# Patient Record
Sex: Female | Born: 1935
Health system: Southern US, Community
[De-identification: ages and names within clinical notes are randomized; demographics above are authoritative.]

## PROBLEM LIST (undated history)

## (undated) DIAGNOSIS — R002 Palpitations: Secondary | ICD-10-CM

## (undated) DIAGNOSIS — D369 Benign neoplasm, unspecified site: Secondary | ICD-10-CM

## (undated) DIAGNOSIS — E7800 Pure hypercholesterolemia, unspecified: Secondary | ICD-10-CM

## (undated) DIAGNOSIS — K589 Irritable bowel syndrome without diarrhea: Secondary | ICD-10-CM

## (undated) DIAGNOSIS — I729 Aneurysm of unspecified site: Secondary | ICD-10-CM

## (undated) DIAGNOSIS — H269 Unspecified cataract: Secondary | ICD-10-CM

## (undated) DIAGNOSIS — E559 Vitamin D deficiency, unspecified: Secondary | ICD-10-CM

## (undated) DIAGNOSIS — F329 Major depressive disorder, single episode, unspecified: Secondary | ICD-10-CM

## (undated) DIAGNOSIS — K5730 Diverticulosis of large intestine without perforation or abscess without bleeding: Secondary | ICD-10-CM

## (undated) DIAGNOSIS — I10 Essential (primary) hypertension: Secondary | ICD-10-CM

## (undated) DIAGNOSIS — K219 Gastro-esophageal reflux disease without esophagitis: Secondary | ICD-10-CM

## (undated) DIAGNOSIS — F0390 Unspecified dementia without behavioral disturbance: Secondary | ICD-10-CM

## (undated) HISTORY — DX: Unspecified cataract: H26.9

## (undated) HISTORY — DX: Gastro-esophageal reflux disease without esophagitis: K21.9

## (undated) HISTORY — DX: Major depressive disorder, single episode, unspecified: F32.9

## (undated) HISTORY — PX: APPENDECTOMY: SHX54

## (undated) HISTORY — DX: Vitamin D deficiency, unspecified: E55.9

## (undated) HISTORY — PX: OTHER SURGICAL HISTORY: SHX169

## (undated) HISTORY — DX: Benign neoplasm, unspecified site: D36.9

## (undated) HISTORY — PX: CATARACT EXTRACTION: SUR2

## (undated) HISTORY — DX: Irritable bowel syndrome without diarrhea: K58.9

## (undated) HISTORY — DX: Essential (primary) hypertension: I10

## (undated) HISTORY — DX: Aneurysm of unspecified site: I72.9

## (undated) HISTORY — DX: Pure hypercholesterolemia, unspecified: E78.00

## (undated) HISTORY — DX: Diverticulosis of large intestine without perforation or abscess without bleeding: K57.30

---

## 1991-04-06 HISTORY — PX: EXPLORATORY LAPAROTOMY: SUR591

## 1992-03-21 ENCOUNTER — Encounter: Payer: Self-pay | Admitting: Internal Medicine

## 1998-06-30 ENCOUNTER — Other Ambulatory Visit: Admission: RE | Admit: 1998-06-30 | Discharge: 1998-06-30 | Payer: Self-pay | Admitting: Nurse Practitioner

## 1998-08-06 ENCOUNTER — Encounter: Admission: RE | Admit: 1998-08-06 | Discharge: 1998-11-04 | Payer: Self-pay | Admitting: Rheumatology

## 1999-03-11 ENCOUNTER — Encounter: Payer: Self-pay | Admitting: Internal Medicine

## 2002-10-19 ENCOUNTER — Encounter: Payer: Self-pay | Admitting: Internal Medicine

## 2002-10-19 ENCOUNTER — Inpatient Hospital Stay (HOSPITAL_COMMUNITY): Admission: AD | Admit: 2002-10-19 | Discharge: 2002-10-21 | Payer: Self-pay | Admitting: Internal Medicine

## 2004-05-25 ENCOUNTER — Ambulatory Visit: Payer: Self-pay | Admitting: Internal Medicine

## 2004-09-07 ENCOUNTER — Ambulatory Visit: Payer: Self-pay | Admitting: Endocrinology

## 2004-09-25 ENCOUNTER — Ambulatory Visit: Payer: Self-pay | Admitting: Internal Medicine

## 2004-10-09 ENCOUNTER — Ambulatory Visit: Payer: Self-pay | Admitting: Endocrinology

## 2005-06-17 ENCOUNTER — Ambulatory Visit: Payer: Self-pay | Admitting: Internal Medicine

## 2005-06-18 ENCOUNTER — Encounter (INDEPENDENT_AMBULATORY_CARE_PROVIDER_SITE_OTHER): Payer: Self-pay | Admitting: *Deleted

## 2005-06-18 ENCOUNTER — Encounter: Admission: RE | Admit: 2005-06-18 | Discharge: 2005-06-18 | Payer: Self-pay | Admitting: Cardiology

## 2005-08-06 ENCOUNTER — Ambulatory Visit: Payer: Self-pay | Admitting: Internal Medicine

## 2006-02-04 ENCOUNTER — Ambulatory Visit (HOSPITAL_COMMUNITY): Admission: RE | Admit: 2006-02-04 | Discharge: 2006-02-04 | Payer: Self-pay | Admitting: Internal Medicine

## 2006-02-04 ENCOUNTER — Encounter: Payer: Self-pay | Admitting: Internal Medicine

## 2006-02-14 ENCOUNTER — Ambulatory Visit: Payer: Self-pay | Admitting: Internal Medicine

## 2006-02-22 ENCOUNTER — Ambulatory Visit (HOSPITAL_COMMUNITY): Admission: RE | Admit: 2006-02-22 | Discharge: 2006-02-22 | Payer: Self-pay | Admitting: Internal Medicine

## 2006-02-22 ENCOUNTER — Encounter: Payer: Self-pay | Admitting: Internal Medicine

## 2006-03-31 ENCOUNTER — Ambulatory Visit: Payer: Self-pay | Admitting: Internal Medicine

## 2006-03-31 ENCOUNTER — Ambulatory Visit: Payer: Self-pay | Admitting: Endocrinology

## 2006-04-28 ENCOUNTER — Ambulatory Visit: Payer: Self-pay | Admitting: Endocrinology

## 2006-07-28 ENCOUNTER — Ambulatory Visit: Payer: Self-pay | Admitting: Endocrinology

## 2006-07-28 LAB — CONVERTED CEMR LAB
Albumin: 4 g/dL (ref 3.5–5.2)
Basophils Absolute: 0 10*3/uL (ref 0.0–0.1)
Cholesterol: 232 mg/dL (ref 0–200)
Direct LDL: 119.3 mg/dL
Eosinophils Absolute: 0.2 10*3/uL (ref 0.0–0.6)
GFR calc non Af Amer: 88 mL/min
HCT: 38.4 % (ref 36.0–46.0)
HDL: 87.1 mg/dL (ref >39.0)
Hemoglobin: 12.9 g/dL (ref 12.0–15.0)
Hgb A1c MFr Bld: 5.9 % (ref 4.6–6.0)
MCHC: 33.6 g/dL (ref 30.0–36.0)
MCV: 82.3 fL (ref 78.0–100.0)
Monocytes Absolute: 0.6 10*3/uL (ref 0.2–0.7)
Neutrophils Relative %: 64.6 % (ref 43.0–77.0)
Potassium: 4.2 meq/L (ref 3.5–5.1)
RBC: 4.66 M/uL (ref 3.87–5.11)
Sodium: 136 meq/L (ref 135–145)
Total CHOL/HDL Ratio: 2.7

## 2006-12-06 DIAGNOSIS — R1013 Epigastric pain: Secondary | ICD-10-CM

## 2006-12-06 DIAGNOSIS — K3189 Other diseases of stomach and duodenum: Secondary | ICD-10-CM

## 2006-12-06 DIAGNOSIS — K5730 Diverticulosis of large intestine without perforation or abscess without bleeding: Secondary | ICD-10-CM

## 2006-12-06 DIAGNOSIS — K219 Gastro-esophageal reflux disease without esophagitis: Secondary | ICD-10-CM

## 2006-12-06 DIAGNOSIS — K589 Irritable bowel syndrome without diarrhea: Secondary | ICD-10-CM

## 2006-12-06 DIAGNOSIS — R7309 Other abnormal glucose: Secondary | ICD-10-CM

## 2006-12-06 DIAGNOSIS — R109 Unspecified abdominal pain: Secondary | ICD-10-CM | POA: Insufficient documentation

## 2006-12-06 DIAGNOSIS — F329 Major depressive disorder, single episode, unspecified: Secondary | ICD-10-CM

## 2006-12-06 DIAGNOSIS — F418 Other specified anxiety disorders: Secondary | ICD-10-CM | POA: Insufficient documentation

## 2006-12-06 DIAGNOSIS — F3289 Other specified depressive episodes: Secondary | ICD-10-CM

## 2006-12-06 HISTORY — DX: Gastro-esophageal reflux disease without esophagitis: K21.9

## 2006-12-06 HISTORY — DX: Irritable bowel syndrome, unspecified: K58.9

## 2006-12-06 HISTORY — DX: Major depressive disorder, single episode, unspecified: F32.9

## 2006-12-06 HISTORY — DX: Diverticulosis of large intestine without perforation or abscess without bleeding: K57.30

## 2006-12-06 HISTORY — DX: Other specified depressive episodes: F32.89

## 2007-03-16 ENCOUNTER — Ambulatory Visit: Payer: Self-pay | Admitting: Internal Medicine

## 2007-07-27 ENCOUNTER — Ambulatory Visit: Payer: Self-pay | Admitting: Endocrinology

## 2007-07-27 DIAGNOSIS — I10 Essential (primary) hypertension: Secondary | ICD-10-CM

## 2007-07-27 DIAGNOSIS — E7800 Pure hypercholesterolemia, unspecified: Secondary | ICD-10-CM | POA: Insufficient documentation

## 2007-07-27 HISTORY — DX: Essential (primary) hypertension: I10

## 2007-07-27 HISTORY — DX: Pure hypercholesterolemia, unspecified: E78.00

## 2007-07-31 ENCOUNTER — Encounter: Payer: Self-pay | Admitting: Endocrinology

## 2007-08-02 ENCOUNTER — Telehealth (INDEPENDENT_AMBULATORY_CARE_PROVIDER_SITE_OTHER): Payer: Self-pay | Admitting: *Deleted

## 2008-01-01 ENCOUNTER — Telehealth: Payer: Self-pay | Admitting: Internal Medicine

## 2008-01-02 ENCOUNTER — Ambulatory Visit: Payer: Self-pay | Admitting: Internal Medicine

## 2008-02-06 ENCOUNTER — Ambulatory Visit: Payer: Self-pay | Admitting: Internal Medicine

## 2008-02-06 ENCOUNTER — Encounter: Payer: Self-pay | Admitting: Internal Medicine

## 2008-02-08 ENCOUNTER — Encounter: Payer: Self-pay | Admitting: Internal Medicine

## 2008-02-26 ENCOUNTER — Telehealth: Payer: Self-pay | Admitting: Internal Medicine

## 2008-04-16 ENCOUNTER — Encounter: Payer: Self-pay | Admitting: Internal Medicine

## 2008-05-23 ENCOUNTER — Ambulatory Visit: Payer: Self-pay | Admitting: Endocrinology

## 2008-05-23 DIAGNOSIS — R002 Palpitations: Secondary | ICD-10-CM

## 2008-05-23 DIAGNOSIS — E559 Vitamin D deficiency, unspecified: Secondary | ICD-10-CM | POA: Insufficient documentation

## 2008-05-23 HISTORY — DX: Palpitations: R00.2

## 2008-05-23 HISTORY — DX: Vitamin D deficiency, unspecified: E55.9

## 2008-07-03 ENCOUNTER — Telehealth: Payer: Self-pay | Admitting: Internal Medicine

## 2008-07-29 ENCOUNTER — Ambulatory Visit: Payer: Self-pay | Admitting: Endocrinology

## 2008-07-29 LAB — CONVERTED CEMR LAB
ALT: 23 units/L (ref 0–35)
BUN: 8 mg/dL (ref 6–23)
Basophils Absolute: 0 10*3/uL (ref 0.0–0.1)
Bilirubin, Direct: 0.1 mg/dL (ref 0.0–0.3)
Cholesterol: 253 mg/dL — ABNORMAL HIGH (ref 0–200)
Creatinine, Ser: 0.6 mg/dL (ref 0.4–1.2)
Direct LDL: 131.2 mg/dL
Eosinophils Relative: 1.6 % (ref 0.0–5.0)
GFR calc non Af Amer: 104.42 mL/min (ref >60)
Glucose, Bld: 92 mg/dL (ref 70–99)
HDL: 85.9 mg/dL (ref >39.00)
Hemoglobin, Urine: NEGATIVE
Lymphs Abs: 1 10*3/uL (ref 0.7–4.0)
MCV: 81.6 fL (ref 78.0–100.0)
Monocytes Absolute: 0.5 10*3/uL (ref 0.1–1.0)
Monocytes Relative: 8.7 % (ref 3.0–12.0)
Neutrophils Relative %: 71.1 % (ref 43.0–77.0)
Nitrite: NEGATIVE
Platelets: 277 10*3/uL (ref 150.0–400.0)
RDW: 13.3 % (ref 11.5–14.6)
Specific Gravity, Urine: 1.01 (ref 1.000–1.030)
Total Bilirubin: 0.6 mg/dL (ref 0.3–1.2)
Total Protein, Urine: NEGATIVE mg/dL
Urine Glucose: NEGATIVE mg/dL
VLDL: 13.2 mg/dL (ref 0.0–40.0)
WBC: 5.3 10*3/uL (ref 4.5–10.5)
pH: 6.5 (ref 5.0–8.0)

## 2008-07-31 ENCOUNTER — Telehealth: Payer: Self-pay | Admitting: Internal Medicine

## 2008-08-02 ENCOUNTER — Telehealth: Payer: Self-pay | Admitting: Internal Medicine

## 2008-08-07 ENCOUNTER — Telehealth (INDEPENDENT_AMBULATORY_CARE_PROVIDER_SITE_OTHER): Payer: Self-pay | Admitting: *Deleted

## 2008-08-12 ENCOUNTER — Telehealth: Payer: Self-pay | Admitting: Endocrinology

## 2008-08-13 ENCOUNTER — Telehealth (INDEPENDENT_AMBULATORY_CARE_PROVIDER_SITE_OTHER): Payer: Self-pay | Admitting: *Deleted

## 2008-09-13 ENCOUNTER — Encounter: Payer: Self-pay | Admitting: Internal Medicine

## 2008-10-21 ENCOUNTER — Encounter: Payer: Self-pay | Admitting: Internal Medicine

## 2009-01-14 ENCOUNTER — Encounter: Payer: Self-pay | Admitting: Internal Medicine

## 2009-01-24 ENCOUNTER — Encounter (INDEPENDENT_AMBULATORY_CARE_PROVIDER_SITE_OTHER): Payer: Self-pay | Admitting: *Deleted

## 2009-03-27 ENCOUNTER — Encounter: Payer: Self-pay | Admitting: Internal Medicine

## 2009-04-09 ENCOUNTER — Encounter: Payer: Self-pay | Admitting: Endocrinology

## 2009-07-25 ENCOUNTER — Encounter (INDEPENDENT_AMBULATORY_CARE_PROVIDER_SITE_OTHER): Payer: Self-pay | Admitting: Internal Medicine

## 2009-07-25 ENCOUNTER — Observation Stay (HOSPITAL_COMMUNITY): Admission: EM | Admit: 2009-07-25 | Discharge: 2009-07-26 | Payer: Self-pay | Admitting: Emergency Medicine

## 2009-07-25 ENCOUNTER — Encounter: Payer: Self-pay | Admitting: Endocrinology

## 2009-07-25 ENCOUNTER — Ambulatory Visit: Payer: Self-pay | Admitting: Cardiology

## 2009-07-30 ENCOUNTER — Ambulatory Visit: Payer: Self-pay | Admitting: Endocrinology

## 2009-07-30 LAB — CONVERTED CEMR LAB: Hgb A1c MFr Bld: 5.7 % (ref 4.6–6.5)

## 2009-08-04 ENCOUNTER — Telehealth: Payer: Self-pay | Admitting: Internal Medicine

## 2009-08-13 ENCOUNTER — Telehealth: Payer: Self-pay | Admitting: Internal Medicine

## 2009-08-18 ENCOUNTER — Ambulatory Visit: Payer: Self-pay | Admitting: Internal Medicine

## 2009-08-18 ENCOUNTER — Encounter (INDEPENDENT_AMBULATORY_CARE_PROVIDER_SITE_OTHER): Payer: Self-pay | Admitting: *Deleted

## 2009-08-18 DIAGNOSIS — R109 Unspecified abdominal pain: Secondary | ICD-10-CM | POA: Insufficient documentation

## 2009-08-18 DIAGNOSIS — R1033 Periumbilical pain: Secondary | ICD-10-CM

## 2009-09-09 ENCOUNTER — Ambulatory Visit: Payer: Self-pay | Admitting: Internal Medicine

## 2009-09-09 DIAGNOSIS — D369 Benign neoplasm, unspecified site: Secondary | ICD-10-CM

## 2009-09-09 HISTORY — DX: Benign neoplasm, unspecified site: D36.9

## 2009-09-09 LAB — HM COLONOSCOPY

## 2009-09-10 ENCOUNTER — Encounter: Payer: Self-pay | Admitting: Internal Medicine

## 2009-09-17 ENCOUNTER — Telehealth: Payer: Self-pay | Admitting: Internal Medicine

## 2009-09-19 ENCOUNTER — Telehealth: Payer: Self-pay | Admitting: Internal Medicine

## 2009-10-13 ENCOUNTER — Telehealth: Payer: Self-pay | Admitting: Internal Medicine

## 2009-10-24 ENCOUNTER — Encounter: Payer: Self-pay | Admitting: Internal Medicine

## 2010-03-31 ENCOUNTER — Ambulatory Visit
Admission: RE | Admit: 2010-03-31 | Discharge: 2010-03-31 | Payer: Self-pay | Source: Home / Self Care | Attending: Internal Medicine | Admitting: Internal Medicine

## 2010-03-31 DIAGNOSIS — N390 Urinary tract infection, site not specified: Secondary | ICD-10-CM

## 2010-03-31 DIAGNOSIS — R6883 Chills (without fever): Secondary | ICD-10-CM | POA: Insufficient documentation

## 2010-04-01 ENCOUNTER — Encounter: Payer: Self-pay | Admitting: Internal Medicine

## 2010-04-01 LAB — CONVERTED CEMR LAB
Bilirubin Urine: NEGATIVE
Ketones, ur: NEGATIVE mg/dL
Total Protein, Urine: NEGATIVE mg/dL
Urine Glucose: NEGATIVE mg/dL
Urobilinogen, UA: 0.2 (ref 0.0–1.0)

## 2010-05-03 LAB — CONVERTED CEMR LAB
Albumin: 4 g/dL (ref 3.5–5.2)
BUN: 7 mg/dL (ref 6–23)
Calcium, Total (PTH): 9.6 mg/dL (ref 8.4–10.5)
Calcium: 9.7 mg/dL (ref 8.4–10.5)
Creatinine, Ser: 0.7 mg/dL (ref 0.4–1.2)
Crystals: NEGATIVE
Direct LDL: 144.4 mg/dL
Eosinophils Absolute: 0.1 10*3/uL (ref 0.0–0.7)
Eosinophils Relative: 2.2 % (ref 0.0–5.0)
GFR calc Af Amer: 106 mL/min
Glucose, Bld: 93 mg/dL (ref 70–99)
HCT: 39.6 % (ref 36.0–46.0)
HDL: 96.5 mg/dL (ref >39.0)
Hemoglobin, Urine: NEGATIVE
Hgb A1c MFr Bld: 5.9 % (ref 4.6–6.0)
MCV: 83 fL (ref 78.0–100.0)
Monocytes Absolute: 0.5 10*3/uL (ref 0.1–1.0)
PTH: 43.7 pg/mL (ref 14.0–72.0)
Platelets: 310 10*3/uL (ref 150–400)
RDW: 13.4 % (ref 11.5–14.6)
Sodium: 139 meq/L (ref 135–145)
TSH: 0.64 microintl units/mL (ref 0.35–5.50)
Total Protein: 7 g/dL (ref 6.0–8.3)
Triglycerides: 52 mg/dL (ref 0–149)
Urine Glucose: NEGATIVE mg/dL
Urobilinogen, UA: 0.2 (ref 0.0–1.0)

## 2010-05-05 NOTE — Assessment & Plan Note (Signed)
Summary: routine f/u IBS...wants refills on hydrocodone...Kara Kitchenneeds colon...   History of Present Illness Visit Type: Follow-up Visit Primary GI MD: [name] MD Primary Provider: Romero Belling, MD Chief Complaint: follow up IBS, pt continues to have periumbilical pain and requests refill of hydrocodone.  [name] also due for colonoscopy which she would like to discuss History of Present Illness:   76 year old white female with chronic periumbilical abdominal pain attributed to irritable bowel syndrome. Last colonoscopy in 2000 showed redundant colon. Her pain is worse after having a bowel movement and Maalox toward a she denies being constipated. The pain is not related to meals or physical activity. Small bowel capsule endoscopy was negative. Upper endoscopy November 2009 showed gastritis. CT angiogram in 2007 did not show any vascular compromise. CT Scan of the abdomen in 1989 and again in 2004 for diverticulosis. Her tissue transglutaminase in 2009 was negative he await has remained stable she has not had any abdominal surgeries except for appendectomy   GI Review of Systems    Reports abdominal pain.     Location of  Abdominal pain: periumbilical.    Denies acid reflux, belching, bloating, chest pain, dysphagia with liquids, dysphagia with solids, heartburn, loss of appetite, nausea, vomiting, vomiting blood, weight loss, and  weight gain.      Reports irritable bowel syndrome.     Denies anal fissure, black tarry stools, change in bowel habit, constipation, diarrhea, diverticulosis, fecal incontinence, heme positive stool, hemorrhoids, jaundice, light color stool, liver problems, rectal bleeding, and  rectal pain. Preventive Screening-Counseling & Management  Alcohol-Tobacco     Smoking Status: never    Current Medications (verified): 1)  Synthroid 75 Mcg  Tabs (Levothyroxine Sodium) .... Take 1 Tablet By Mouth Once A Day 2)  Xanax 0.25 Mg  Tabs (Alprazolam) .... Take 1 Tablet By Mouth  At Bedtime 3)  Prilosec 20 Mg  Cpdr (Omeprazole) .... Take 1 Tablet By Mouth Once A Day 4)  Fish Oil   Oil (Fish Oil) .... Take 4 By Mouth Qd 5)  Calcium 500 500 Mg  Tabs (Calcium Carbonate) .... Take 2 By Mouth Qd 6)  Hydrocodone-Acetaminophen 5-500 Mg Tabs (Hydrocodone-Acetaminophen) .... Take 1-2 Tablets By Mouth As Needed For Pain. Must Have Office Visit For Further Refills 7)  Amlodipine Besylate 5 Mg Tabs (Amlodipine Besylate) .... By Mouth Once Daily 8)  Aspir-Low 81 Mg Tbec (Aspirin) .... Once Daily 9)  Vitamin D 1000 Unit Tabs (Cholecalciferol) .... Take 1 Tablet By Mouth Once A Day  Allergies (verified): No Known Drug Allergies  Past History:  Past Medical History: Reviewed history from 07/27/2007 and no changes required. Current Problems:  GERD (ICD-530.81) DIVERTICULOSIS, COLON (ICD-562.10) DEPRESSION--dr cottle(ICD-311) HYPERGLYCEMIA (ICD-790.29) DYSPEPSIA (ICD-536.8) ABDOMINAL PAIN, CHRONIC (ICD-789.00) HRT (ICD-V07.4) IRRITABLE BOWEL SYNDROME (ICD-564.1) osteoporosis--rx refused  Past Surgical History: Reviewed history from 04/03/2007 and no changes required. Ab. [place]- 02/1992 Appendectomy Colonoscopy-03/11/1999 Flex sig-01/1994 Exploratory Laparotomy in 1993 secondary to abd. pain  Family History: Reviewed history from 01/02/2008 and no changes required. no cancer No FH of Colon Cancer: Family History of Diabetes: Brother Family History of Heart Disease: Mother  Social History: divorced x many years unemployed Alcohol Use - no Illicit Drug Use - no Patient has never smoked.  Daily Caffeine Use 1 cup of 1/2 caf coffee Smoking Status:  never  Review of Systems  The patient denies allergy/sinus, anemia, anxiety-new, arthritis/joint pain, back pain, blood in urine, breast changes/lumps, confusion, cough, coughing up blood, depression-new, fainting, fatigue, fever, headaches-new,  hearing problems, heart murmur, heart rhythm changes, itching, menstrual  pain, muscle pains/cramps, night sweats, nosebleeds, pregnancy symptoms, shortness of breath, skin rash, sleeping problems, sore throat, swelling of feet/legs, swollen lymph glands, thirst - excessive, urination - excessive, urination changes/pain, urine leakage, vision changes, and voice change.         Pertinent positive and negative review of systems were noted in the above HPI. All other ROS was otherwise negative.   Vital Signs:  Patient profile:   76 year old female Height:      64 inches Weight:      129 pounds BMI:     22.22 Pulse rate:   80 / minute Pulse rhythm:   regular BP sitting:   122 / 64  (left arm) Cuff size:   regular  Vitals Entered By: Francee Piccolo CMA Duncan Dull) (Aug 18, 2009 1:57 PM)  Physical Exam  General:  alert oriented no distress Eyes:  PERRLA, no icterus. Mouth:  No deformity or lesions, dentition normal. Neck:  Supple; no masses or thyromegaly. Lungs:  Clear throughout to auscultation. Heart:  Regular rate and rhythm; no murmurs, rubs,  or bruits. Abdomen:  soft abdomen. Nontender. Normoactive bowel sounds. No bruit. No distention. No palpable stool. Liver edge at costal margin Rectal:  small amount of soft Hemoccult negative stool Extremities:  No clubbing, cyanosis, edema or deformities noted. Skin:  Intact without significant lesions or rashes. Psych:  Alert and cooperative. Normal mood and affect.   Impression & Recommendations:  Problem # 1:  ABDOMINAL PAIN, PERIUMBILICAL (ICD-789.05) chronic periumbilical abdominal pain which is quite predictable and well localized for past 11 years without significant progression. Usually relieved by  Vicodin,she gives a nail that matter months. She has rather redundant colon and I wonder if her pain is related to partial colon emptying or adhesions. We will proceed with colonoscopy and possibly even a barium enema depending on the results of the of colonoscopy. She has known diverticulosis which might have  progressed. She will continue her probiotics. We will try at Trina Ao is a 8 micrograms twice a day  Problem # 2:  GERD (ICD-530.81)  Patient Instructions: 1)  Amitiza 8 micrograms p.o. b.i.d. 2)  Colonoscopy 3)  Continue probiotics 4)  Consider barium enema depending on results of colonoscopy 5)  Dr Romero Belling  Appended Document: routine f/u IBS...wants refills on hydrocodone...Kara Kitchenneeds colon...    Clinical Lists Changes  Medications: Changed medication from XANAX 0.25 MG  TABS (ALPRAZOLAM) Take 1 tablet by mouth at bedtime to XANAX 0.25 MG  TABS (ALPRAZOLAM) Take 1 tablet by mouth at bedtime MUST HAVE COLONOSCOPY FOR FURTHER REFILLS! - Signed Added new medication of MIRALAX   POWD (POLYETHYLENE GLYCOL 3350) As per prep  instructions. - Signed Added new medication of REGLAN 10 MG  TABS (METOCLOPRAMIDE HCL) As per prep instructions. - Signed Added new medication of DULCOLAX 5 MG  TBEC (BISACODYL) Day before procedure take 2 at 3pm and 2 at 8pm. - Signed Rx of XANAX 0.25 MG  TABS (ALPRAZOLAM) Take 1 tablet by mouth at bedtime MUST HAVE COLONOSCOPY FOR FURTHER REFILLS!;  #20 x 0;  Signed;  Entered by: Lamona Curl CMA (AAMA);  Authorized by: Hart Carwin MD;  Method used: [facility] then [facility]  [phone]*, [address] [place], [place]  [address], Ph: [phone] or [phone], Fax: [phone] Rx of MIRALAX   POWD (POLYETHYLENE GLYCOL 3350) As per prep  instructions.;  #255gm x 0;  Signed;  Entered by: Lamona Curl CMA (AAMA);  Authorized by: Hart Carwin MD;  Method used: Electronically to [facility]  [phone]*, [address] [place], [place]  [address], Ph: [phone] or [phone], Fax: [phone] Rx of REGLAN 10 MG  TABS (METOCLOPRAMIDE HCL) As per prep instructions.;  #2 x 0;  Signed;  Entered by: Lamona Curl CMA (AAMA);  Authorized by: Hart Carwin MD;  Method used: Electronically to [facility]  [phone]*, [address], [place], [place]  [address], Ph: [phone] or [phone], Fax: [phone] Rx of DULCOLAX 5 MG  TBEC (BISACODYL) Day before procedure take 2 at 3pm and 2 at 8pm.;  #4 x 0;  Signed;  Entered by: Lamona Curl CMA (AAMA);  Authorized by: Hart Carwin MD;  Method used: Electronically to [facility]  [phone]*, [address], [place], [place]  [address], Ph: [phone] or [phone], Fax: [phone]     Prescriptions: DULCOLAX 5 MG  TBEC (BISACODYL) Day before procedure take 2 at 3pm and 2 at 8pm.  #4 x 0   Entered by:   Lamona Curl CMA (AAMA)   Authorized by:   Hart Carwin MD   Signed by:   Lamona Curl CMA (AAMA) on 08/18/2009   Method used:   Electronically to        [facility]  [phone]* (retail)       [address] [place], [place]  [address]       Ph: [phone] or [phone]       Fax: [phone]   RxID:   [id number] REGLAN 10 MG  TABS (METOCLOPRAMIDE HCL) As per prep instructions.  #2 x 0   Entered by:   Lamona Curl CMA (AAMA)   Authorized by:   Hart Carwin MD   Signed by:   Lamona Curl CMA (AAMA) on 08/18/2009   Method used:   Electronically to        [facility]  [phone]* (retail)       [address] [place], [place]  [address]       Ph: [phone] or [phone]       Fax: [phone]   RxID:   [id number] MIRALAX   POWD (POLYETHYLENE GLYCOL 3350) As per prep  instructions.  #255gm x 0   Entered by:   Lamona Curl CMA (AAMA)   Authorized by:   Hart Carwin MD   Signed by:   Lamona Curl CMA (AAMA) on 08/18/2009   Method used:   Electronically to        [facility]  [phone]* (retail)       [address] [place], [place]  [address]       Ph: [phone] or [phone]       Fax: [phone]   RxID:   [id number] XANAX 0.25 MG  TABS (ALPRAZOLAM) Take 1 tablet by mouth at bedtime MUST HAVE COLONOSCOPY FOR FURTHER REFILLS!  #20 x 0   Entered by:    Lamona Curl CMA (AAMA)   Authorized by:   Hart Carwin MD   Signed by:   Lamona Curl CMA (AAMA) on 08/18/2009   Method used:   Printed then faxed to ...       [facility]  [address]  [phone]* (retail)       [address] [place]  [place], [place]  [address]       Ph: [phone] or [phone]       Fax: [phone]   RxID:   [id number]    Appended Document: routine f/u IBS...wants refills on hydrocodone...Kara Kitchenneeds colon...    Clinical Lists Changes  Orders: Added new Test order of Colonoscopy (Colon) - Signed      Appended Document: routine f/u IBS...wants refills on hydrocodone...Kara Kitchenneeds colon...  Clinical Lists Changes  Medications: Changed medication from XANAX 0.25 MG  TABS (ALPRAZOLAM) Take 1 tablet by mouth at bedtime MUST HAVE COLONOSCOPY FOR FURTHER REFILLS! to HYDROCODONE-ACETAMINOPHEN 5-500 MG TABS (HYDROCODONE-ACETAMINOPHEN) Take 1 tablet by mouth as needed severe abdominal pain. MUST HAVE COLONOSCOPY FOR FURTHER REFILLS - Signed Rx of HYDROCODONE-ACETAMINOPHEN 5-500 MG TABS (HYDROCODONE-ACETAMINOPHEN) Take 1 tablet by mouth as needed severe abdominal pain. MUST HAVE COLONOSCOPY FOR FURTHER REFILLS;  #20 x 0;  Signed;  Entered by: Lamona Curl CMA (AAMA);  Authorized by: Hart Carwin MD;  Method used: Printed then faxed to [facility]  [phone]*, [address], [place], [place]  [address], Ph: [phone] or [phone], Fax: [phone]    Prescriptions: HYDROCODONE-ACETAMINOPHEN 5-500 MG TABS (HYDROCODONE-ACETAMINOPHEN) Take 1 tablet by mouth as needed severe abdominal pain. MUST HAVE COLONOSCOPY FOR FURTHER REFILLS  #20 x 0   Entered by:   Lamona Curl CMA (AAMA)   Authorized by:   Hart Carwin MD   Signed by:   Lamona Curl CMA (AAMA) on 08/18/2009   Method used:   Printed then faxed to ...       [facility]  [phone]* (retail)       [address] [place], [place]  [address]       Ph: [phone] or  [phone]       Fax: [phone]   RxID:   [id number]  xanax prescription sent in error. I have asked pharmacy to d/c the prescription. I will send a new prescription for hydrocodone. Dottie Nelson-Smith CMA Duncan Dull)  Aug 18, 2009 5:14 PM

## 2010-05-05 NOTE — Progress Notes (Signed)
Summary: Triage   Phone Note From Other Clinic   Caller: Randie He[facility]  [facility]  [phone] Call For: Dr. Juanda Chance Summary of Call: They received a referral and would like to discuss for narcotic bowel. They normally detox the pt. and wants to know if she is aware of this Initial call taken by: Karna Christmas,  September 19, 2009 2:54 PM  Follow-up for Phone Call        Yes, I am aware of it. Mrs Hedtke gets  only # 20 Vicodin/month, so narcotic  bowel is not likely. Follow-up by: Hart Carwin MD,  September 20, 2009 9:14 PM  Additional Follow-up for Phone Call Additional follow up Details #1::        Victorino Dike also wants me to discuss detox w/pt. to make sure she is aware they will most likely be taking her off all her pain meds, replacing with other meds that may be more effective, but she wants me to make sure pt. is agreeable, before she makes the trip. Pt's # busy, I will try back later. Additional Follow-up by: Laureen Ochs LPN,  September 22, 2009 8:35 AM    Additional Follow-up for Phone Call Additional follow up Details #2::    Above discussed with pt., she states she will be willing to do the detox. "I am definately willing to try whatever they want me to do."  Victorino Dike is aware. They will contact her about her appt. today or tomorrow.  Follow-up by: Laureen Ochs LPN,  September 22, 2009 10:08 AM

## 2010-05-05 NOTE — Procedures (Signed)
Summary: Colonoscopy  Patient: Kara Davis Note: All result statuses are Final unless otherwise noted.  Tests: (1) Colonoscopy (COL)   COL Colonoscopy           DONE (C)     [facility]     [address]     [place], [place]  [address]           COLONOSCOPY PROCEDURE REPORT           PATIENT:  [name], [name]  MR#:  [id number]     BIRTHDATE:  Feb 13, 1935, 75 yrs. old  GENDER:  female     ENDOSCOPIST:  Hedwig Morton. Juanda Chance, MD     REF. BY:     PROCEDURE DATE:  09/09/2009     PROCEDURE:  Colonoscopy [address]     ASA CLASS:  Class II     INDICATIONS:  Routine Risk Screening last colon 2000 redundant     colon     hyperplastic polyp 1995     s/p expl lap 1993with finding of chronic vascular ischemia of     distal 2/3 of jejunum and proximal ileum     MEDICATIONS:   Versed 9 mg, Fentanyl 75 mcg           DESCRIPTION OF PROCEDURE:   After the risks benefits and     alternatives of the procedure were thoroughly explained, informed     consent was obtained.  Digital rectal exam was performed and     revealed no rectal masses.   The LB [id number] endoscope     was introduced through the anus and advanced to the cecum, which     was identified by both the appendix and ileocecal valve, without     limitations.  The quality of the prep was good, using MiraLax.     The instrument was then slowly withdrawn as the colon was fully     examined.     <<PROCEDUREIMAGES>>           FINDINGS:  A sessile polyp was found. 4 mm sessile polyp at 15 cm     removed Polyp was snared without cautery. Retrieval was successful     (see image8). snare polyp  Moderate diverticulosis was found     throughout the colon (see image7, image6, image1, and image2).     This was otherwise a normal examination of the colon (see image3,     image4, image5, and image9).   Retroflexed views in the rectum     revealed no abnormalities.    The scope was then withdrawn from     the patient and the procedure  completed.           COMPLICATIONS:  None     ENDOSCOPIC IMPRESSION:     1) Sessile polyp     2) Moderate diverticulosis throughout the colon     3) Otherwise normal examination     RECOMMENDATIONS:     1) Await pathology results           REPEAT EXAM:  In 10 year(s) for.           ______________________________     Hedwig Morton. Juanda Chance, MD           CC:           n.     REVISED:  09/09/2009 12:37 PM     eSIGNED:   Hedwig Morton. Shawnita Krizek at 09/09/2009 12:37 PM  Julio Sicks, [id number]  Note: An exclamation mark (!) indicates a result that was not dispersed into the flowsheet. Document Creation Date: 09/09/2009 12:37 PM _______________________________________________________________________  (1) Order result status: Final Collection or observation date-time: 09/09/2009 12:06 Requested date-time:  Receipt date-time:  Reported date-time:  Referring Physician:   Ordering Physician: [name] [id number]) Specimen Source:  Source: Launa Grill Order Number: [id number] Lab site:   Appended Document: Colonoscopy     Procedures Next Due Date:    Colonoscopy: 09/2014

## 2010-05-05 NOTE — Letter (Signed)
Summary: Patient Notice- Polyp Results  [facility]  [address] [place], [place] [address]   Phone: [phone]  Fax: [phone]        September 10, 2009 MRN: [id number]    [facility] [place] [address]  [place], [place]  [address]    Dear Ms. Heming,  I am pleased to inform you that the colon polyp(s) removed during your recent colonoscopy was (were) found to be benign (no cancer detected) upon pathologic examination.The polyp was adenomatous ( precancerous)  I recommend you have a repeat colonoscopy examination in 5_ years to look for recurrent polyps, as having colon polyps increases your risk for having recurrent polyps or even colon cancer in the future.  Should you develop new or worsening symptoms of abdominal pain, bowel habit changes or bleeding from the rectum or bowels, please schedule an evaluation with either your primary care physician or with me.  Additional information/recommendations:  _x_ No further action with gastroenterology is needed at this time. Please      follow-up with your primary care physician for your other healthcare      needs.  __ Please call [phone] to schedule a return visit to review your      situation.  We are arranging an appointment for You at [facility] in [facility] __ Continue treatment plan as outlined the day of your exam.  Please call us if you are having persistent problems or have questions about your condition that have not been fully answered at this time.  Sincerely,  Hart Carwin MD  This letter has been electronically signed by your physician.  Appended Document: Patient Notice- Polyp Results letter mailed.

## 2010-05-05 NOTE — Op Note (Signed)
Summary: Laparoscopy / [facility]  Laparoscopy / [place]   Imported By: Lennie Odor 09/19/2009 16:29:13  _____________________________________________________________________  External Attachment:    Type:   Image     Comment:   External Document

## 2010-05-05 NOTE — Miscellaneous (Signed)
Summary: Hydrocodone Rx  Clinical Lists Changes  Medications: Changed medication from HYDROCODONE-ACETAMINOPHEN 5-500 MG TABS (HYDROCODONE-ACETAMINOPHEN) Take 1-2 tablets by mouth as needed for pain. MUST HAVE OFFICE VISIT FOR FURTHER REFILLS to HYDROCODONE-ACETAMINOPHEN 5-500 MG TABS (HYDROCODONE-ACETAMINOPHEN) Take 1-2 tablets by mouth as needed for pain. MUST GET FURTHER REFILLS FROM DR [facility] - Signed Rx of HYDROCODONE-ACETAMINOPHEN 5-500 MG TABS (HYDROCODONE-ACETAMINOPHEN) Take 1-2 tablets by mouth as needed for pain. MUST GET FURTHER REFILLS FROM DR [facility];  #20 x 0;  Signed;  Entered by: Lamona Curl CMA (AAMA);  Authorized by: Hart Carwin MD;  Method used: Printed then [facility]ed to [facility]  [phone]*, [address], [place], [place]  [address], Ph: [phone] or [phone], Fax: [phone]    Prescriptions: HYDROCODONE-ACETAMINOPHEN 5-500 MG TABS (HYDROCODONE-ACETAMINOPHEN) Take 1-2 tablets by mouth as needed for pain. MUST GET FURTHER REFILLS FROM DR [facility]  #20 x 0   Entered by:   Lamona Curl CMA (AAMA)   Authorized by:   Hart Carwin MD   Signed by:   Lamona Curl CMA (AAMA) on 10/24/2009   Method used:   Printed then faxed to ...       [facility]  [phone]* (retail)       [address] [place], [place]  [address]       Ph: [phone] or [phone]       Fax: [phone]   RxID:   [id number]

## 2010-05-05 NOTE — Assessment & Plan Note (Signed)
Summary: YEARLY FU/ MEDICARE/ [facility]  #   Vital Signs:  Patient profile:   76 year old female Height:      64 inches (162.56 cm) Weight:      127.13 pounds (57.79 kg) BMI:     21.90 O2 Sat:      97 % on Room air Temp:     96.6 degrees F (35.89 degrees C) oral Pulse rate:   79 / minute BP sitting:   122 / 72  (left arm) Cuff size:   regular  Vitals Entered By: Josph Macho RMA (July 30, 2009 8:27 AM)  O2 Flow:  Room air CC: Yearly follow up/ [name] no longer taking Calcium, Dicyclomine, Serzone, Donnatal, or Simvastatin/ CF Is Patient Diabetic? No   Primary Provider:  Romero Belling, MD  CC:  Yearly follow up/ [name] no longer taking Calcium, Dicyclomine, Serzone, Donnatal, and or Simvastatin/ CF.  History of Present Illness: here for regular wellness examination.  she's feeling pretty well in general, and does not smoke.  alcohol is 3/year.   Current Medications (verified): 1)  Synthroid 75 Mcg  Tabs (Levothyroxine Sodium) .... Take 1 Tablet By Mouth Once A Day 2)  Xanax 0.25 Mg  Tabs (Alprazolam) .... Take 1 Tablet By Mouth At Bedtime 3)  Prilosec 20 Mg  Cpdr (Omeprazole) .... Take 1 Tablet By Mouth Once A Day 4)  Serzone 150mg  .... Take 1 Tablet By Mouth Once A Day 5)  Fish Oil   Oil (Fish Oil) .... Take 4 By Mouth Qd 6)  Calcium 500 500 Mg  Tabs (Calcium Carbonate) .... Take 2 By Mouth Qd 7)  Screening Mammography 8)  Hydrocodone-Acetaminophen 5-500 Mg Tabs (Hydrocodone-Acetaminophen) .... Take 1-2 Tablets By Mouth As Needed For Pain. Must Have Office Visit For Further Refills 9)  Dicyclomine Hcl 10 Mg Caps (Dicyclomine Hcl) .... Take Prn 10)  Donnatal  Tabs (Belladonna Alk-Phenobarbital) .... Take 1 Tablet By Mouth Three Times A Day Before Meals (Pharmacy-Please D/c Librax Rx...insurance Does Not Cover) 11)  Simvastatin 5 Mg Tabs (Simvastatin) .Marland Kitchen.. 1 Qhs 12)  Amlodipine Besylate 5 Mg Tabs (Amlodipine Besylate) .... By Mouth Once Daily 13)  Aspir-Low 81 Mg Tbec (Aspirin)  .... Once Daily 14)  Zolpidem Tartrate 10 Mg Tabs (Zolpidem Tartrate) .... By Mouth At Bedtime As Needed  Allergies (verified): No Known Drug Allergies  Family History: Reviewed history from 01/02/2008 and no changes required. no cancer No FH of Colon Cancer: Family History of Diabetes: Brother Family History of Heart Disease: Mother  Social History: Reviewed history from 01/01/2008 and no changes required. divorced x many years unemployed Alcohol Use - no Illicit Drug Use - no  Review of Systems       The patient complains of depression.  The patient denies weight loss, weight gain, vision loss, decreased hearing, syncope, prolonged cough, headaches, melena, hematochezia, severe indigestion/heartburn, hematuria, and suspicious skin lesions.         she had low-grade fever in the hospital--resolved.  chest pain is resolved.  she has chronic abdominal pain.  Physical Exam  General:  normal appearance.   Head:  head: no deformity eyes: no periorbital swelling, no proptosis external nose and ears are normal mouth: no lesion seen Neck:  Supple without thyroid enlargement or tenderness.  Breasts:  No tenderness, masses, nipple discharge, or skin abnormalities.  Lungs:  Clear to auscultation bilaterally. Normal respiratory effort.  Heart:  Regular rate and rhythm without murmurs or gallops noted. Normal S1,S2.  Abdomen:  abdomen is soft, nontender.  no hepatosplenomegaly.   not distended.  no hernia  Rectal:  normal external and internal exam.  heme neg  Genitalia:  Pelvic Exam:  External: normal female genitalia without lesions or masses        Vagina: normal without lesions or masses        Cervix: normal without lesions or masses  Adnexa: normal bimanual exam without masses or fullness        Uterus: normal by palpation        Pap smear: not performed Msk:  muscle bulk and strength are grossly normal.  no obvious joint swelling.   Extremities:  no deformity.  no ulcer  on the feet.  feet are of normal color and temp.  no edema  Neurologic:  cn 2-12 grossly intact.   readily moves all 4's.   sensation is intact to touch on the feet  Skin:  normal texture and temp.  no rash.  not diaphoretic  Cervical Nodes:  No significant adenopathy.  Additional Exam:  SEPARATE EVALUATION FOLLOWS--EACH PROBLEM HERE IS NEW, NOT RESPONDING TO TREATMENT, OR POSES SIGNIFICANT RISK TO THE PATIENT'S HEALTH: HISTORY OF THE PRESENT ILLNESS: the status of at least 3 ongoing medical problems is addressed today: pt was seen in er for chest pain.  she stopped her zocor pt says she takes vicodin only approx 2/month, which helps her pain she says the xanax does not help her anxiety PAST MEDICAL HISTORY reviewed and up to date today REVIEW OF SYSTEMS: denies depression and sob PHYSICAL EXAMINATION: dorsalis pedis intact bilat.  no carotid bruit gait is normal and steady does not appear anxious or depressed LAB/XRAY RESULTS: Cholesterol LDL    131.2 mg/dL Vitamin D ([id number])    [L]  26 ng/mL     IMPRESSION: dyslipidemia, needs increased rx vitamin-d deficiency, needs increased rx there is a drug interaction between the vicodin, xanax, and ambien PLAN: see instruction sheet   Impression & Recommendations:  Problem # 1:  ROUTINE GENERAL MEDICAL EXAM@HEALTH  CARE FACL (ICD-V70.0)  Medications Added to Medication List This Visit: 1)  Amlodipine Besylate 5 Mg Tabs (Amlodipine besylate) .... By mouth once daily 2)  Aspir-low 81 Mg Tbec (Aspirin) .... Once daily  Other Orders: T-Vitamin D (25-Hydroxy) [id number]) TLB-A1C / Hgb A1C (Glycohemoglobin) (83036-A1C) Est. Patient Level IV (33295) Est. Patient 65& > (18841)  Preventive Care Screening     pneumovax, mammogram, and dexa are refused 2011   Patient Instructions: 1)  continue same blood pressure medications 2)  call if you decide to take the zocor. 3)  please take only the xanax, and stop the zolpidem. 4)   please consider these measures for your health:  minimize alcohol.  keep firearms safely stored.  always use seat belts.  have working smoke alarms in your home.  see the dentist regularly.  never drive under the influence of alcohol or drugs (including prescription drugs).  those with fair skin should take precautions against the sun. 5)  please let me know what your wishes would be, if artificial life support measures should become necessary.  6)  we discussed code status.  pt requests full code, but would not want to be started or maintained on artificial life-support measures if there was not a reasonable chance of recovery 7)  it is critically important to prevent falling down (keep floor areas well-lit, dry, and free of loose objects) 8)  tests are being ordered for you today.  a few days after the test(s), please call [phone] to hear your test results. 9)  please let dr Juanda Chance know if you decide to have the colonoscopy 10)  (update: i left message on phone-tree:  double vitamin-d to 2000 units once daily). Prescriptions: PRILOSEC 20 MG  CPDR (OMEPRAZOLE) Take 1 tablet by mouth once a day  #90 x 3   Entered and Authorized by:   Minus Breeding MD   Signed by:   Minus Breeding MD on 07/30/2009   Method used:   Print then Give to Patient   RxID:   [id number] AMLODIPINE BESYLATE 5 MG TABS (AMLODIPINE BESYLATE) by mouth once daily  #90 x 3   Entered and Authorized by:   Minus Breeding MD   Signed by:   Minus Breeding MD on 07/30/2009   Method used:   Print then Give to Patient   RxID:   [id number] SYNTHROID 75 MCG  TABS (LEVOTHYROXINE SODIUM) Take 1 tablet by mouth once a day  #90 Tablet x 3   Entered and Authorized by:   Minus Breeding MD   Signed by:   Minus Breeding MD on 07/30/2009   Method used:   Print then Give to Patient   RxID:   [id number]

## 2010-05-05 NOTE — Progress Notes (Signed)
Summary: Appt. w/Dr.Drossman   Phone Note Outgoing Call Call back at [facility] Phone [phone] Call back at Work Phone [phone]   Call placed by: Laureen Ochs LPN,  September 17, 2009 8:58 AM Summary of Call: Per Dr.Brodie, pt. needs an appt. w/Dr.Drossman for eval. of chronic abd. pain. I faxed records to [facility] on 09-11-09, waiting on callback w/appt. Initial call taken by: Laureen Ochs LPN,  September 17, 2009 9:05 AM  Follow-up for Phone Call        I spoke with Wyatt Mage, the pt's records are under review, she will notify the patient and our office when pt. is scheduled. Pt. is aware. Pt. instructed to call back as needed.   Follow-up by: Laureen Ochs LPN,  September 17, 2009 3:31 PM  Additional Follow-up for Phone Call Additional follow up Details #1::        Victorino Dike, [phone] will be contacting pt. about her appt. this week.  Additional Follow-up by: Laureen Ochs LPN,  September 22, 2009 10:09 AM    Additional Follow-up for Phone Call Additional follow up Details #2::    Still no appt., Victorino Dike states she will contact pt. with appt. and fax [place] the information. Follow-up by: Laureen Ochs LPN,  September 25, 2009 11:08 AM  Additional Follow-up for Phone Call Additional follow up Details #3:: Details for Additional Follow-up Action Taken: No appt. yet, Victorino Dike states she will notify pt. when appt. is scheduled. Mrs. Arps updated. Laureen Ochs LPN  October 02, 2009 11:06 AM    Appended Document: Appt. w/Dr.Drossman I have left a msg. for Victorino Dike to please call with pt. appt. information. Mrs.Biermann is aware.  Appended Document: Appt. w/Dr.Drossman Per Mrs.Krawczyk, her appt. w/Dr.Drossman is scheduled for 12-31-09 at 3:30pm.

## 2010-05-05 NOTE — Letter (Signed)
Summary: Nasal Congestion & Sinus Pressure/[facility]  Nasal Congestion & Sinus Pressure/[facility]   Imported By: Sherian Rein 04/15/2009 10:28:56  _____________________________________________________________________  External Attachment:    Type:   Image     Comment:   External Document

## 2010-05-05 NOTE — Letter (Signed)
Summary: [facility] Instructions  [facility]  [address] [place], [place] [address]   Phone: [phone]  Fax: [phone]       Kara Davis    74-14-1936    MRN: [id number]       Procedure Day /Date: 09/09/2009 Tuesday      Arrival Time: 10:30am     Procedure Time: 11:30am     Location of Procedure:                    [facility] (4th Floor)   PREPARATION FOR COLONOSCOPY WITH MIRALAX  Starting 5 days prior to your procedure 09/04/2009 do not eat nuts, seeds, popcorn, corn, beans, peas,  salads, or any raw vegetables.  Do not take any fiber supplements (e.g. Metamucil, Citrucel, and Benefiber). ____________________________________________________________________________________________________   THE DAY BEFORE YOUR PROCEDURE         DATE: 09/08/2009  DAY: Monday  1   Drink clear liquids the entire day-NO SOLID FOOD  2   Do not drink anything colored red or purple.  Avoid juices with pulp.  No orange juice.  3   Drink at least 64 oz. (8 glasses) of fluid/clear liquids during the day to prevent dehydration and help the prep work efficiently.  CLEAR LIQUIDS INCLUDE: Water Jello Ice Popsicles Tea (sugar ok, no milk/cream) Powdered fruit flavored drinks Coffee (sugar ok, no milk/cream) Gatorade Juice: apple, white grape, white cranberry  Lemonade Clear bullion, consomm, broth Carbonated beverages (any kind) Strained chicken noodle soup Hard Candy  4   Mix the entire bottle of Miralax with 64 oz. of Gatorade/Powerade in the morning and put in the refrigerator to chill.  5   At 3:00 pm take 2 Dulcolax/Bisacodyl tablets.  6   At 4:30 pm take one Reglan/Metoclopramide tablet.  7  Starting at 5:00 pm drink one 8 oz glass of the Miralax mixture every 15-20 minutes until you have finished drinking the entire 64 oz.  You should finish drinking prep around 7:30 or 8:00 pm.  8   If you are nauseated, you may take the 2nd Reglan/Metoclopramide tablet  at 6:30 pm.        9    At 8:00 pm take 2 more DULCOLAX/Bisacodyl tablets.     THE DAY OF YOUR PROCEDURE      DATE: 09/09/2009  DAY: Tuesday  You may drink clear liquids until  9:30am  (2 HOURS BEFORE PROCEDURE).   MEDICATION INSTRUCTIONS  Unless otherwise instructed, you should take regular prescription medications with a small sip of water as early as possible the morning of your procedure.        OTHER INSTRUCTIONS  You will need a responsible adult at least 74 years of age to accompany you and drive you home.   This person must remain in the waiting room during your procedure.  Wear loose fitting clothing that is easily removed.  Leave jewelry and other valuables at home.  However, you may wish to bring a book to read or an iPod/MP3 player to listen to music as you wait for your procedure to start.  Remove all body piercing jewelry and leave at home.  Total time from sign-in until discharge is approximately 2-3 hours.  You should go home directly after your procedure and rest.  You can resume normal activities the day after your procedure.  The day of your procedure you should not:   Drive   Make legal decisions  Operate machinery   Drink alcohol   Return to work  You will receive specific instructions about eating, activities and medications before you leave.   The above instructions have been reviewed and explained to me by   Harlow Mares CMA Duncan Dull)  Aug 18, 2009 2:39 PM      I fully understand and can verbalize these instructions _____________________________ Date 08/18/2009

## 2010-05-05 NOTE — Progress Notes (Signed)
Summary: Schedule Colonoscopy   Phone Note Outgoing Call Call back at Home Phone [phone]   Call placed by: Harlow Mares CMA Duncan Dull),  Aug 04, 2009 3:13 PM Call placed to: Patient Summary of Call: Left message on patients machine to call back, patient due for colonoscopy Initial call taken by: Harlow Mares CMA Duncan Dull),  Aug 04, 2009 3:13 PM  Follow-up for Phone Call        Pt. returned call. She is trying to arrange her sch'd for a driver. Will call back to sch'd an appt. Follow-up by: Harlow Mares CMA Duncan Dull),  Aug 13, 2009 9:03 AM

## 2010-05-05 NOTE — Progress Notes (Signed)
Summary: meds requests  Medications Added HYDROCODONE-ACETAMINOPHEN 5-500 MG TABS (HYDROCODONE-ACETAMINOPHEN) Take 1 tablet by mouth as needed severe abdominal pain.       Phone Note Call from Patient Call back at Work Phone [phone]   Caller: Patient Call For: Dr. Juanda Chance Reason for Call: Talk to Nurse Summary of Call: would like meds for stomach spasms called in and a refill for Hydrocodon... [facility] [address], [phone] Initial call taken by: Vallarie Mare,  September 19, 2009 8:29 AM  Follow-up for Phone Call        Dr Juanda Chance- Do you want me to give another refill on hydrocodone or does she now need to see Dr Almyra Deforest for that?  Additional Follow-up for Phone Call Additional follow up Details #1::        She may have # 20/ month. until she sees Dr Almyra Deforest , which may take several months. Also, could You, please scan into EMR an Operative report of expl. laparotomy where they resected part of her jejunum? Additional Follow-up by: Hart Carwin MD,  September 19, 2009 1:04 PM    Additional Follow-up for Phone Call Additional follow up Details #2::    Prescription sent by fax. I will look into surgeries patient has had and try to find something to enter into EMR. Follow-up by: Lamona Curl CMA (AAMA),  September 19, 2009 1:50 PM  New/Updated Medications: HYDROCODONE-ACETAMINOPHEN 5-500 MG TABS (HYDROCODONE-ACETAMINOPHEN) Take 1 tablet by mouth as needed severe abdominal pain. Prescriptions: HYDROCODONE-ACETAMINOPHEN 5-500 MG TABS (HYDROCODONE-ACETAMINOPHEN) Take 1 tablet by mouth as needed severe abdominal pain.  #20 x 0   Entered by:   Lamona Curl CMA (AAMA)   Authorized by:   Hart Carwin MD   Signed by:   Lamona Curl CMA (AAMA) on 09/19/2009   Method used:   Printed then faxed to ...       [facility]  [phone]* (retail)       [address] [place], [place]  [address]       Ph: [phone] or [phone]       Fax: [phone]   RxID:    [id number]

## 2010-05-05 NOTE — Progress Notes (Signed)
Summary: TRIAGE  Medications Added DONNATAL  TABS (BELLADONNA ALK-PHENOBARBITAL) Take 1 by mouth 3 times daily-before meals, as needed.       Phone Note Call from Patient Call back at Work Phone [phone]   Caller: Patient Call For: Juanda Chance Reason for Call: Talk to Nurse Summary of Call: Patient wants to speak directly to nurse Initial call taken by: Tawni Levy,  October 13, 2009 9:00 AM  Follow-up for Phone Call        We are waiting on pt's appt. with Dr.Drossman for chronic pain, I faxed records on 09-11-09, having been calling weekly for appt, no appt. scheduled yet.  Pt. wants a refill on her Donnatal ordered 08-02-08. Med to pharmacy. Pt. instructed to call back as needed.  Follow-up by: Laureen Ochs LPN,  October 13, 2009 9:53 AM    New/Updated Medications: DONNATAL  TABS (BELLADONNA ALK-PHENOBARBITAL) Take 1 by mouth 3 times daily-before meals, as needed. Prescriptions: DONNATAL  TABS (BELLADONNA ALK-PHENOBARBITAL) Take 1 by mouth 3 times daily-before meals, as needed.  #30 x 2   Entered by:   Laureen Ochs LPN   Authorized by:   Hart Carwin MD   Signed by:   Laureen Ochs LPN on 09/81/1914   Method used:   Electronically to        [facility]  [phone]* (retail)       [address] [place], [place]  [address]       Ph: [phone] or [phone]       Fax: [phone]   RxID:   [id number]

## 2010-05-05 NOTE — Progress Notes (Signed)
Summary: Medication refill   Phone Note Call from Patient Call back at Home Phone [phone]   Caller: Patient Call For: Dr. Juanda Chance Reason for Call: Refill Medication Summary of Call: Need refill on Hydrocodone...[facility] Initial call taken by: Karna Christmas,  Aug 13, 2009 8:56 AM  Follow-up for Phone Call        Patient takes hydrocodone #20 per month for IBS pain. I have advised patient that we will not be giving her refills on hydrocodone at this time. We requested that she come for follow up office visit in December 2010 and at that time told her no more refills until she was seen in the office. She neglected to make office appointment. Patient is also due for a colonoscopy (was due in 02/2009). Patient called to tell us that she would have to call back to schedule because she didnt have transportation. I have again explained to patient that we will not be giving refills until she is actually seen in the office. She wanted to be seen today but I have explained that Dr Juanda Chance is not in the office today but that I will get her in on this upcoming Monday. Patient wanted another pain medication until she could be seen. I have advised her to take Tylenol OTC until her office visit. Follow-up by: Lamona Curl CMA Duncan Dull),  Aug 13, 2009 9:29 AM

## 2010-05-07 NOTE — Assessment & Plan Note (Signed)
Summary: ??bladder inf/burns when she urinates/sae/cd   Vital Signs:  Patient profile:   76 year old female Height:      64 inches Weight:      134 pounds BMI:     23.08 Temp:     98.2 degrees F oral Pulse rate:   80 / minute Pulse rhythm:   regular Resp:     16 per minute BP sitting:   150 / 72  (left arm) Cuff size:   regular  Vitals Entered By: Lanier Prude, Beverly Gust) (March 31, 2010 10:28 AM) CC: urinary frequency, dysuria X 5 days Is Patient Diabetic? No Comments pt is not taking Hydrocodone-Acetamin or Donnatal   Primary Care Braidyn Scorsone:  Romero Belling, MD  CC:  urinary frequency and dysuria X 5 days.  History of Present Illness: C/o urinary pain, urgency x 5 d. C/o chills.  Current Medications (verified): 1)  Synthroid 75 Mcg  Tabs (Levothyroxine Sodium) .... Take 1 Tablet By Mouth Once A Day 2)  Hydrocodone-Acetaminophen 5-500 Mg Tabs (Hydrocodone-Acetaminophen) .... Take 1 Tablet By Mouth As Needed Severe Abdominal Pain. 3)  Prilosec 20 Mg  Cpdr (Omeprazole) .... Take 1 Tablet By Mouth Once A Day 4)  Fish Oil   Oil (Fish Oil) .... Take 4 By Mouth Qd 5)  Calcium 500 500 Mg  Tabs (Calcium Carbonate) .... Take 2 By Mouth Qd 6)  Hydrocodone-Acetaminophen 5-500 Mg Tabs (Hydrocodone-Acetaminophen) .... Take 1-2 Tablets By Mouth As Needed For Pain. Must Get Further Refills From Dr Almyra Deforest 7)  Amlodipine Besylate 5 Mg Tabs (Amlodipine Besylate) .... By Mouth Once Daily 8)  Aspir-Low 81 Mg Tbec (Aspirin) .... Once Daily 9)  Vitamin D 1000 Unit Tabs (Cholecalciferol) .... Take 1 Tablet By Mouth Once A Day 10)  Donnatal  Tabs (Belladonna Alk-Phenobarbital) .... Take 1 By Mouth 3 Times Daily-Before Meals, As Needed. 11)  Stool Softener 100 Mg Caps (Docusate Sodium) .... One At Bedtime 12)  Nefazodone Hcl 150 Mg Tabs (Nefazodone Hcl) .... At Bedtime As Needed Pain 13)  Alprazolam 0.25 Mg Tabs (Alprazolam) .... At Bedtime  As Needed Sleep  Allergies (verified): No Known  Drug Allergies  Past History:  Past Medical History: Last updated: 07/27/2007 Current Problems:  GERD (ICD-530.81) DIVERTICULOSIS, COLON (ICD-562.10) DEPRESSION--dr cottle(ICD-311) HYPERGLYCEMIA (ICD-790.29) DYSPEPSIA (ICD-536.8) ABDOMINAL PAIN, CHRONIC (ICD-789.00) HRT (ICD-V07.4) IRRITABLE BOWEL SYNDROME (ICD-564.1) osteoporosis--rx refused  Social History: Last updated: 08/18/2009 divorced x many years unemployed Alcohol Use - no Illicit Drug Use - no Patient has never smoked.  Daily Caffeine Use 1 cup of 1/2 caf coffee  Physical Exam  General:  alert oriented no distress Mouth:  No deformity or lesions, dentition normal. Neck:  Supple; no masses or thyromegaly. Lungs:  Clear throughout to auscultation. Heart:  Regular rate and rhythm; no murmurs, rubs,  or bruits. Abdomen:  soft abdomen.Sensitive suprapubically. Normoactive bowel sounds. No bruit. No distention. Msk:  muscle bulk and strength are grossly normal.  no obvious joint swelling.   Extremities:  No clubbing, cyanosis, edema or deformities noted. Neurologic:  cn 2-12 grossly intact.   readily moves all 4's.   sensation is intact to touch on the feet  Skin:  Intact without significant lesions or rashes. Psych:  Alert and cooperative. Normal mood and affect.   Impression & Recommendations:  Problem # 1:  UTI (ICD-599.0) Assessment New  Orders: Specimen Handling (04540) T-Urine Culture (Spectrum Order) [id number]) TLB-Udip ONLY (81003-UDIP)  Her updated medication list for this problem includes:    Ciprofloxacin  Hcl 500 Mg Tabs (Ciprofloxacin hcl) .Marland Kitchen... 1 by mouth bid  Problem # 2:  CHILLS WITHOUT FEVER (ICD-780.64) Assessment: New  Complete Medication List: 1)  Synthroid 75 Mcg Tabs (Levothyroxine sodium) .... Take 1 tablet by mouth once a day 2)  Hydrocodone-acetaminophen 5-500 Mg Tabs (Hydrocodone-acetaminophen) .... Take 1 tablet by mouth as needed severe abdominal pain. 3)  Prilosec 20 Mg  Cpdr (Omeprazole) .... Take 1 tablet by mouth once a day 4)  Fish Oil Oil (Fish oil) .... Take 4 by mouth qd 5)  Calcium 500 500 Mg Tabs (Calcium carbonate) .... Take 2 by mouth qd 6)  Hydrocodone-acetaminophen 5-500 Mg Tabs (Hydrocodone-acetaminophen) .... Take 1-2 tablets by mouth as needed for pain. must get further refills from dr Almyra Deforest 7)  Amlodipine Besylate 5 Mg Tabs (Amlodipine besylate) .... By mouth once daily 8)  Aspir-low 81 Mg Tbec (Aspirin) .... Once daily 9)  Vitamin D 1000 Unit Tabs (Cholecalciferol) .... Take 1 tablet by mouth once a day 10)  Donnatal Tabs (Belladonna alk-phenobarbital) .... Take 1 by mouth 3 times daily-before meals, as needed. 11)  Stool Softener 100 Mg Caps (Docusate sodium) .... One at bedtime 12)  Nefazodone Hcl 150 Mg Tabs (Nefazodone hcl) .... At bedtime as needed pain 13)  Alprazolam 0.25 Mg Tabs (Alprazolam) .... At bedtime  as needed sleep 14)  Ciprofloxacin Hcl 500 Mg Tabs (Ciprofloxacin hcl) .Marland Kitchen.. 1 by mouth bid  Patient Instructions: 1)  Call if you are not better in a reasonable amount of time or if worse. Go to ER if feeling really bad!  2)  Please schedule a follow-up appointment in 2 weeks Dr Everardo All. 3)  toviaz 8 mg 1 a day as needed urgency, pain Prescriptions: CIPROFLOXACIN HCL 500 MG TABS (CIPROFLOXACIN HCL) 1 by mouth bid  #20 x 1   Entered and Authorized by:   Tresa Garter MD   Signed by:   Tresa Garter MD on 03/31/2010   Method used:   [facility] to        [facility]  [phone]* (retail)       [address] [place], [place]  [address]       Ph: [phone] or [phone]       Fax: [phone]   RxID:   [id number]    Orders Added: 1)  Specimen Handling [99000] 2)  T-Urine Culture (Spectrum Order) [[id number]] 3)  TLB-Udip ONLY [81003-UDIP] 4)  Est. Patient Level III [76160]

## 2010-06-23 LAB — LIPID PANEL
Cholesterol: 240 mg/dL — ABNORMAL HIGH (ref 0–200)
HDL: 104 mg/dL (ref >39)
Total CHOL/HDL Ratio: 2.3 RATIO
VLDL: 14 mg/dL (ref 0–40)

## 2010-06-23 LAB — CBC
MCHC: 33.1 g/dL (ref 30.0–36.0)
MCV: 83.6 fL (ref 78.0–100.0)
Platelets: 314 10*3/uL (ref 150–400)
WBC: 5 10*3/uL (ref 4.0–10.5)

## 2010-06-23 LAB — BASIC METABOLIC PANEL
Calcium: 8.9 mg/dL (ref 8.4–10.5)
Creatinine, Ser: 0.71 mg/dL (ref 0.4–1.2)
GFR calc Af Amer: >60 mL/min (ref >60)
GFR calc non Af Amer: >60 mL/min (ref >60)
Sodium: 135 mEq/L (ref 135–145)

## 2010-06-23 LAB — COMPREHENSIVE METABOLIC PANEL
ALT: 29 U/L (ref 0–35)
AST: 29 U/L (ref 0–37)
Albumin: 4.4 g/dL (ref 3.5–5.2)
Calcium: 9.5 mg/dL (ref 8.4–10.5)
Chloride: 95 mEq/L — ABNORMAL LOW (ref 96–112)
Creatinine, Ser: 0.74 mg/dL (ref 0.4–1.2)
GFR calc Af Amer: >60 mL/min (ref >60)
Sodium: 131 mEq/L — ABNORMAL LOW (ref 135–145)

## 2010-06-23 LAB — CARDIAC PANEL(CRET KIN+CKTOT+MB+TROPI)
CK, MB: 1.2 ng/mL (ref 0.3–4.0)
Relative Index: INVALID (ref 0.0–2.5)
Relative Index: INVALID (ref 0.0–2.5)
Total CK: 43 U/L (ref 7–177)
Total CK: 51 U/L (ref 7–177)
Troponin I: 0.01 ng/mL (ref 0.00–0.06)
Troponin I: 0.01 ng/mL (ref 0.00–0.06)

## 2010-06-23 LAB — DIFFERENTIAL
Eosinophils Relative: 3 % (ref 0–5)
Lymphocytes Relative: 25 % (ref 12–46)
Lymphs Abs: 1.3 10*3/uL (ref 0.7–4.0)
Monocytes Absolute: 0.6 10*3/uL (ref 0.1–1.0)

## 2010-06-23 LAB — URINE MICROSCOPIC-ADD ON

## 2010-06-23 LAB — URINALYSIS, ROUTINE W REFLEX MICROSCOPIC
Glucose, UA: NEGATIVE mg/dL
Hgb urine dipstick: NEGATIVE
Protein, ur: NEGATIVE mg/dL
Specific Gravity, Urine: 1.005 (ref 1.005–1.030)

## 2010-06-23 LAB — CK TOTAL AND CKMB (NOT AT ARMC)
CK, MB: 1 ng/mL (ref 0.3–4.0)
Relative Index: INVALID (ref 0.0–2.5)
Total CK: 53 U/L (ref 7–177)

## 2010-06-23 LAB — POCT CARDIAC MARKERS
CKMB, poc: <1 ng/mL — ABNORMAL LOW (ref 1.0–8.0)
Troponin i, poc: <0.05 ng/mL (ref 0.00–0.09)

## 2010-06-23 LAB — T4, FREE: Free T4: 1.36 ng/dL (ref 0.80–1.80)

## 2010-08-04 ENCOUNTER — Ambulatory Visit: Payer: Self-pay | Admitting: Endocrinology

## 2010-08-18 NOTE — Assessment & Plan Note (Signed)
[facility]                         GASTROENTEROLOGY OFFICE NOTE   MIYOSHI, LIGAS                       MRN:          [id number]  DATE:03/16/2007                            DOB:          May 19, 1934    Ms. Middlesworth is a 76 year old white female with irritable bowel syndrome.  She has had intermittent abdominal pain.  Last colonoscopy December 2000  showed diverticulosis of the left colon.  She is due for repeat  colonoscopy in December 2010.  She had a small-bowel capsule endoscopy  in November 2007 which was ordered during her hospitalization by Dr.  Leone Payor.  The exam was negative.  There was some rapid transit of fluid  through small bowel and there was some diverticulosis in the small  bowel.   MEDICATIONS:  1. Levothyroxine 75 mcg daily.  2. Hydrochlorothiazide 12.5 mg p.o. daily.  3. Prilosec OTC.  4. __________  0.75 mg p.o. daily.  5. Multivitamins.  6. Fish oil.  7. Calcium.  8. Activia.   PHYSICAL EXAMINATION:  Blood pressure 142/70, pulse 72 and weight 140  pounds.  The patient was alert and oriented.  LUNGS:  Clear to auscultation.  CORONARY:  With normal S1 and normal S2.  ABDOMEN:  Soft, nontender, with hyperactive bowel sounds and rushes.  No  distention, no tympany.  RECTAL:  With hard, hemoccult-negative stool.   IMPRESSION:  A 76 year old white female with small-bowel diverticulosis  and hyperactive bowel sounds which may indicate some bacterial  overgrowth.  She is treated symptomatically with probiotics which seem  to be helping.  She also takes small doses of Vicodin which help, and  Xanax at bedtime.  She has tried Levsin sublingually without much  improvement but may take some stronger antispasmodics such as  dicyclomine.   PLAN:  1. Samples of Align given to the patient.  She would pills from having      to eat yogurt.  2. Stool softener daily.  3. Bentyl 20 mg [facility]  4. Vicodin HP, dispense #50.  It has 660 mg of Tylenol, 10 mg of      hydrocodone.  5. Recall colonoscopy in December 2010.     Hedwig Morton. Juanda Chance, MD  Electronically Signed    DMB/MedQ  DD: 03/16/2007  DT: 03/16/2007  Job #: [id number]   cc:   Gregary Signs A. Everardo All, MD

## 2010-08-19 ENCOUNTER — Ambulatory Visit (INDEPENDENT_AMBULATORY_CARE_PROVIDER_SITE_OTHER): Payer: 59 | Admitting: Endocrinology

## 2010-08-19 ENCOUNTER — Encounter: Payer: Self-pay | Admitting: Endocrinology

## 2010-08-19 ENCOUNTER — Other Ambulatory Visit (INDEPENDENT_AMBULATORY_CARE_PROVIDER_SITE_OTHER): Payer: 59

## 2010-08-19 ENCOUNTER — Other Ambulatory Visit (INDEPENDENT_AMBULATORY_CARE_PROVIDER_SITE_OTHER): Payer: 59 | Admitting: Endocrinology

## 2010-08-19 VITALS — BP 128/82 | HR 77 | Temp 97.3°F | Ht 64.0 in | Wt 128.4 lb

## 2010-08-19 DIAGNOSIS — Z79899 Other long term (current) drug therapy: Secondary | ICD-10-CM | POA: Insufficient documentation

## 2010-08-19 DIAGNOSIS — K219 Gastro-esophageal reflux disease without esophagitis: Secondary | ICD-10-CM

## 2010-08-19 DIAGNOSIS — Z13220 Encounter for screening for lipoid disorders: Secondary | ICD-10-CM

## 2010-08-19 DIAGNOSIS — F329 Major depressive disorder, single episode, unspecified: Secondary | ICD-10-CM

## 2010-08-19 DIAGNOSIS — E7800 Pure hypercholesterolemia, unspecified: Secondary | ICD-10-CM

## 2010-08-19 DIAGNOSIS — R310 Gross hematuria: Secondary | ICD-10-CM | POA: Insufficient documentation

## 2010-08-19 DIAGNOSIS — I10 Essential (primary) hypertension: Secondary | ICD-10-CM

## 2010-08-19 DIAGNOSIS — R209 Unspecified disturbances of skin sensation: Secondary | ICD-10-CM

## 2010-08-19 DIAGNOSIS — Z0000 Encounter for general adult medical examination without abnormal findings: Secondary | ICD-10-CM

## 2010-08-19 DIAGNOSIS — R7309 Other abnormal glucose: Secondary | ICD-10-CM

## 2010-08-19 DIAGNOSIS — R319 Hematuria, unspecified: Secondary | ICD-10-CM

## 2010-08-19 DIAGNOSIS — R200 Anesthesia of skin: Secondary | ICD-10-CM

## 2010-08-19 DIAGNOSIS — E559 Vitamin D deficiency, unspecified: Secondary | ICD-10-CM

## 2010-08-19 DIAGNOSIS — K769 Liver disease, unspecified: Secondary | ICD-10-CM | POA: Insufficient documentation

## 2010-08-19 LAB — URINALYSIS, ROUTINE W REFLEX MICROSCOPIC
Bilirubin Urine: NEGATIVE
Leukocytes, UA: NEGATIVE
Specific Gravity, Urine: <=1.005 (ref 1.000–1.030)
Urobilinogen, UA: 0.2 (ref 0.0–1.0)

## 2010-08-19 LAB — CBC WITH DIFFERENTIAL/PLATELET
Basophils Relative: 0.9 % (ref 0.0–3.0)
HCT: 40.1 % (ref 36.0–46.0)
Hemoglobin: 13.8 g/dL (ref 12.0–15.0)
Lymphocytes Relative: 19.3 % (ref 12.0–46.0)
Lymphs Abs: 1.2 10*3/uL (ref 0.7–4.0)
MCHC: 34.3 g/dL (ref 30.0–36.0)
Monocytes Relative: 9.3 % (ref 3.0–12.0)
Neutro Abs: 4.4 10*3/uL (ref 1.4–7.7)
RBC: 4.88 Mil/uL (ref 3.87–5.11)

## 2010-08-19 LAB — BASIC METABOLIC PANEL
BUN: 8 mg/dL (ref 6–23)
Creatinine, Ser: 0.7 mg/dL (ref 0.4–1.2)
GFR: 94.67 mL/min (ref >60.00)
Glucose, Bld: 75 mg/dL (ref 70–99)
Potassium: 5.1 mEq/L (ref 3.5–5.1)

## 2010-08-19 LAB — HEPATIC FUNCTION PANEL
AST: 36 U/L (ref 0–37)
Albumin: 4.1 g/dL (ref 3.5–5.2)
Alkaline Phosphatase: 58 U/L (ref 39–117)
Total Protein: 6.6 g/dL (ref 6.0–8.3)

## 2010-08-19 LAB — LIPID PANEL
Cholesterol: 239 mg/dL — ABNORMAL HIGH (ref 0–200)
Total CHOL/HDL Ratio: 2
VLDL: 8.6 mg/dL (ref 0.0–40.0)

## 2010-08-19 LAB — VITAMIN B12: Vitamin B-12: 717 pg/mL (ref 211–911)

## 2010-08-19 LAB — TSH: TSH: 1.65 u[IU]/mL (ref 0.35–5.50)

## 2010-08-19 MED ORDER — AMLODIPINE BESYLATE 5 MG PO TABS: 5.0000 mg | ORAL_TABLET | Freq: Every day | ORAL | Status: DC

## 2010-08-19 MED ORDER — LEVOTHYROXINE SODIUM 75 MCG PO TABS: 75.0000 ug | ORAL_TABLET | Freq: Every day | ORAL | Status: DC

## 2010-08-19 MED ORDER — OMEPRAZOLE 20 MG PO CPDR: 20.0000 mg | DELAYED_RELEASE_CAPSULE | Freq: Every day | ORAL | Status: DC

## 2010-08-19 NOTE — Patient Instructions (Addendum)
please consider these measures for your health:  minimize alcohol.  do not use tobacco products.  have a colonoscopy at least every 10 years from age 76.  keep firearms safely stored.  always use seat belts.  have working smoke alarms in your home.  see an eye doctor and dentist regularly.  never drive under the influence of alcohol or drugs (including prescription drugs).  those with fair skin should take precautions against the sun. please let me know what your wishes would be, if artificial life support measures should become necessary.  it is critically important to prevent falling down (keep floor areas well-lit, dry, and free of loose objects) Please return in 1 year. Please let me know if you decide to get pneumonia and shingles vaccine. blood tests are being ordered for you today.  please call [phone] to hear your test results.  You will be prompted to enter the 9-digit "MRN" number that appears at the top left of this page, followed by #.  Then you will hear the message. (update: i left message on phone-tree:  Go to lab in 2 weeks for liver studies).

## 2010-08-19 NOTE — Progress Notes (Signed)
Subjective:    Patient ID: Kara Davis, female    DOB: [birth year], 76 y.o.   MRN: [id number]  HPI here for regular wellness examination.  He's feeling pretty well in general (except for slight numbness of the 3-5 fingers of the left hand, and says chronic med probs are stable, except as noted below.  She recently saw gyn.  Past Medical History  Diagnosis Date  . VITAMIN D DEFICIENCY 05/23/2008  . HYPERCHOLESTEROLEMIA 07/27/2007  . HYPERTENSION 07/27/2007  . GERD 12/06/2006  . DIVERTICULOSIS, COLON 12/06/2006  . Irritable bowel syndrome 12/06/2006  . DYSPEPSIA 12/06/2006  . Osteoporosis     rx refused  . DEPRESSION 12/06/2006    Dr. Jennelle Human    Past Surgical History  Procedure Date  . Appendectomy   . Exploratory laparotomy 1993    secondary to abd. pain    History   Social History  . Marital Status: Widowed    Spouse Name: N/A    Number of Children: N/A  . Years of Education: N/A   Occupational History  . Unemployed    Social History Main Topics  . Smoking status: Never Smoker   . Smokeless tobacco: Not on file  . Alcohol Use: No  . Drug Use: No  . Sexually Active:    Other Topics Concern  . Not on file   Social History Narrative   Divorced x many yearsDaily Caffeine Use-1 cup of 1/2 caf coffee  retired Current Outpatient Prescriptions on File Prior to Visit  Medication Sig Dispense Refill  . amLODipine (NORVASC) 5 MG tablet Take 5 mg by mouth daily.        . cholecalciferol (VITAMIN D) 1000 UNITS tablet Take 1,000 Units by mouth daily.        Marland Kitchen levothyroxine (SYNTHROID, LEVOTHROID) 75 MCG tablet Take 75 mcg by mouth daily.        . nefazodone (SERZONE) 150 MG tablet Take 1 tablet at bedtime as needed for pain (75mg )      . Omega-3 Fatty Acids (FISH OIL) 1000 MG CAPS Take 1 capsule by mouth daily.       Marland Kitchen omeprazole (PRILOSEC) 20 MG capsule Take 20 mg by mouth daily.        Marland Kitchen DISCONTD: ALPRAZolam (XANAX) 0.25 MG tablet Take 0.25 mg by mouth at bedtime as needed.          Marland Kitchen DISCONTD: aspirin 81 MG tablet Take 81 mg by mouth daily.        Marland Kitchen DISCONTD: atropine-PHENObarbital-scopolamine-hyoscyamine (DONNATAL) 16.2 MG tablet Take 1 tablet by mouth 3 times daily-before meals, as needed       . DISCONTD: Calcium Carbonate (CALCIUM 500 PO) Take 2 tablets by mouth daily.        Marland Kitchen DISCONTD: docusate sodium (STOOL SOFTENER) 100 MG capsule Take 100 mg by mouth at bedtime.        Marland Kitchen DISCONTD: HYDROcodone-acetaminophen (VICODIN) 5-500 MG per tablet Take 1-2 tablets by mouth as needed for pain         No Known Allergies  Family History  Problem Relation Age of Onset  . Heart disease Mother   . Diabetes Brother   . Cancer Neg Hx     BP 128/82  Pulse 77  Temp(Src) 97.3 F (36.3 C) (Oral)  Ht 5\' 4"  (1.626 m)  Wt 128 lb 6.4 oz (58.242 kg)  BMI 22.04 kg/m2  SpO2 98%    Review of Systems  Constitutional: Negative for fever and unexpected weight  change.  HENT: Negative for hearing loss.   Eyes: Negative for visual disturbance.  Respiratory: Negative for shortness of breath.   Cardiovascular: Negative for chest pain.  Gastrointestinal: Negative for anal bleeding.  Genitourinary: Negative for hematuria.  Musculoskeletal: Negative for arthralgias.  Skin: Negative for rash.  Neurological: Negative for syncope.  Hematological: Does not bruise/bleed easily.  Psychiatric/Behavioral: Negative for dysphoric mood. The patient is nervous/anxious.        Objective:   Physical Exam VS: see vs page GEN: no distress HEAD: head: no deformity eyes: no periorbital swelling, no proptosis external nose and ears are normal mouth: no lesion seen NECK: supple, thyroid is not enlarged CHEST WALL: no deformity BREASTS:  sees gyn CV: reg rate and rhythm, no murmur ABD: abdomen is soft, nontender.  no hepatosplenomegaly.  not distended.  no hernia GENITALIA:  sees gyn RECTAL: sees gyn MUSCULOSKELETAL: muscle bulk and strength are grossly normal.  no obvious joint swelling.   gait is normal and steady EXTEMITIES: no deformity.  no ulcer on the feet.  feet are of normal color and temp.  no edema PULSES: dorsalis pedis intact bilat.  no carotid bruit NEURO:  cn 2-12 grossly intact.   readily moves all 4's.  sensation is intact to touch on the feet SKIN:  Normal texture and temperature.  No rash or suspicious lesion is visible.   NODES:  None palpable at the neck PSYCH: alert, oriented x3.  Does not appear anxious nor depressed.      Assessment & Plan:  Wellness visit today, with problems stable, except as noted. elev lft, new.  uncertain etiology

## 2010-08-20 LAB — VITAMIN D 25 HYDROXY (VIT D DEFICIENCY, FRACTURES): Vit D, 25-Hydroxy: 32 ng/mL (ref 30–89)

## 2010-08-21 ENCOUNTER — Telehealth: Payer: Self-pay | Admitting: *Deleted

## 2010-08-21 NOTE — Op Note (Signed)
Kara Davis, Kara Davis                ACCOUNT NO.:  [id number]   MEDICAL RECORD NO.:  [id number]          PATIENT TYPE:  AMB   LOCATION:  ENDO                         FACILITY:  [facility]   PHYSICIAN:  Iva Boop, MD,FACGDATE OF BIRTH:  [birth year]   DATE OF PROCEDURE:  02/04/2006  DATE OF DISCHARGE:  02/04/2006                                 OPERATIVE REPORT   PROCEDURE:  Small-bowel capsule endoscopy.   INDICATIONS:  Abdominal pain.   DATA:  Height 64 inches, weight 140 pounds, normal build.  Gastric passage  time 6 minutes, small-bowel passage time 1 hour 35 minutes.   FINDINGS:  Completed study with some rapid transit of the small-bowel.  There is small-bowel diverticulosis but no other findings to explain her  chronic abdominal pain.   SUMMARY/RECOMMENDATIONS:  Plans for Dr. Juanda Chance.      Iva Boop, MD,FACG  Electronically Signed     CEG/MEDQ  D:  02/09/2006  T:  02/10/2006  Job:  [id number]

## 2010-08-21 NOTE — H&P (Signed)
NAME:  Kara Davis, Kara Davis                          ACCOUNT NO.:  [id number]   MEDICAL RECORD NO.:  [id number]                   PATIENT TYPE:  INP   LOCATION:  0381                                 [facility]ACILITY[facility]  [facility]   PHYSICIAN:  [name], M.D. [facility]               DATE OF BIRTH:  May 21, 1934   DATE OF ADMISSION:  10/19/2002  DATE OF DISCHARGE:                                HISTORY & PHYSICAL   CHIEF COMPLAINT:  Acute severe abdominal pain.   HISTORY OF PRESENT ILLNESS:  Kara Davis is a pleasant 76 year old female known to  Dr. Marina Davis who has a history of chronic functional abdominal pain felt  secondary to IBS.  Also, with a history of universal diverticulosis, and  also a known large duodenal diverticulum.  She is status post appendectomy,  had a remote exploratory laparotomy done for her chronic abdominal pain  which was essentially negative.  She also has a history of hypothyroidism  and depression.  Her last colonoscopy was done in 2000, which showed  scattered diffuse diverticulosis.  She had not been helped in the past by  anti-spasmodics, etc., and says that she has some level of discomfort daily.  She says I just live with it.  She has now had more acute onset of pain  last evening which has been progressive, and was severe at about 4 a.m.  She  says the pain has been constant since then, rated it as an 11/10 earlier.  The pain is located in the hypogastrium, left upper quadrant, and into her  left back.  She describes it as a squeezing twisting pain associated with  nausea, no vomiting, no fever or chills.  She had a normal bowel movement  this morning without melena or hematochezia.  She went to work today, called  in with symptoms, and is now seen in the office and admitted with persistent  acute pain, etiology unclear, for further diagnostic workup and analgesics.   CURRENT MEDICATIONS:  1. Serzone 150 mg b.i.d.  2. Synthroid 0.75 mg daily.  3. Xanax 0.25 mg q.h.s.  4. Protonix  40 mg daily.   ALLERGIES:  No known drug allergies.   PAST MEDICAL HISTORY:  As outlined above.   FAMILY HISTORY:  Negative for GI disease.   SOCIAL HISTORY:  The patient lives alone.  She does have family in town.  She is employed with [employer].  No tobacco, no regular ETOH.   REVIEW OF SYSTEMS:  CARDIOVASCULAR:  Denies any chest pain or anginal  symptoms.  PULMONARY:  Negative for cough, shortness of breath, or sputum  production.  GENITOURINARY:  Negative for dysuria, urgency, or frequency.  She has not had any history of ureterolithiasis.  GASTROINTESTINAL:  As  above.   PHYSICAL EXAMINATION:  GENERAL:  A well-developed white female in no acute  distress.  She is uncomfortable.  VITAL SIGNS:  Temperature is 97.6,  blood pressure 130/60, pulse of 74,  weight is 132.  HEENT:  Normocephalic, atraumatic.  EOMI.  PERRLA.  Sclerae is anicteric.  NECK:  Supple without nodes.  No JVD or bruit.  CARDIOVASCULAR:  Regular rate and rhythm with S1 and S2.  PULMONARY:  Clear to A&P.  ABDOMEN:  Soft, bowel sounds are present, but decreased.  She is tender in  the hypogastrium and left upper quadrant and also has some left CVA  tenderness.  There is no abdominal distention, no rebound, no guarding, and  no mass or hepatosplenomegaly.  RECTAL:  Heme negative and without mass.  EXTREMITIES:  No cyanosis, clubbing, or edema.  NEUROLOGIC:  Nonfocal.   LABORATORY DATA:  Pending on admission.   IMPRESSION:  78. A 76 year old white female with acute severe abdominal pain, upper     abdomen and left back, etiology not clear.  Rule out pancreatitis, small     bowel obstruction, ischemia, diverticulitis with known duodenal     diverticulum as well.  2. Status post appendectomy.  3. Hypothyroidism.  4. Depression.  5. Chronic functional abdominal pain/irritable bowel syndrome with prior     negative exploratory laparotomy.   PLAN:  The patient is admitted to the service of Dr. [name]  for IV  fluid hydration based on laboratory studies.  CT scan of the abdomen and  pelvis should be covered with IV Unasyn.  We will keep her at bowel rest,  place her on p.o. Protonix, and for further details please see the orders.       Mike Gip, P.A.-C. [facility]                [name], M.D. [facility]    AE/MEDQ  D:  10/19/2002  T:  10/19/2002  Job:  [id number]

## 2010-08-21 NOTE — Telephone Encounter (Signed)
Pt [facility] Amlodipine, Omeprazole, and Levothyroxine which were incorrectly given as #30x11--If referred back [to [facility]], all previous prescriptions were dispense #90--this has been corrected and Pt has been informed.

## 2010-09-03 ENCOUNTER — Other Ambulatory Visit (INDEPENDENT_AMBULATORY_CARE_PROVIDER_SITE_OTHER): Payer: 59

## 2010-09-03 DIAGNOSIS — K769 Liver disease, unspecified: Secondary | ICD-10-CM

## 2010-09-03 LAB — HEPATIC FUNCTION PANEL
AST: 27 U/L (ref 0–37)
Albumin: 3.9 g/dL (ref 3.5–5.2)
Alkaline Phosphatase: 61 U/L (ref 39–117)
Total Protein: 6.6 g/dL (ref 6.0–8.3)

## 2010-09-03 LAB — HEPATITIS C ANTIBODY: HCV Ab: NEGATIVE

## 2010-09-09 ENCOUNTER — Telehealth: Payer: Self-pay

## 2010-09-09 NOTE — Telephone Encounter (Signed)
Pt called stating that although her Liver studies came back negative her ALT is still very elevated. [name] concerned about this and is requesting MD advise on possible causes or follow up treatment/studies.

## 2010-09-09 NOTE — Telephone Encounter (Signed)
No, alt has returned to normal--good

## 2010-09-09 NOTE — Telephone Encounter (Signed)
Pt advised of same.  

## 2010-10-12 ENCOUNTER — Telehealth: Payer: Self-pay | Admitting: Internal Medicine

## 2010-10-12 ENCOUNTER — Encounter: Payer: Self-pay | Admitting: *Deleted

## 2010-10-12 ENCOUNTER — Ambulatory Visit (INDEPENDENT_AMBULATORY_CARE_PROVIDER_SITE_OTHER): Payer: 59 | Admitting: Nurse Practitioner

## 2010-10-12 ENCOUNTER — Encounter: Payer: Self-pay | Admitting: Nurse Practitioner

## 2010-10-12 ENCOUNTER — Telehealth: Payer: Self-pay | Admitting: Nurse Practitioner

## 2010-10-12 DIAGNOSIS — R109 Unspecified abdominal pain: Secondary | ICD-10-CM

## 2010-10-12 MED ORDER — HYDROCODONE-ACETAMINOPHEN 5-500 MG PO TABS: ORAL_TABLET | ORAL | Status: DC

## 2010-10-12 NOTE — Telephone Encounter (Signed)
Patient calling to report that she woke up at 4:00 AM with severe lower abdominal pain and nausea. States the pain is below her belly button and is different that she has had before. States she can hardly walk because of the pain and nausea and she almost went to the hospital. Patient states she has had a regular bowel movement this AM. She states she has constipation with IBS but has not had that issue today. She did eat Cherrios for breakfast and they have "stayed down." Denies fever or chills. Offered for patient to see Willette Cluster, NP this afternoon but patient states she cannot wait until then. Scheduled patient at 10:30 AM to see Willette Cluster, NP.

## 2010-10-12 NOTE — Progress Notes (Signed)
Kara Davis [id number] [birth year]   HISTORY OR PRESENT ILLNESS : Kara Davis is a 76 year old female known to Dr. Juanda Chance for chronic abdominal pain attributed to irritable bowel syndrome. She also has a history of gastritis and diverticulosis. Her last colonoscopy with polypectomy was in May 2011 and remarkable for a  tubular adenoma and diverticulosis.  Today patient awoke from sleep with severe lower abdominal pain and nausea. Pain different than her usual pain in that it is lower abdominal rather than periumbilical. She tried a heating pad, didn't help. The pain is still present now. Her bowel movements are okay. She had an episode of rectal bleeding on one occasion last week, none since. Normal bowel movement today. Patient feels her pain is secondary to IBS. No fevers. No urinary symptoms. Feels like if she could just get some help today that she would "be all right tomorrow"  Current Medications, Allergies, Past Medical History, Past Surgical History, Family History and Social History were reviewed in [employer] record.   PHYSICAL EXAMINATION : General: Well developed female in no acute distress Head: Normocephalic and atraumatic Eyes:  sclerae anicteric,conjunctive pink. Ears: Normal auditory acuity Mouth: No deformity or lesions Neck: Supple, no masses.  Lungs: Clear throughout to auscultation Heart: Regular rate and rhythm; no murmurs heard Abdomen: Soft, nondistended, mild to moderate tenderness of the left abdomen just lateral to the umbilicus. No masses or hepatomegaly noted. Normal bowel sounds Rectal: Not done Musculoskeletal: Symmetrical with no gross deformities  Skin: No lesions on visible extremities Extremities: No edema or deformities noted Neurological: Alert oriented x 4, grossly nonfocal Cervical Nodes:  No significant cervical adenopathy Psychological:  Alert and cooperative. Normal mood and affect

## 2010-10-12 NOTE — Telephone Encounter (Signed)
I spoke to the [facility]ist at [facility] [address], and he just got my fax for the Hydrocodone-acetaminophin 5/ 325 mg.   He said it would be 45 min until it is ready.  I called the patient to advise it will be ready about 3:45 PM.  I apologized for her inconvenience.  She said that was okay.

## 2010-10-13 ENCOUNTER — Encounter: Payer: Self-pay | Admitting: Nurse Practitioner

## 2010-10-15 NOTE — Assessment & Plan Note (Signed)
Chronic periumbilical pain with negative workup in the past. Patient has constipation predominant IBS. She has was seen last by Dr. Juanda Chance in May 2011 when she had a colonoscopy for further evaluation of pain. Patient takes Hydrocodone as needed for abdominal pain, we last prescribed it July 2011. Kara Davis looks okay today. Her abdominal exam is not overly concerning. She has a history of diverticulosis but suspicion for diverticulitis is low based on her exam today. She has a routine follow up with Dr. Juanda Chance later this month. In the meantime I will refill Hydrocodone to take as needed for abdominal pain. If no improvement patient will call back tomorrow for further recommendations.

## 2010-10-19 NOTE — Progress Notes (Signed)
I AGREE WITH ASSESSMENT AND PLANS 

## 2010-10-28 ENCOUNTER — Ambulatory Visit (INDEPENDENT_AMBULATORY_CARE_PROVIDER_SITE_OTHER): Payer: 59 | Admitting: Internal Medicine

## 2010-10-28 ENCOUNTER — Encounter: Payer: Self-pay | Admitting: Internal Medicine

## 2010-10-28 VITALS — BP 140/68 | HR 72 | Ht 64.0 in | Wt 128.8 lb

## 2010-10-28 DIAGNOSIS — K589 Irritable bowel syndrome without diarrhea: Secondary | ICD-10-CM

## 2010-10-28 DIAGNOSIS — R1033 Periumbilical pain: Secondary | ICD-10-CM

## 2010-10-28 MED ORDER — HYOSCYAMINE SULFATE 0.125 MG SL SUBL: SUBLINGUAL_TABLET | SUBLINGUAL | Status: DC

## 2010-10-28 NOTE — Patient Instructions (Addendum)
We have sent the following medications to your pharmacy for you to pick up at your convenience: Levsin SL. Dissolve 2 tablets under the tongue at bedtime. Your physician has requested that you go to the basement for the following lab work before leaving today: Porphyrin We have left a message for [facility] to contact our office back about an appointment for you. CC:Dr Romero Belling

## 2010-10-28 NOTE — Progress Notes (Signed)
Kara Davis May 18, 1934 MRN [id number]    History of Present Illness:  This is a 76 year old white female with irritable bowel syndrome and chronic abdominal pain which wakes her up at 3 in the morning every day. She has had an extensive GI workup which has included endoscopic studies, capsule endoscopy, CT scan, CT angiogram as well as consultation with Dr. Almyra Deforest At [facility]. Dr. Almyra Deforest concurred with the diagnosis of IBS and started patient on an antidepressant. She was unable to tolerate it. She was also recommended to try  biofeedback. She would like to find a biofeedback center in [place] or close to her home. She has tried Levsin sublingual 0.125 mg and other antispasmodics with some relief. She also uses a heating pad. Her last colonoscopy was in June 2011. She has a history an exploratory laparotomy in 1993 with findings of chronic vascular ischemia of the distal two thirds of the jejunum and proximal ileum. She had a hyperplastic polyp in 1995. She has moderate diverticulosis of the left colon. Her bowel habits are regular.   Past Medical History  Diagnosis Date  . VITAMIN D DEFICIENCY 05/23/2008  . HYPERCHOLESTEROLEMIA 07/27/2007  . HYPERTENSION 07/27/2007  . GERD 12/06/2006  . DIVERTICULOSIS, COLON 12/06/2006  . Irritable bowel syndrome 12/06/2006  . Osteoporosis     rx refused  . DEPRESSION 12/06/2006    Dr. Jennelle Human   Past Surgical History  Procedure Date  . Appendectomy   . Exploratory laparotomy 1993    secondary to abd. pain  . Thumb surgery     for fracture    reports that she has never smoked. She has never used smokeless tobacco. She reports that she drinks alcohol. She reports that she does not use illicit drugs. family history includes Diabetes in her brother; Heart disease in her mother; and Prostate cancer in her brother.  There is no history of Colon cancer. No Known Allergies      Review of Systems: Denies dysphagia, odynophagia. Chest pain or  shortness of breath  The remainder of the 10  point ROS is negative except as outlined in H&P   Physical Exam: General appearance  Well developed, in no distress. Eyes- non icteric. HEENT nontraumatic, normocephalic. Mouth no lesions, tongue papillated, no cheilosis. Neck supple without adenopathy, thyroid not enlarged, no carotid bruits, no JVD. Lungs Clear to auscultation bilaterally. Cor normal S1 normal S2, regular rhythm , no murmur,  quiet precordium. Abdomen severe burn marks from a heating pad across her abdomen, normoactive bowel sounds. Mild diffuse tenderness. No bruit. Rectal: soft Hemoccult negative stool. Extremities no pedal edema. Skin no lesions. Neurological alert and oriented x 3. Psychological normal mood and affect.  Assessment and Plan:  Problem #1 Irritable bowel syndrome. Patient has a history of an exploratory laparotomy for ischemic small bowel. Her CT angiogram did not show any vascular lesions. I will check for porphyrins. Start Levsin 0.125 mg one or 2 when necessary. We will make a referral for biofeedback.   10/28/2010 Kara Davis

## 2010-10-30 ENCOUNTER — Other Ambulatory Visit: Payer: Self-pay | Admitting: Internal Medicine

## 2010-10-30 ENCOUNTER — Other Ambulatory Visit: Payer: 59

## 2010-10-30 DIAGNOSIS — K589 Irritable bowel syndrome without diarrhea: Secondary | ICD-10-CM

## 2010-11-04 ENCOUNTER — Telehealth: Payer: Self-pay | Admitting: Internal Medicine

## 2010-11-04 NOTE — Telephone Encounter (Signed)
Noted  

## 2010-12-17 ENCOUNTER — Other Ambulatory Visit: Payer: Self-pay | Admitting: Internal Medicine

## 2010-12-17 NOTE — Telephone Encounter (Signed)
Dr Juanda Chance- Patient wants refills on hydrocodone for IBS. We gave a limited supply (#20) on 10/12/10 (I think we sent her to Dr Almyra Deforest in the past for evaluation of chronic abdominal pain and they apparently did some sort of detox from the pain meds???.... See 10/18/09 note). Do you want me to give her more or was this a 1 time rx to get her through the IBS flare??

## 2010-12-20 NOTE — Telephone Encounter (Signed)
OK to give #15/ month.

## 2010-12-21 MED ORDER — HYDROCODONE-ACETAMINOPHEN 5-500 MG PO TABS: ORAL_TABLET | ORAL | Status: DC

## 2010-12-21 NOTE — Telephone Encounter (Signed)
rx faxed to pharmacy. Patient advised.

## 2011-02-03 ENCOUNTER — Other Ambulatory Visit: Payer: Self-pay | Admitting: *Deleted

## 2011-02-03 MED ORDER — HYDROCODONE-ACETAMINOPHEN 5-500 MG PO TABS: ORAL_TABLET | ORAL | Status: DC

## 2011-02-03 NOTE — Telephone Encounter (Signed)
rx sent

## 2011-03-13 ENCOUNTER — Encounter (HOSPITAL_COMMUNITY): Payer: Self-pay | Admitting: *Deleted

## 2011-03-13 ENCOUNTER — Inpatient Hospital Stay (HOSPITAL_COMMUNITY)
Admission: EM | Admit: 2011-03-13 | Discharge: 2011-03-15 | DRG: 690 | Disposition: A | Discharge status: Home / Self Care | Payer: 59 | Source: Ambulatory Visit | Attending: Internal Medicine | Admitting: Internal Medicine

## 2011-03-13 ENCOUNTER — Emergency Department (HOSPITAL_COMMUNITY): Payer: 59

## 2011-03-13 DIAGNOSIS — I728 Aneurysm of other specified arteries: Secondary | ICD-10-CM | POA: Diagnosis present

## 2011-03-13 DIAGNOSIS — E559 Vitamin D deficiency, unspecified: Secondary | ICD-10-CM | POA: Diagnosis present

## 2011-03-13 DIAGNOSIS — D649 Anemia, unspecified: Secondary | ICD-10-CM | POA: Diagnosis not present

## 2011-03-13 DIAGNOSIS — K5730 Diverticulosis of large intestine without perforation or abscess without bleeding: Secondary | ICD-10-CM | POA: Diagnosis present

## 2011-03-13 DIAGNOSIS — R109 Unspecified abdominal pain: Secondary | ICD-10-CM | POA: Diagnosis present

## 2011-03-13 DIAGNOSIS — E7800 Pure hypercholesterolemia, unspecified: Secondary | ICD-10-CM | POA: Diagnosis present

## 2011-03-13 DIAGNOSIS — F411 Generalized anxiety disorder: Secondary | ICD-10-CM | POA: Diagnosis present

## 2011-03-13 DIAGNOSIS — M810 Age-related osteoporosis without current pathological fracture: Secondary | ICD-10-CM | POA: Diagnosis present

## 2011-03-13 DIAGNOSIS — E039 Hypothyroidism, unspecified: Secondary | ICD-10-CM | POA: Diagnosis present

## 2011-03-13 DIAGNOSIS — G459 Transient cerebral ischemic attack, unspecified: Secondary | ICD-10-CM

## 2011-03-13 DIAGNOSIS — I10 Essential (primary) hypertension: Secondary | ICD-10-CM | POA: Diagnosis present

## 2011-03-13 DIAGNOSIS — H538 Other visual disturbances: Secondary | ICD-10-CM | POA: Diagnosis present

## 2011-03-13 DIAGNOSIS — F419 Anxiety disorder, unspecified: Secondary | ICD-10-CM

## 2011-03-13 DIAGNOSIS — I671 Cerebral aneurysm, nonruptured: Secondary | ICD-10-CM

## 2011-03-13 DIAGNOSIS — F3289 Other specified depressive episodes: Secondary | ICD-10-CM | POA: Diagnosis present

## 2011-03-13 DIAGNOSIS — K589 Irritable bowel syndrome without diarrhea: Secondary | ICD-10-CM | POA: Diagnosis present

## 2011-03-13 DIAGNOSIS — N390 Urinary tract infection, site not specified: Principal | ICD-10-CM | POA: Diagnosis present

## 2011-03-13 DIAGNOSIS — K219 Gastro-esophageal reflux disease without esophagitis: Secondary | ICD-10-CM | POA: Diagnosis present

## 2011-03-13 DIAGNOSIS — R002 Palpitations: Secondary | ICD-10-CM | POA: Diagnosis present

## 2011-03-13 DIAGNOSIS — F329 Major depressive disorder, single episode, unspecified: Secondary | ICD-10-CM | POA: Diagnosis present

## 2011-03-13 DIAGNOSIS — Z7982 Long term (current) use of aspirin: Secondary | ICD-10-CM

## 2011-03-13 DIAGNOSIS — D709 Neutropenia, unspecified: Secondary | ICD-10-CM | POA: Diagnosis not present

## 2011-03-13 DIAGNOSIS — M7989 Other specified soft tissue disorders: Secondary | ICD-10-CM | POA: Diagnosis present

## 2011-03-13 DIAGNOSIS — R51 Headache: Secondary | ICD-10-CM | POA: Diagnosis present

## 2011-03-13 DIAGNOSIS — E871 Hypo-osmolality and hyponatremia: Secondary | ICD-10-CM | POA: Diagnosis present

## 2011-03-13 HISTORY — DX: Palpitations: R00.2

## 2011-03-13 LAB — URINALYSIS, ROUTINE W REFLEX MICROSCOPIC
Ketones, ur: NEGATIVE mg/dL
Nitrite: NEGATIVE
Protein, ur: NEGATIVE mg/dL
Urobilinogen, UA: 0.2 mg/dL (ref 0.0–1.0)
pH: 7.5 (ref 5.0–8.0)

## 2011-03-13 LAB — COMPREHENSIVE METABOLIC PANEL
Alkaline Phosphatase: 70 U/L (ref 39–117)
BUN: 8 mg/dL (ref 6–23)
GFR calc Af Amer: >90 mL/min (ref >90)
GFR calc non Af Amer: 89 mL/min — ABNORMAL LOW (ref >90)
Glucose, Bld: 95 mg/dL (ref 70–99)
Potassium: 4.2 mEq/L (ref 3.5–5.1)
Total Protein: 7.2 g/dL (ref 6.0–8.3)

## 2011-03-13 LAB — CBC
HCT: 40.6 % (ref 36.0–46.0)
Hemoglobin: 13.7 g/dL (ref 12.0–15.0)
MCH: 27.1 pg (ref 26.0–34.0)
MCHC: 33.7 g/dL (ref 30.0–36.0)

## 2011-03-13 LAB — PROTIME-INR
INR: 1.02 (ref 0.00–1.49)
Prothrombin Time: 13.6 seconds (ref 11.6–15.2)

## 2011-03-13 MED ORDER — AMLODIPINE BESYLATE 5 MG PO TABS
5.0000 mg | ORAL_TABLET | Freq: Every day | ORAL | Status: DC
  Administered 2011-03-15: 5 mg via ORAL
  Filled 2011-03-13 (×3): qty 1

## 2011-03-13 MED ORDER — ONDANSETRON HCL 4 MG PO TABS
4.0000 mg | ORAL_TABLET | Freq: Four times a day (QID) | ORAL | Status: DC | PRN
  Administered 2011-03-14: 4 mg via ORAL
  Filled 2011-03-13: qty 1

## 2011-03-13 MED ORDER — ALPRAZOLAM 0.25 MG PO TABS
0.2500 mg | ORAL_TABLET | Freq: Three times a day (TID) | ORAL | Status: DC | PRN
  Administered 2011-03-13: 0.25 mg via ORAL
  Filled 2011-03-13: qty 1

## 2011-03-13 MED ORDER — ASPIRIN 325 MG PO TABS
ORAL_TABLET | ORAL | Status: AC
  Filled 2011-03-13: qty 1

## 2011-03-13 MED ORDER — HYDROCODONE-ACETAMINOPHEN 5-325 MG PO TABS
1.0000 | ORAL_TABLET | ORAL | Status: DC | PRN
  Administered 2011-03-14 – 2011-03-15 (×4): 1 via ORAL
  Filled 2011-03-13: qty 2
  Filled 2011-03-13: qty 1
  Filled 2011-03-13: qty 2
  Filled 2011-03-13: qty 1

## 2011-03-13 MED ORDER — ONDANSETRON HCL 4 MG/2ML IJ SOLN
4.0000 mg | Freq: Once | INTRAMUSCULAR | Status: AC
  Administered 2011-03-13: 4 mg via INTRAVENOUS
  Filled 2011-03-13: qty 2

## 2011-03-13 MED ORDER — LORAZEPAM 2 MG/ML IJ SOLN
1.0000 mg | Freq: Once | INTRAMUSCULAR | Status: AC
  Administered 2011-03-13: 1 mg via INTRAVENOUS
  Filled 2011-03-13: qty 1

## 2011-03-13 MED ORDER — MORPHINE SULFATE 2 MG/ML IJ SOLN
INTRAMUSCULAR | Status: AC
  Filled 2011-03-13: qty 2

## 2011-03-13 MED ORDER — LORAZEPAM 2 MG/ML IJ SOLN
1.0000 mg | Freq: Once | INTRAMUSCULAR | Status: AC
  Administered 2011-03-13: 1 mg via INTRAVENOUS

## 2011-03-13 MED ORDER — ONDANSETRON HCL 4 MG/2ML IJ SOLN
4.0000 mg | Freq: Four times a day (QID) | INTRAMUSCULAR | Status: DC | PRN
  Administered 2011-03-13 – 2011-03-14 (×2): 4 mg via INTRAVENOUS
  Filled 2011-03-13 (×2): qty 2

## 2011-03-13 MED ORDER — ALBUTEROL SULFATE (5 MG/ML) 0.5% IN NEBU: 2.5000 mg | INHALATION_SOLUTION | RESPIRATORY_TRACT | Status: DC | PRN

## 2011-03-13 MED ORDER — GUAIFENESIN-DM 100-10 MG/5ML PO SYRP: 5.0000 mL | ORAL_SOLUTION | ORAL | Status: DC | PRN

## 2011-03-13 MED ORDER — SODIUM CHLORIDE 0.9 % IV SOLN
INTRAVENOUS | Status: DC
  Administered 2011-03-13 – 2011-03-14 (×2): via INTRAVENOUS

## 2011-03-13 MED ORDER — ASPIRIN 325 MG PO TABS: 325.0000 mg | ORAL_TABLET | Freq: Every day | ORAL | Status: DC

## 2011-03-13 MED ORDER — ONDANSETRON 8 MG PO TBDP
8.0000 mg | ORAL_TABLET | Freq: Once | ORAL | Status: AC
  Administered 2011-03-13: 8 mg via ORAL
  Filled 2011-03-13: qty 1

## 2011-03-13 MED ORDER — LORAZEPAM 2 MG/ML IJ SOLN
0.5000 mg | Freq: Once | INTRAMUSCULAR | Status: AC
  Administered 2011-03-13: 0.5 mg via INTRAVENOUS
  Filled 2011-03-13: qty 1

## 2011-03-13 MED ORDER — LEVOTHYROXINE SODIUM 75 MCG PO TABS
75.0000 ug | ORAL_TABLET | Freq: Every day | ORAL | Status: DC
  Administered 2011-03-15: 75 ug via ORAL
  Filled 2011-03-13 (×5): qty 1

## 2011-03-13 MED ORDER — ASPIRIN EC 325 MG PO TBEC
325.0000 mg | DELAYED_RELEASE_TABLET | Freq: Once | ORAL | Status: AC
  Administered 2011-03-13: 325 mg via ORAL
  Filled 2011-03-13: qty 1

## 2011-03-13 MED ORDER — ASPIRIN 81 MG PO CHEW
81.0000 mg | CHEWABLE_TABLET | Freq: Every day | ORAL | Status: DC
  Administered 2011-03-13 – 2011-03-15 (×3): 81 mg via ORAL
  Filled 2011-03-13 (×4): qty 1

## 2011-03-13 MED ORDER — OMEGA-3-ACID ETHYL ESTERS 1 G PO CAPS
1.0000 g | ORAL_CAPSULE | Freq: Every day | ORAL | Status: DC
  Administered 2011-03-13 – 2011-03-15 (×2): 1 g via ORAL
  Filled 2011-03-13 (×4): qty 1

## 2011-03-13 MED ORDER — PANTOPRAZOLE SODIUM 40 MG PO TBEC
40.0000 mg | DELAYED_RELEASE_TABLET | Freq: Every day | ORAL | Status: DC
  Administered 2011-03-15: 40 mg via ORAL
  Filled 2011-03-13: qty 1

## 2011-03-13 MED ORDER — LORAZEPAM 2 MG/ML IJ SOLN
INTRAMUSCULAR | Status: AC
  Administered 2011-03-13: 1 mg via INTRAVENOUS
  Filled 2011-03-13: qty 1

## 2011-03-13 MED ORDER — ROSUVASTATIN CALCIUM 10 MG PO TABS
10.0000 mg | ORAL_TABLET | Freq: Every day | ORAL | Status: DC
  Filled 2011-03-13 (×4): qty 1

## 2011-03-13 MED ORDER — MORPHINE SULFATE 4 MG/ML IJ SOLN
4.0000 mg | Freq: Once | INTRAMUSCULAR | Status: AC
  Administered 2011-03-13: 4 mg via INTRAVENOUS

## 2011-03-13 NOTE — H&P (Signed)
Kara Davis [phone]  Outpatient Primary MD for the patient is Romero Belling, MD, MD  With History of -  Past Medical History  Diagnosis Date  . VITAMIN D DEFICIENCY 05/23/2008  . HYPERCHOLESTEROLEMIA 07/27/2007  . HYPERTENSION 07/27/2007  . GERD 12/06/2006  . DIVERTICULOSIS, COLON 12/06/2006  . Irritable bowel syndrome 12/06/2006  . Osteoporosis     rx refused  . DEPRESSION 12/06/2006    Dr. Jennelle Human  . ABDOMINAL PAIN, CHRONIC 12/06/2006  . Palpitations 05/23/2008      Past Surgical History  Procedure Date  . Appendectomy   . Exploratory laparotomy 1993    secondary to abd. pain  . Thumb surgery     for fracture    in for   Chief Complaint  Patient presents with  . Headache  . Visual Field Change  . Nausea     HPI  Kara Davis  is a 76 y.o. female,  has history of essential hypertension, dyslipidemia, chronic abdominal pain due to severe irritable bowel syndrome, limit of anxiety, comes in with an episode of bilateral blury vision, she described as foggy sensation on her peripheral vision field in both eyes dated with mild generalized headache, this happened early this morning it lasted for about half an hour, during this time she does not report any focal weakness, symptoms have all resolved, she arrived to the ER about 8 out hours later for this complaint.  Score to admit the patient for a TIA workup Dr. Lyman Speller  neurologist has been consulted and he will see the patient later, CT and MRI of the brain have been unremarkable so far. Now completely symptom free.  Review of Systems    In addition to the HPI above,  No Fever-chills, No Headache, No changes with Vision or hearing, No problems swallowing food or Liquids, No Chest pain, Cough or Shortness of Breath, No Abdominal pain, No Nausea or Vommitting, Bowel movements are regular, No Blood in stool or Urine, No dysuria, No new skin rashes or bruises, No new joints pains-aches,  No new weakness,  tingling, numbness in any extremity, No recent weight gain or loss, No polyuria, polydypsia or polyphagia, No significant Mental Stressors.  A full 10 point Review of Systems was done, except as stated above, all other Review of Systems were negative.   Social History History  Substance Use Topics  . Smoking status: Never Smoker   . Smokeless tobacco: Never Used  . Alcohol Use: Yes     very rare      Family History Family History  Problem Relation Age of Onset  . Heart disease Mother   . Diabetes Brother   . Colon cancer Neg Hx   . Prostate cancer Brother       Prior to Admission medications   Medication Sig Start Date End Date Taking? Authorizing Provider  ALPRAZolam (XANAX) 0.25 MG tablet Take 0.25-0.5 mg by mouth at bedtime as needed. sleep    Yes Historical Provider, MD  amLODipine (NORVASC) 5 MG tablet Take 1 tablet (5 mg total) by mouth daily. 08/19/10  Yes Romero Belling, MD  HYDROcodone-acetaminophen (VICODIN) 5-500 MG per tablet Take 1 tab twice daily as needed for pain 02/03/11  Yes Hart Carwin, MD  levothyroxine (SYNTHROID, LEVOTHROID) 75 MCG tablet Take 1 tablet (75 mcg total) by mouth daily. 08/19/10  Yes Romero Belling, MD  Omega-3 Fatty Acids (FISH OIL) 1000 MG CAPS Take 1 capsule by mouth daily.    Yes Historical Provider, MD  omeprazole (PRILOSEC) 20 MG capsule Take 1 capsule (20 mg total) by mouth daily. 08/19/10  Yes Romero Belling, MD  vitamin D, CHOLECALCIFEROL, 400 UNITS tablet Take 400 Units by mouth daily.     Yes Historical Provider, MD    No Known Allergies  Physical Exam  Intake/Output Summary (Last 24 hours) at 03/13/11 1532 Last data filed at 03/13/11 1509  Gross per 24 hour  Intake      0 ml  Output      0 ml  Net      0 ml   Blood pressure 151/60, pulse 61, temperature 97.8 F (36.6 C), temperature source Oral, resp. rate 20, SpO2 100.00%.  1. General elderly female lying in bed in NAD,     2. Normal affect and insight, Not Suicidal or  Homicidal, Awake Alert, Oriented *3.  3. No F.N deficits, ALL C.Nerves Intact, Strength 5/5 all 4 extremities, Sensation intact all 4 extremities, Plantars down going.  4. Ears and Eyes appear Normal, Conjunctivae clear, PERRLA. Moist Oral Mucosa.  5. Supple Neck, No JVD, No cervical lymphadenopathy appriciated, No Carotid Bruits.  6. Symmetrical Chest wall movement, Good air movement bilaterally, CTAB.  7. RRR, No Gallops, Rubs or Murmurs, No Parasternal Heave.  8. Positive Bowel Sounds, Abdomen Soft, Non tender, No organomegaly appriciated,       No rebound -guarding or rigidity.  9.  No Cyanosis, Normal Skin Turgor, No Skin Rash or Bruise.  10. Good muscle tone,  joints appear normal , no effusions, Normal ROM.  11. No Palpable Lymph Nodes in Neck or Axillae     Data Review  CBC  Lab 03/13/11 0810  WBC 3.8*  HGB 13.7  HCT 40.6  PLT 287  MCV 80.2  MCH 27.1  MCHC 33.7  RDW 13.7  LYMPHSABS --  MONOABS --  EOSABS --  BASOSABS --  BANDABS --   ------------------------------------------------------------------------------------------------------------------ Chemistries   Lab 03/13/11 0810  NA 128*  K 4.2  CL 94*  CO2 25  GLUCOSE 95  BUN 8  CREATININE 0.57  CALCIUM 9.7  MG --  AST 21  ALT 16  ALKPHOS 70  BILITOT 0.3   ------------------------------------------------------------------------------------------------------------------ CrCl is unknown because both a height and weight (above a minimum accepted value) are required for this calculation. ------------------------------------------------------------------------------------------------------------------ No results found for this basename: TSH,T4TOTAL,FREET3,T3FREE,THYROIDAB in the last 72 hours  Coagulation profile No results found for this basename: INR:5,PROTIME:5 in the last 168  hours ------------------------------------------------------------------------------------------------------------------- No results found for this basename: DDIMER:2 in the last 72 hours ------------------------------------------------------------------------------------------------------------------- Cardiac Enzymes No results found for this basename: CK:3,CKMB:3,TROPONINI:3,MYOGLOBIN:3 in the last 168 hours ------------------------------------------------------------------------------------------------------------------ No results found for this basename: POCBNP:3 in the last 168 hours ------------------------------------------------------------------------------------------------------------------  Imaging results:   Ct Head Wo Contrast  03/13/2011  *RADIOLOGY REPORT*  Clinical Data: Blurred vision.  Headache.  CT HEAD WITHOUT CONTRAST  Technique:  Contiguous axial images were obtained from the base of the skull through the vertex without contrast.  Comparison: None.  Findings: There is some scattered hypoattenuation in the deep white matter consistent with chronic microvascular ischemic change.  No evidence of acute intracranial abnormality including acute infarction, hemorrhage, mass lesion, mass effect, midline shift or abnormal extra-axial fluid collection.  No hydrocephalus or pneumocephalus.  Calvarium is intact.  Imaged paranasal sinuses and mastoid air cells are clear.  IMPRESSION: No acute finding.  Original Report Authenticated By: Bernadene Bell. Maricela Curet, M.D.   Mr Brain Wo Contrast  03/13/2011  *RADIOLOGY REPORT*  Clinical  Data: Headache.  Visual distortion.  Rule out stroke.  MRI HEAD WITHOUT CONTRAST  Technique:  Multiplanar, multiecho pulse sequences of the brain and surrounding structures were obtained according to standard protocol without intravenous contrast.  Comparison: CT head without contrast 03/13/2011.  Findings: The diffusion images demonstrate no evidence for acute or  subacute infarction.  The study is mildly degraded by patient motion.  No mass lesion or acute hemorrhage is present.  Minimal subcortical white matter hyperintensities are likely within normal limits for age.  Flow is present in the major intracranial arteries.  The globes and orbits are intact.  The paranasal sinuses and mastoid air cells are clear.  IMPRESSION: Normal MRI of the brain for age.  Original Report Authenticated By: Jamesetta Orleans. MATTERN, M.D.    My personal review of EKG: Rhythm NSR, Rate 74 /min, QTc 439 , no Acute ST changes  Assessment & Plan  1. bilateral blurry vision do to possible TIA -however imperative to rule out orthostatic hypotension as patient is chronically hyponatremic and blood pressure on the lower side at this time, patient's CT and MRI brain have been negative neurology has already been consulted by the ER physician over the phone, I will admit her on a telemetry bed, will check carotid duplex and echo gram, place her on full dose aspirin along with a low dose statin, lipid panel and hemoglobin A1c will be checked, patient will be seen by PT OT and neurology. The patient has no swallowing problems whatsoever she was able to swallow a pill with a sip of water without any difficulty.  #2 Hyponatremia chronic - slight worsening at present, for now will check a urine osmolality and sodium along with serum osmolality, monitor orthostatics, this appears to be SIADH versus excessive free water intake, based on the results of the electrolyte tests IV fluids versus fluid restriction should be considered.  #3 Chronic abdominal pain due to severe irritable bowel syndrome - new home medications patient follows with Dr. Dickie La GI physician  #4 Hypothyroidism  - TSH and continue home dose Synthroid  #5. Hypertension - her on the slightly lower side we will hold her home dose Norvasc if blood pressure under 120  #6. Dyslipidemia-was on dietary restriction, lipid panel will be  checked and patient will be placed on low dose statin & cardiac diet.   DVT Prophylaxis   SCDs    AM Labs Ordered, also please review Full Orders Admission, patients condition and plan of care including tests being ordered have been discussed with the patient and daughter who indicate understanding and agree with the plan and Code Status.  Code Status Full  Condition Fair  Susa Raring M.D [facility] Office Phone [phone]

## 2011-03-13 NOTE — Consult Note (Signed)
Reason for Consult: "transient double vision"  HPI: Kara Davis is an 76 y.o. female. who woke up today at 5:30 am with bi-frontal headache that is atypical for her as well as "fogginess" of her vision that would not go away. This vision problem persisted for several hours. The patient was concerned as this sensation was new to her, so she came to ED. The symptoms resolved in ED. She denies any other weakness, numbness, tingling, clumsiness.  Past Medical History  Diagnosis Date  . VITAMIN D DEFICIENCY 05/23/2008  . HYPERCHOLESTEROLEMIA 07/27/2007  . HYPERTENSION 07/27/2007  . GERD 12/06/2006  . DIVERTICULOSIS, COLON 12/06/2006  . Irritable bowel syndrome 12/06/2006  . Osteoporosis     rx refused  . DEPRESSION 12/06/2006    Dr. Jennelle Human  . ABDOMINAL PAIN, CHRONIC 12/06/2006  . Palpitations 05/23/2008   Past Surgical History  Procedure Date  . Appendectomy   . Exploratory laparotomy 1993    secondary to abd. pain  . Thumb surgery     for fracture   Family History  Problem Relation Age of Onset  . Heart disease Mother   . Diabetes Brother   . Colon cancer Neg Hx   . Prostate cancer Brother    Social History:  reports that she has never smoked. She has never used smokeless tobacco. She reports that she drinks alcohol. She reports that she does not use illicit drugs.  Allergies: No Known Allergies  Medications:  Prior to Admission:  Prescriptions prior to admission  Medication Sig Dispense Refill  . ALPRAZolam (XANAX) 0.25 MG tablet Take 0.25-0.5 mg by mouth at bedtime as needed. sleep       . amLODipine (NORVASC) 5 MG tablet Take 1 tablet (5 mg total) by mouth daily.  30 tablet  11  . HYDROcodone-acetaminophen (VICODIN) 5-500 MG per tablet Take 1 tab twice daily as needed for pain  15 tablet  0  . levothyroxine (SYNTHROID, LEVOTHROID) 75 MCG tablet Take 1 tablet (75 mcg total) by mouth daily.  30 tablet  11  . Omega-3 Fatty Acids (FISH OIL) 1000 MG CAPS Take 1 capsule by mouth daily.        Marland Kitchen omeprazole (PRILOSEC) 20 MG capsule Take 1 capsule (20 mg total) by mouth daily.  30 capsule  11  . vitamin D, CHOLECALCIFEROL, 400 UNITS tablet Take 400 Units by mouth daily.         ROS: as above  Blood pressure 116/63, pulse 80, temperature 97.8 F (36.6 C), temperature source Oral, resp. rate 20, SpO2 100.00%.  Neurological exam: AAO*3. No aphasia.  Was able to tell me months of the year forwards and backwards correctly, exhibiting good attention span. Recall was 3 of 3 after 5 minutes. Followed complex commands. Cranial nerves: EOMI, PERRL. Visual fields were full. Sensation to V1 through V3 areas of the face was intact and symmetric throughout. There was no facial asymmetry. Hearing to finger rub was equal and symmetrical bilaterally. Motor: strength was 5/5 and symmetric throughout. Sensory: was intact throughout to light touch, pinprick, vibration and proprioception. Coordination: finger-to-nose and heel-to-shin were intact and symmetric bilaterally. Reflexes: were 2+ in upper extremities and 1+ at the knees and 1+ at the ankles. Plantar response was downgoing bilaterally. Gait: Romberg test was negative. Tandem gait, toe and heel walk was within normal limits. There was no ataxia noted.  Results for orders placed during the hospital encounter of 03/13/11 (from the past 48 hour(s))  CBC     Status:  Abnormal   Collection Time   03/13/11  8:10 AM      Component Value Range Comment   WBC 3.8 (*) 4.0 - 10.5 (K/uL)    RBC 5.06  3.87 - 5.11 (MIL/uL)    Hemoglobin 13.7  12.0 - 15.0 (g/dL)    HCT 53.6  64.4 - 03.4 (%)    MCV 80.2  78.0 - 100.0 (fL)    MCH 27.1  26.0 - 34.0 (pg)    MCHC 33.7  30.0 - 36.0 (g/dL)    RDW 74.2  59.5 - 63.8 (%)    Platelets 287  150 - 400 (K/uL)   COMPREHENSIVE METABOLIC PANEL     Status: Abnormal   Collection Time   03/13/11  8:10 AM      Component Value Range Comment   Sodium 128 (*) 135 - 145 (mEq/L)    Potassium 4.2  3.5 - 5.1 (mEq/L)    Chloride 94  (*) 96 - 112 (mEq/L)    CO2 25  19 - 32 (mEq/L)    Glucose, Bld 95  70 - 99 (mg/dL)    BUN 8  6 - 23 (mg/dL)    Creatinine, Ser 7.56  0.50 - 1.10 (mg/dL)    Calcium 9.7  8.4 - 10.5 (mg/dL)    Total Protein 7.2  6.0 - 8.3 (g/dL)    Albumin 3.8  3.5 - 5.2 (g/dL)    AST 21  0 - 37 (U/L)    ALT 16  0 - 35 (U/L)    Alkaline Phosphatase 70  39 - 117 (U/L)    Total Bilirubin 0.3  0.3 - 1.2 (mg/dL)    GFR calc non Af Amer 89 (*) >90 (mL/min)    GFR calc Af Amer >90  >90 (mL/min)   URINALYSIS, ROUTINE W REFLEX MICROSCOPIC     Status: Abnormal   Collection Time   03/13/11  8:22 AM      Component Value Range Comment   Color, Urine YELLOW  YELLOW     APPearance CLEAR  CLEAR     Specific Gravity, Urine 1.008  1.005 - 1.030     pH 7.5  5.0 - 8.0     Glucose, UA NEGATIVE  NEGATIVE (mg/dL)    Hgb urine dipstick NEGATIVE  NEGATIVE     Bilirubin Urine NEGATIVE  NEGATIVE     Ketones, ur NEGATIVE  NEGATIVE (mg/dL)    Protein, ur NEGATIVE  NEGATIVE (mg/dL)    Urobilinogen, UA 0.2  0.0 - 1.0 (mg/dL)    Nitrite NEGATIVE  NEGATIVE     Leukocytes, UA MODERATE (*) NEGATIVE    URINE MICROSCOPIC-ADD ON     Status: Abnormal   Collection Time   03/13/11  8:22 AM      Component Value Range Comment   Squamous Epithelial / LPF RARE  RARE     WBC, UA 11-20  <3 (WBC/hpf) WITH CLUMPS   Bacteria, UA MANY (*) RARE    POCT I-STAT TROPONIN I     Status: Normal   Collection Time   03/13/11  9:52 AM      Component Value Range Comment   Troponin i, poc 0.01  0.00 - 0.08 (ng/mL)    Comment 3             Ct Head Wo Contrast  03/13/2011  *RADIOLOGY REPORT*  Clinical Data: Blurred vision.  Headache.  CT HEAD WITHOUT CONTRAST  Technique:  Contiguous axial images were obtained from the  base of the skull through the vertex without contrast.  Comparison: None.  Findings: There is some scattered hypoattenuation in the deep white matter consistent with chronic microvascular ischemic change.  No evidence of acute intracranial  abnormality including acute infarction, hemorrhage, mass lesion, mass effect, midline shift or abnormal extra-axial fluid collection.  No hydrocephalus or pneumocephalus.  Calvarium is intact.  Imaged paranasal sinuses and mastoid air cells are clear.  IMPRESSION: No acute finding.  Original Report Authenticated By: Bernadene Bell. Maricela Curet, M.D.   Mr Brain Wo Contrast  03/13/2011  *RADIOLOGY REPORT*  Clinical Data: Headache.  Visual distortion.  Rule out stroke.  MRI HEAD WITHOUT CONTRAST  Technique:  Multiplanar, multiecho pulse sequences of the brain and surrounding structures were obtained according to standard protocol without intravenous contrast.  Comparison: CT head without contrast 03/13/2011.  Findings: The diffusion images demonstrate no evidence for acute or subacute infarction.  The study is mildly degraded by patient motion.  No mass lesion or acute hemorrhage is present.  Minimal subcortical white matter hyperintensities are likely within normal limits for age.  Flow is present in the major intracranial arteries.  The globes and orbits are intact.  The paranasal sinuses and mastoid air cells are clear.  IMPRESSION: Normal MRI of the brain for age.  Original Report Authenticated By: Jamesetta Orleans. MATTERN, M.D.   Assessment/Plan: 76 years old woman with possible TIA consisting of transient double vision that resolved 1) If can transfer her to [facility], will do so. If transfer will take too long, will do the work-up below here.  2) ASA 325 mg PO daily 3) MRA head and neck 4) Echo, telemetry 5) Lipid Panel/A1C 6) Will follow  BEZOV, DAVID 03/13/2011, 6:36 PM

## 2011-03-13 NOTE — Progress Notes (Signed)
Report given to [place] at [facility] 2000.

## 2011-03-13 NOTE — ED Provider Notes (Signed)
History     CSN: [id number] Arrival date & time: 03/13/2011  6:49 AM   First MD Initiated Contact with Patient 03/13/11 0726      No chief complaint on file.   (Consider location/radiation/quality/duration/timing/severity/associated sxs/prior treatment) HPI Comments: Issue with history of hypertension, high cholesterol -- presents with acute onset of diffuse headache, blurry vision, and a sense of disequilibrium at 5 AM. Patient states she experienced some loss of vision in her lateral fields. Patient was awake prior and was feeling normally. The symptoms are improving and the patient is currently walking normally and has normal vision. She still continues to have a headache and nausea. Patient denies unilateral weakness, slurred speech, confusion. Patient denies recent fever, upper respiratory infection symptoms, vomiting, chest pain, palpitations, shortness of breath, abdominal pain, urinary symptoms. Patient denies any trauma. Patient denies any previous strokes or TIAs. Patient denies any history of irregular heartbeat. Patient has never had a carotid Doppler performed. No treatments prior to arrival.  Patient is a 76 y.o. female presenting with weakness. The history is provided by the patient.  Weakness The primary symptoms include headaches, dizziness, visual change and nausea. Primary symptoms do not include loss of consciousness, paresthesias, focal weakness, loss of sensation, speech change, memory loss, fever or vomiting. The symptoms began 2 to 6 hours ago. The symptoms are improving.  The headache is associated with visual change and weakness. The headache is not associated with photophobia, eye pain, neck stiffness or paresthesias.  Dizziness also occurs with nausea and weakness. Dizziness does not occur with tinnitus or vomiting.  The visual change does not include photophobia.  Additional symptoms include weakness. Additional symptoms do not include neck stiffness, photophobia,  hearing loss or tinnitus.    Past Medical History  Diagnosis Date  . VITAMIN D DEFICIENCY 05/23/2008  . HYPERCHOLESTEROLEMIA 07/27/2007  . HYPERTENSION 07/27/2007  . GERD 12/06/2006  . DIVERTICULOSIS, COLON 12/06/2006  . Irritable bowel syndrome 12/06/2006  . Osteoporosis     rx refused  . DEPRESSION 12/06/2006    Dr. Jennelle Human    Past Surgical History  Procedure Date  . Appendectomy   . Exploratory laparotomy 1993    secondary to abd. pain  . Thumb surgery     for fracture    Family History  Problem Relation Age of Onset  . Heart disease Mother   . Diabetes Brother   . Colon cancer Neg Hx   . Prostate cancer Brother     History  Substance Use Topics  . Smoking status: Never Smoker   . Smokeless tobacco: Never Used  . Alcohol Use: Yes     very rare    OB History    Grav Para Term Preterm Abortions TAB SAB Ect Mult Living                  Review of Systems  Constitutional: Negative for fever and chills.  HENT: Negative for hearing loss, congestion, sore throat, rhinorrhea, neck stiffness and tinnitus.   Eyes: Positive for visual disturbance. Negative for photophobia, pain, discharge and redness.  Respiratory: Negative for shortness of breath.   Cardiovascular: Negative for chest pain and palpitations.  Gastrointestinal: Positive for nausea. Negative for vomiting, abdominal pain, diarrhea and constipation.  Genitourinary: Negative for dysuria and hematuria.  Musculoskeletal: Negative for myalgias.  Skin: Negative for rash.  Neurological: Positive for dizziness, weakness and headaches. Negative for speech change, focal weakness, loss of consciousness, syncope, speech difficulty and paresthesias.  Psychiatric/Behavioral: Negative for memory  loss and confusion.    Allergies  Review of patient's allergies indicates no known allergies.  Home Medications   Current Outpatient Rx  Name Route Sig Dispense Refill  . ALPRAZOLAM 0.25 MG PO TABS Oral Take 0.25-0.5 mg by mouth  at bedtime as needed. sleep     . AMLODIPINE BESYLATE 5 MG PO TABS Oral Take 1 tablet (5 mg total) by mouth daily. 30 tablet 11  . HYDROCODONE-ACETAMINOPHEN 5-500 MG PO TABS  Take 1 tab twice daily as needed for pain 15 tablet 0    RX MUST LAST 1 MONTH  . LEVOTHYROXINE SODIUM 75 MCG PO TABS Oral Take 1 tablet (75 mcg total) by mouth daily. 30 tablet 11  . FISH OIL 1000 MG PO CAPS Oral Take 1 capsule by mouth daily.     Marland Kitchen OMEPRAZOLE 20 MG PO CPDR Oral Take 1 capsule (20 mg total) by mouth daily. 30 capsule 11  . VITAMIN D 400 UNITS PO TABS Oral Take 400 Units by mouth daily.        BP 174/64  Pulse 76  Resp 18  SpO2 100%  Physical Exam  Nursing note and vitals reviewed. Constitutional: She is oriented to person, place, and time. She appears well-developed and well-nourished.  HENT:  Head: Normocephalic and atraumatic.  Eyes: Conjunctivae are normal. Pupils are equal, round, and reactive to light. Right eye exhibits no discharge. Left eye exhibits no discharge.  Neck: Normal range of motion. Neck supple.       No meningeal signs.  Cardiovascular: Normal rate, regular rhythm, normal heart sounds and intact distal pulses.  Exam reveals no gallop and no friction rub.   No murmur heard. Pulmonary/Chest: Effort normal and breath sounds normal. No respiratory distress. She has no wheezes.  Abdominal: Soft. There is no tenderness. There is no rebound and no guarding.  Musculoskeletal: Normal range of motion. She exhibits no edema.  Neurological: She is alert and oriented to person, place, and time. She has normal strength and normal reflexes. No cranial nerve deficit or sensory deficit. Coordination and gait normal. GCS eye subscore is 4. GCS verbal subscore is 5. GCS motor subscore is 6.  Skin: Skin is warm and dry. No rash noted.  Psychiatric: She has a normal mood and affect.    ED Course  Procedures (including critical care time)  Labs Reviewed  CBC - Abnormal; Notable for the  following:    WBC 3.8 (*)    All other components within normal limits  COMPREHENSIVE METABOLIC PANEL - Abnormal; Notable for the following:    Sodium 128 (*)    Chloride 94 (*)    GFR calc non Af Amer 89 (*)    All other components within normal limits  URINALYSIS, ROUTINE W REFLEX MICROSCOPIC - Abnormal; Notable for the following:    Leukocytes, UA MODERATE (*)    All other components within normal limits  URINE MICROSCOPIC-ADD ON - Abnormal; Notable for the following:    Bacteria, UA MANY (*)    All other components within normal limits  POCT I-STAT TROPONIN I  I-STAT TROPONIN I  OSMOLALITY  OSMOLALITY, URINE  SODIUM, URINE, RANDOM   Ct Head Wo Contrast  03/13/2011  *RADIOLOGY REPORT*  Clinical Data: Blurred vision.  Headache.  CT HEAD WITHOUT CONTRAST  Technique:  Contiguous axial images were obtained from the base of the skull through the vertex without contrast.  Comparison: None.  Findings: There is some scattered hypoattenuation in the deep white matter  consistent with chronic microvascular ischemic change.  No evidence of acute intracranial abnormality including acute infarction, hemorrhage, mass lesion, mass effect, midline shift or abnormal extra-axial fluid collection.  No hydrocephalus or pneumocephalus.  Calvarium is intact.  Imaged paranasal sinuses and mastoid air cells are clear.  IMPRESSION: No acute finding.  Original Report Authenticated By: Bernadene Bell. Maricela Curet, M.D.   Mr Brain Wo Contrast  03/13/2011  *RADIOLOGY REPORT*  Clinical Data: Headache.  Visual distortion.  Rule out stroke.  MRI HEAD WITHOUT CONTRAST  Technique:  Multiplanar, multiecho pulse sequences of the brain and surrounding structures were obtained according to standard protocol without intravenous contrast.  Comparison: CT head without contrast 03/13/2011.  Findings: The diffusion images demonstrate no evidence for acute or subacute infarction.  The study is mildly degraded by patient motion.  No mass  lesion or acute hemorrhage is present.  Minimal subcortical white matter hyperintensities are likely within normal limits for age.  Flow is present in the major intracranial arteries.  The globes and orbits are intact.  The paranasal sinuses and mastoid air cells are clear.  IMPRESSION: Normal MRI of the brain for age.  Original Report Authenticated By: Jamesetta Orleans. MATTERN, M.D.     1. TIA (transient ischemic attack)   2. GERD   3. HYPERTENSION   4. Abdominal pain     7:57 AM patient seen and examined. Code stroke not called due to no neurological deficits and improvement of symptoms. Workup initiated. Zofran ordered for nausea. EKG reviewed and is normal.   Date: 03/13/2011  Rate: 74  Rhythm: normal sinus rhythm  QRS Axis: normal  Intervals: normal  ST/T Wave abnormalities: normal  Conduction Disutrbances:none  Narrative Interpretation:   Old EKG Reviewed: none available  I spoke with neurologist who recommended an MRI. When MRI results returned the neurologist interviewed the patient over the telephone. Given the possible visual field cuts, neurologist recommended the patient be admitted by medicine for TIA workup. Patient and family informed and they verbalize understanding and agree with the plan.  I spoke with admitting doctor and will admit to [facility] team 6, telemetry bed.  MDM  Symptoms concerning for transient ischemic attack. Admit for further workup.        Eustace Moore [place], [place] 03/13/11 1715

## 2011-03-13 NOTE — ED Notes (Signed)
Patient transported to MRI 

## 2011-03-13 NOTE — ED Notes (Signed)
Sitting at computer this morning, had visual changes "fogged feeling" headache with nausea, continues to have headache and nausea, vision not as a noted problem. Daughter states patient was a little off balance in walking. No neuro deficits in triage

## 2011-03-13 NOTE — Progress Notes (Signed)
[facility] hospitalist transfer note for Dr. Thedore Mins. Chief complaints. Transfer note. History of present illness. Elderly female admitted with a visual and cognitive changes worrisome for TIA. She was seen in consult by neurology who requests that the patient be transferred to [facility] for further workup. I did contact the floor manager and the patient will be transferred to [facility]: To a telemetry bed. She will be followed by team 7 Dr. Gwenlyn Perking. I have notified [place] floor call of the impending transfer.

## 2011-03-13 NOTE — Progress Notes (Addendum)
I had seen and did admit orders on the patient earlier, and had done H&P on this patient personally. As per Neurology recommendation patient  was transferred to [facility], she was transferred late at night once bed opened up.

## 2011-03-13 NOTE — ED Notes (Signed)
Pt remains in MRI 

## 2011-03-13 NOTE — ED Notes (Signed)
Report given to Casimiro Needle, RN in [place], pt transferred with chart and personal belongings accompanied by family to TCU 29.

## 2011-03-13 NOTE — ED Notes (Signed)
Patient returned from MRI.

## 2011-03-13 NOTE — ED Provider Notes (Signed)
Medical screening examination/treatment/procedure(s) were conducted as a shared visit with non-physician practitioner(s) and myself.  I personally evaluated the patient during the encounter.  The patient presented with headache and visual disturbance.  When she arrived, the symptoms were improving and resolved while in ED.  Her physical exam was unremarkable.  Ct head was okay, labs unremarkable.  Consulted neurology who recommends admission for tia workup.  Geoffery Lyons, MD 03/13/11 [phone]

## 2011-03-14 ENCOUNTER — Inpatient Hospital Stay (HOSPITAL_COMMUNITY): Payer: 59

## 2011-03-14 DIAGNOSIS — M7989 Other specified soft tissue disorders: Secondary | ICD-10-CM | POA: Diagnosis present

## 2011-03-14 DIAGNOSIS — E871 Hypo-osmolality and hyponatremia: Secondary | ICD-10-CM | POA: Diagnosis present

## 2011-03-14 LAB — OSMOLALITY, URINE: Osmolality, Ur: 140 mosm/kg — ABNORMAL LOW (ref 390–1090)

## 2011-03-14 LAB — TSH: TSH: 5.844 u[IU]/mL — ABNORMAL HIGH (ref 0.350–4.500)

## 2011-03-14 LAB — LIPID PANEL
Cholesterol: 209 mg/dL — ABNORMAL HIGH (ref 0–200)
HDL: 95 mg/dL
LDL Cholesterol: 105 mg/dL — ABNORMAL HIGH (ref 0–99)
Total CHOL/HDL Ratio: 2.2 ratio
Triglycerides: 47 mg/dL
VLDL: 9 mg/dL (ref 0–40)

## 2011-03-14 LAB — CBC
MCH: 27.2 pg (ref 26.0–34.0)
MCHC: 33.6 g/dL (ref 30.0–36.0)
Platelets: 244 10*3/uL (ref 150–400)

## 2011-03-14 LAB — COMPREHENSIVE METABOLIC PANEL
Alkaline Phosphatase: 60 U/L (ref 39–117)
Calcium: 8.8 mg/dL (ref 8.4–10.5)
Creatinine, Ser: 0.71 mg/dL (ref 0.50–1.10)
GFR calc non Af Amer: 83 mL/min — ABNORMAL LOW (ref >90)
Glucose, Bld: 92 mg/dL (ref 70–99)
Sodium: 131 mEq/L — ABNORMAL LOW (ref 135–145)
Total Bilirubin: 0.2 mg/dL — ABNORMAL LOW (ref 0.3–1.2)
Total Protein: 6.2 g/dL (ref 6.0–8.3)

## 2011-03-14 LAB — HEMOGLOBIN A1C: Mean Plasma Glucose: 111 mg/dL (ref <117)

## 2011-03-14 MED ORDER — IOHEXOL 300 MG/ML  SOLN: 100.0000 mL | Freq: Once | INTRAMUSCULAR | Status: AC | PRN

## 2011-03-14 MED ORDER — SODIUM CHLORIDE 0.9 % IV BOLUS (SEPSIS)
500.0000 mL | Freq: Once | INTRAVENOUS | Status: AC
  Administered 2011-03-14: 500 mL via INTRAVENOUS

## 2011-03-14 MED ORDER — LORAZEPAM 1 MG PO TABS
1.0000 mg | ORAL_TABLET | ORAL | Status: DC | PRN
  Administered 2011-03-14 – 2011-03-15 (×3): 1 mg via ORAL
  Filled 2011-03-14 (×3): qty 1

## 2011-03-14 MED ORDER — LEVOFLOXACIN IN D5W 750 MG/150ML IV SOLN
750.0000 mg | INTRAVENOUS | Status: DC
  Administered 2011-03-14 – 2011-03-15 (×2): 750 mg via INTRAVENOUS
  Filled 2011-03-14 (×4): qty 150

## 2011-03-14 MED ORDER — CIPROFLOXACIN IN D5W 400 MG/200ML IV SOLN: 400.0000 mg | Freq: Two times a day (BID) | INTRAVENOUS | Status: DC

## 2011-03-14 NOTE — Progress Notes (Signed)
Kara Davis is a 76 y.o. female patient admitted with multiple complaints including visual field change/headache/nausea and abdominal pain. She is being investigated for TIA. Neurology appreciated. She has evidence of uti, and mentions treatment for uti about 6 months ago. She mentions that in the same period she has had low abdominal pains which she feels have some connection with the urinary bladder, as she gets pains at the same time.  SUBJECTIVE Feels better.   1. TIA (transient ischemic attack)   2. GERD   3. HYPERTENSION   4. Abdominal pain   5. Anxiety     Past Medical History  Diagnosis Date  . VITAMIN D DEFICIENCY 05/23/2008  . HYPERCHOLESTEROLEMIA 07/27/2007  . HYPERTENSION 07/27/2007  . GERD 12/06/2006  . DIVERTICULOSIS, COLON 12/06/2006  . Irritable bowel syndrome 12/06/2006  . Osteoporosis     rx refused  . DEPRESSION 12/06/2006    Dr. Jennelle Human  . ABDOMINAL PAIN, CHRONIC 12/06/2006  . Palpitations 05/23/2008   Current Facility-Administered Medications  Medication Dose Route Frequency Provider Last Rate Last Dose  . 0.9 %  sodium chloride infusion   Intravenous Continuous Mary A. Lynch 50 mL/hr at 03/14/11 0109    . albuterol (PROVENTIL) (5 MG/ML) 0.5% nebulizer solution 2.5 mg  2.5 mg Nebulization Q2H PRN Leroy Sea, MD      . amLODipine (NORVASC) tablet 5 mg  5 mg Oral Daily Leroy Sea, MD      . aspirin chewable tablet 81 mg  81 mg Oral Daily David Bezov   81 mg at 03/14/11 1127  . aspirin EC tablet 325 mg  325 mg Oral Once Leroy Sea, MD   325 mg at 03/13/11 1604  . guaiFENesin-dextromethorphan (ROBITUSSIN DM) 100-10 MG/5ML syrup 5 mL  5 mL Oral Q4H PRN Leroy Sea, MD      . HYDROcodone-acetaminophen (NORCO) 5-325 MG per tablet 1-2 tablet  1-2 tablet Oral Q4H PRN Leroy Sea, MD   1 tablet at 03/14/11 1127  . Levofloxacin (LEVAQUIN) IVPB 750 mg  750 mg Intravenous Q24H Brealynn Contino   750 mg at 03/14/11 1229  . levothyroxine (SYNTHROID,  LEVOTHROID) tablet 75 mcg  75 mcg Oral QAC breakfast Leroy Sea, MD      . LORazepam (ATIVAN) injection 0.5 mg  0.5 mg Intravenous Once Leroy Sea, MD   0.5 mg at 03/13/11 1604  . LORazepam (ATIVAN) injection 1 mg  1 mg Intravenous Once Carolee Rota, PA   1 mg at 03/13/11 1327  . LORazepam (ATIVAN) tablet 1 mg  1 mg Oral Q4H PRN Mary A. Lynch   1 mg at 03/14/11 1127  . morphine 2 MG/ML injection           . omega-3 acid ethyl esters (LOVAZA) capsule 1 g  1 g Oral Daily Leroy Sea, MD   1 g at 03/13/11 2039  . ondansetron (ZOFRAN) tablet 4 mg  4 mg Oral Q6H PRN Leroy Sea, MD   4 mg at 03/14/11 0107   Or  . ondansetron (ZOFRAN) injection 4 mg  4 mg Intravenous Q6H PRN Leroy Sea, MD   4 mg at 03/13/11 2051  . ondansetron (ZOFRAN) injection 4 mg  4 mg Intravenous Once Leroy Sea, MD   4 mg at 03/13/11 1603  . pantoprazole (PROTONIX) EC tablet 40 mg  40 mg Oral Q1200 Leroy Sea, MD      . rosuvastatin (CRESTOR) tablet 10  mg  10 mg Oral Daily Leroy Sea, MD      . sodium chloride 0.9 % bolus 500 mL  500 mL Intravenous Once Dason Mosley   500 mL at 03/14/11 1128  . DISCONTD: ALPRAZolam Prudy Feeler) tablet 0.25 mg  0.25 mg Oral TID PRN Leroy Sea, MD   0.25 mg at 03/13/11 2051  . DISCONTD: aspirin 325 MG tablet           . DISCONTD: aspirin tablet 325 mg  325 mg Oral Daily Leroy Sea, MD      . DISCONTD: ciprofloxacin (CIPRO) IVPB 400 mg  400 mg Intravenous Q12H Raniah Karan       No Known Allergies Principal Problem:  *Blurred vision, bilateral Active Problems:  HYPERCHOLESTEROLEMIA  HYPERTENSION  GERD  DIVERTICULOSIS, COLON  Irritable bowel syndrome  Palpitations  ABDOMINAL PAIN, CHRONIC  Hypothyroidism   Vital signs in last 24 hours: Temp:  [97.1 F (36.2 C)-97.7 F (36.5 C)] 97.7 F (36.5 C) (12/09 0544) Pulse Rate:  [63-88] 79  (12/09 0933) Resp:  [16-20] 18  (12/09 0544) BP: (88-134)/(56-77) 115/73 mmHg (12/09  0933) SpO2:  [95 %-100 %] 96 % (12/09 0544) Weight:  [57.1 kg (125 lb 14.1 oz)-58.423 kg (128 lb 12.8 oz)] 125 lb 14.1 oz (57.1 kg) (12/09 0017) Weight change:  Last BM Date: 03/13/11  Intake/Output from previous day: 12/08 0701 - 12/09 0700 In: 240 [P.O.:240] Out: 100 [Urine:100] Intake/Output this shift:    Lab Results:  Basename 03/14/11 0939 03/13/11 0810  WBC 3.0* 3.8*  HGB 11.9* 13.7  HCT 35.4* 40.6  PLT 244 287   BMET  Basename 03/14/11 0939 03/13/11 0810  NA 131* 128*  K 4.5 4.2  CL 96 94*  CO2 29 25  GLUCOSE 92 95  BUN 7 8  CREATININE 0.71 0.57  CALCIUM 8.8 9.7    Studies/Results: Ct Head Wo Contrast  03/13/2011  *RADIOLOGY REPORT*  Clinical Data: Blurred vision.  Headache.  CT HEAD WITHOUT CONTRAST  Technique:  Contiguous axial images were obtained from the base of the skull through the vertex without contrast.  Comparison: None.  Findings: There is some scattered hypoattenuation in the deep white matter consistent with chronic microvascular ischemic change.  No evidence of acute intracranial abnormality including acute infarction, hemorrhage, mass lesion, mass effect, midline shift or abnormal extra-axial fluid collection.  No hydrocephalus or pneumocephalus.  Calvarium is intact.  Imaged paranasal sinuses and mastoid air cells are clear.  IMPRESSION: No acute finding.  Original Report Authenticated By: Bernadene Bell. Maricela Curet, M.D.   Mr Brain Wo Contrast  03/13/2011  *RADIOLOGY REPORT*  Clinical Data: Headache.  Visual distortion.  Rule out stroke.  MRI HEAD WITHOUT CONTRAST  Technique:  Multiplanar, multiecho pulse sequences of the brain and surrounding structures were obtained according to standard protocol without intravenous contrast.  Comparison: CT head without contrast 03/13/2011.  Findings: The diffusion images demonstrate no evidence for acute or subacute infarction.  The study is mildly degraded by patient motion.  No mass lesion or acute hemorrhage is  present.  Minimal subcortical white matter hyperintensities are likely within normal limits for age.  Flow is present in the major intracranial arteries.  The globes and orbits are intact.  The paranasal sinuses and mastoid air cells are clear.  IMPRESSION: Normal MRI of the brain for age.  Original Report Authenticated By: Jamesetta Orleans. MATTERN, M.D.    Medications: I have reviewed the patient's current medications.   Physical exam  GENERAL- alert, robust and well HEAD- normal atraumatic, no neck masses, normal thyroid, no jvd RESPIRATORY- appears well, vitals normal, no respiratory distress, acyanotic, normal RR, ear and throat exam is normal, neck free of mass or lymphadenopathy, chest clear, no wheezing, crepitations, rhonchi, normal symmetric air entry CVS- regular rate and rhythm, S1, S2 normal, no murmur, click, rub or gallop ABDOMEN-healed scars, apparently from heating pad. NEURO- Grossly normal EXTREMITIES- edema left leg, which is more shiny than the right.  Plan  1. Blurred vision, bilateral/?TIA- neurology appreciated. Follow w/u- 2decho/carotid duplex. Continue asa/statin.  2. UTI/low abdominal pain with concern for fistula- obtain ct.abdomen/pelvis with contrast. Started levaquin. Follow UC. UTI maybe responsible for hypotension and orthostasis hence neurological symptoms.  3. Swollen left leg- patient seems not to have noticed this prior to today. Hominis negative. Will obtain ultrasound r/o dvt. Suspicion low, will hold off empiric anticoagulation.  4. Irritable bowel syndrome/Diverticulosis- follwoed by gi outpatient. Concern for fistula- ct abdomen/pelvis.  5. Htn- BP actually mostly low since admission. Improving with fluids. Likely element of infection.  6. Hypothyroidism on synthroid-tsh slightly elevated. Question med compliance. Continue home dose.  7. Anxiety disorder- seems stable.  8. Dvt/gi prophylaxis. Discussed plan of care with daughter.    Ayleen Mckinstry  03/14/2011 12:41 PM Pager: [phone].

## 2011-03-14 NOTE — Progress Notes (Signed)
*  PRELIMINARY RESULTS*   Carotid Dopplers completed.  No ICA stenosis.  Vertebral artery flow is antegrade.    Sherren Kerns Renee 03/14/2011, 10:31 AM

## 2011-03-14 NOTE — Progress Notes (Signed)
03/14/2011  Physical Therapy Note:  Orders received, chart reviewed, discussed pt with RN;   Noted MRA revealed an aneurysm;  Will hold PT eval at this time, and proceed with PT eval as it fits in her medical course;  Thanks, [place], [place] [phone]

## 2011-03-14 NOTE — Consults (Signed)
Subjective: Patient is doing well.  Objective: Vital signs in last 24 hours: Temp:  [97.1 F (36.2 C)-97.8 F (36.6 C)] 97.7 F (36.5 C) (12/09 0544) Pulse Rate:  [61-88] 79  (12/09 0933) Resp:  [16-20] 18  (12/09 0544) BP: (88-151)/(56-77) 115/73 mmHg (12/09 0933) SpO2:  [95 %-100 %] 96 % (12/09 0544) Weight:  [57.1 kg (125 lb 14.1 oz)-58.423 kg (128 lb 12.8 oz)] 125 lb 14.1 oz (57.1 kg) (12/09 0017)  Intake/Output from previous day: 12/08 0701 - 12/09 0700 In: 240 [P.O.:240] Out: 100 [Urine:100]   Nutritional status: Cardiac  Neurological exam: AAO*3. No aphasia. Was able to tell me months of the year forwards and backwards correctly, exhibiting good attention span. Recall was 3 of 3 after 5 minutes. Followed complex commands. Cranial nerves: EOMI, PERRL. Visual fields were full. Sensation to V1 through V3 areas of the face was intact and symmetric throughout. There was no facial asymmetry. Hearing to finger rub was equal and symmetrical bilaterally. Motor: strength was 5/5 and symmetric throughout. Sensory: was intact throughout to light touch, pinprick, vibration and proprioception. Coordination: finger-to-nose and heel-to-shin were intact and symmetric bilaterally. Reflexes: were 2+ in upper extremities and 1+ at the knees and 1+ at the ankles. Plantar response was downgoing bilaterally. Gait: Romberg test was negative. Tandem gait, toe and heel walk was within normal limits. There was no ataxia noted.  Lab Results:  [facility] 03/13/11 0810  WBC 3.8*  HGB 13.7  HCT 40.6  PLT 287  NA 128*  K 4.2  CL 94*  CO2 25  GLUCOSE 95  BUN 8  CREATININE 0.57  CALCIUM 9.7  LABA1C --   Studies/Results: Ct Head Wo Contrast  03/13/2011  *RADIOLOGY REPORT*  Clinical Data: Blurred vision.  Headache.  CT HEAD WITHOUT CONTRAST  Technique:  Contiguous axial images were obtained from the base of the skull through the vertex without contrast.  Comparison: None.  Findings: There is some  scattered hypoattenuation in the deep white matter consistent with chronic microvascular ischemic change.  No evidence of acute intracranial abnormality including acute infarction, hemorrhage, mass lesion, mass effect, midline shift or abnormal extra-axial fluid collection.  No hydrocephalus or pneumocephalus.  Calvarium is intact.  Imaged paranasal sinuses and mastoid air cells are clear.  IMPRESSION: No acute finding.  Original Report Authenticated By: Bernadene Bell. Maricela Curet, M.D.   Mr Brain Wo Contrast  03/13/2011  *RADIOLOGY REPORT*  Clinical Data: Headache.  Visual distortion.  Rule out stroke.  MRI HEAD WITHOUT CONTRAST  Technique:  Multiplanar, multiecho pulse sequences of the brain and surrounding structures were obtained according to standard protocol without intravenous contrast.  Comparison: CT head without contrast 03/13/2011.  Findings: The diffusion images demonstrate no evidence for acute or subacute infarction.  The study is mildly degraded by patient motion.  No mass lesion or acute hemorrhage is present.  Minimal subcortical white matter hyperintensities are likely within normal limits for age.  Flow is present in the major intracranial arteries.  The globes and orbits are intact.  The paranasal sinuses and mastoid air cells are clear.  IMPRESSION: Normal MRI of the brain for age.  Original Report Authenticated By: Jamesetta Orleans. MATTERN, M.D.   Medications: I have reviewed the patient's current medications.  Assessment/Plan: 76 years old woman here for TIA work-up of transient visual deficits 1) Cancelled patient's MRA neck as dopplers were already done 2) Echo, MRA head pending, telemetry 3) ASA 4) Lipid panel stat as it was cancelled twice. Patient wants to  discuss statin use with her PMD again 5) Work-up will likely finish tomorrow and she can go with PMD and outpatient neurology follow-up.   LOS: 1 day   Khadar Monger

## 2011-03-14 NOTE — Progress Notes (Signed)
  Echocardiogram 2D Echocardiogram has been performed.  Shalik Sanfilippo Nira [place]ort 03/14/2011, 12:43 PM

## 2011-03-14 NOTE — Progress Notes (Signed)
*  PRELIMINARY RESULTS* Left lower extremity venous duplex completed. No evidence of DVT, superficial thrombosis, or Baker's cyst  [place], [place] D 03/14/2011, 3:50 PM

## 2011-03-15 DIAGNOSIS — I671 Cerebral aneurysm, nonruptured: Secondary | ICD-10-CM | POA: Diagnosis present

## 2011-03-15 DIAGNOSIS — N390 Urinary tract infection, site not specified: Secondary | ICD-10-CM | POA: Diagnosis present

## 2011-03-15 LAB — CBC
MCH: 27.1 pg (ref 26.0–34.0)
MCHC: 33.6 g/dL (ref 30.0–36.0)
MCV: 80.7 fL (ref 78.0–100.0)
Platelets: 221 10*3/uL (ref 150–400)
RDW: 13.7 % (ref 11.5–15.5)

## 2011-03-15 LAB — COMPREHENSIVE METABOLIC PANEL
AST: 13 U/L (ref 0–37)
CO2: 25 mEq/L (ref 19–32)
Calcium: 8.7 mg/dL (ref 8.4–10.5)
Creatinine, Ser: 0.68 mg/dL (ref 0.50–1.10)
GFR calc non Af Amer: 84 mL/min — ABNORMAL LOW (ref >90)
Total Protein: 5.7 g/dL — ABNORMAL LOW (ref 6.0–8.3)

## 2011-03-15 MED ORDER — CIPROFLOXACIN HCL 500 MG PO TABS: 500.0000 mg | ORAL_TABLET | Freq: Two times a day (BID) | ORAL | Status: AC

## 2011-03-15 MED ORDER — ONDANSETRON HCL 4 MG PO TABS: 4.0000 mg | ORAL_TABLET | Freq: Four times a day (QID) | ORAL | Status: AC | PRN

## 2011-03-15 MED ORDER — ASPIRIN 81 MG PO CHEW: 81.0000 mg | CHEWABLE_TABLET | Freq: Every day | ORAL | Status: DC

## 2011-03-15 NOTE — Discharge Summary (Signed)
DISCHARGE SUMMARY  Kara Davis  MR#: [id number]  DOB:1934-06-02  Date of Admission: 03/13/2011 Date of Discharge: 03/15/2011  Attending Physician:Abbigail Anstey  Patient's WUJ:[id number], Gregary Signs, MD, MD  Consults:Treatment Team:  Clydene Fake Dr Danielle Dess  Discharge Diagnoses: Present on Admission:  .HYPERCHOLESTEROLEMIA .HYPERTENSION .GERD .Irritable bowel syndrome .DIVERTICULOSIS, COLON .ABDOMINAL PAIN, CHRONIC .Blurred vision, bilateral .Palpitations .Hypothyroidism .Hyponatremia .Leg swelling .Aneurysm, ophthalmic artery .UTI (lower urinary tract infection)    Current Discharge Medication List    START taking these medications   Details  aspirin 81 MG chewable tablet Chew 1 tablet (81 mg total) by mouth daily. Qty: 20 tablet, Refills: 0    ciprofloxacin (CIPRO) 500 MG tablet Take 1 tablet (500 mg total) by mouth 2 (two) times daily. Qty: 10 tablet, Refills: 0    ondansetron (ZOFRAN) 4 MG tablet Take 1 tablet (4 mg total) by mouth every 6 (six) hours as needed for nausea. Qty: 20 tablet, Refills: 0      CONTINUE these medications which have NOT CHANGED   Details  ALPRAZolam (XANAX) 0.25 MG tablet Take 0.25-0.5 mg by mouth at bedtime as needed. sleep     amLODipine (NORVASC) 5 MG tablet Take 1 tablet (5 mg total) by mouth daily. Qty: 30 tablet, Refills: 11   Associated Diagnoses: Unspecified essential hypertension    HYDROcodone-acetaminophen (VICODIN) 5-500 MG per tablet Take 1 tab twice daily as needed for pain Qty: 15 tablet, Refills: 0   Associated Diagnoses: Abdominal pain    levothyroxine (SYNTHROID, LEVOTHROID) 75 MCG tablet Take 1 tablet (75 mcg total) by mouth daily. Qty: 30 tablet, Refills: 11    Omega-3 Fatty Acids (FISH OIL) 1000 MG CAPS Take 1 capsule by mouth daily.     omeprazole (PRILOSEC) 20 MG capsule Take 1 capsule (20 mg total) by mouth daily. Qty: 30 capsule, Refills: 11   Associated Diagnoses: Esophageal reflux    vitamin D,  CHOLECALCIFEROL, 400 UNITS tablet Take 400 Units by mouth daily.            Hospital Course: Kara Davis is a pleasant lady admitted after an episode of blurred vision associated with generalized headache. She had TIA workup which incidentally showed a 2.5*3.83mm right ophthalmic aneurysm. I have reviewed the results with Dr Lyman Speller and Dr Danielle Dess who both recommended follow up MRI in 6 months or so. Patient has elected to follow with [facility] and I called the office to set up an appointment. The office will call the patient later with the date. Otherwise patient also complained of lower abdominal pain, associated with some bladder pain, raising concern for fistula. A ct abdomen/pelvis did not show significant abnormalities. Patient was found to have a UTI and is being discharged on cipro. The UTI may have caused her symptoms as her BP was also low at admission. 2decho/carotid duple were unremarkable. She has some hyperlipidemia but refuses a statin. TSH was slightly high despite being on synthroid. I did not change her synthroid dose. Will defer monitoring to Dr Everardo All. She has some anemia/neutropenia, not present on admission. I suspect this is transient but will need to be followed in the outpatient setting when she is back to her baseline. I have spent time discussing findings with patient and her daughter who is very involved in her care.   Day of Discharge BP 111/68  Pulse 68  Temp(Src) 97.2 F (36.2 C) (Oral)  Resp 18  Ht 5\' 4"  (1.626 m)  Wt 58.106 kg (128 lb 1.6 oz)  BMI  21.99 kg/m2  SpO2 98%  Physical Exam: Normal.  Results for orders placed during the hospital encounter of 03/13/11 (from the past 24 hour(s))  CBC     Status: Abnormal   Collection Time   03/15/11  5:59 AM      Component Value Range   WBC 2.4 (*) 4.0 - 10.5 (K/uL)   RBC 4.31  3.87 - 5.11 (MIL/uL)   Hemoglobin 11.7 (*) 12.0 - 15.0 (g/dL)   HCT 19.1 (*) 47.8 - 46.0 (%)   MCV 80.7  78.0 - 100.0 (fL)    MCH 27.1  26.0 - 34.0 (pg)   MCHC 33.6  30.0 - 36.0 (g/dL)   RDW 29.5  62.1 - 30.8 (%)   Platelets 221  150 - 400 (K/uL)  COMPREHENSIVE METABOLIC PANEL     Status: Abnormal   Collection Time   03/15/11  5:59 AM      Component Value Range   Sodium 136  135 - 145 (mEq/L)   Potassium 4.6  3.5 - 5.1 (mEq/L)   Chloride 105  96 - 112 (mEq/L)   CO2 25  19 - 32 (mEq/L)   Glucose, Bld 90  70 - 99 (mg/dL)   BUN 7  6 - 23 (mg/dL)   Creatinine, Ser 6.57  0.50 - 1.10 (mg/dL)   Calcium 8.7  8.4 - 84.6 (mg/dL)   Total Protein 5.7 (*) 6.0 - 8.3 (g/dL)   Albumin 2.9 (*) 3.5 - 5.2 (g/dL)   AST 13  0 - 37 (U/L)   ALT 10  0 - 35 (U/L)   Alkaline Phosphatase 55  39 - 117 (U/L)   Total Bilirubin 0.2 (*) 0.3 - 1.2 (mg/dL)   GFR calc non Af Amer 84 (*) >90 (mL/min)   GFR calc Af Amer >90  >90 (mL/min)    Disposition: home.   Follow-up Appts: Discharge Orders    Future Orders Please Complete By Expires   Diet - low sodium heart healthy      Increase activity slowly      Discharge instructions      Comments:   Follow [facility] in 2 weeks, and your regular doctor in 1 week to have CBC checked.      Follow-up with Dr.Ellison in 1 week, Dr Danielle Dess in 2 weeks.   Tests Needing Follow-up: CBC in 1 week/MRI in 6 months or per Neurosurgery. Time spent preparing for discharge-45 minutes It was a pleasure taking care of Kara Davis. Signed: Kiowa Hollar 03/15/2011, 4:09 PM

## 2011-03-15 NOTE — Progress Notes (Signed)
Pt/OT screen completed, pt. With no acute therapy needs and at baseline with ADLs and mobility. Recommend D/C home and will sign off acutely. Thanks!  Cassandria Anger, OTR/L Pager: [phone] 03/15/2011 .

## 2011-03-15 NOTE — Progress Notes (Signed)
UR COMPLETED . PT WAS ADMITTED WITH TIA/ CVA S/S.  WILL F/U ON PT/OT EVAL FOR DC NEEDS. Kara Davis 03/15/2011 [phone] OR [phone]

## 2011-03-15 NOTE — Progress Notes (Signed)
03/15/2011  Ruhenstro[place] Bing, PT [phone] (pager)

## 2011-03-16 LAB — URINE CULTURE

## 2011-03-16 NOTE — Progress Notes (Signed)
   CARE MANAGEMENT NOTE 03/16/2011  Patient:  Kara Davis, Kara Davis   Account Number:  [id number]  Date Initiated:  03/15/2011  Documentation initiated by:  Onnie Boer  Subjective/Objective Assessment:   PT WAS ADMITTED WITH TIA VS CVA S/S     Action/Plan:   PROGRESSION OF CARE AND DISCHARGE PLANNING   Anticipated DC Date:  03/19/2011   Anticipated DC Plan:  HOME W HOME HEALTH SERVICES      DC Planning Services  CM consult      Choice offered to / List presented to:             Status of service:  Completed, signed off Medicare Important Message given?   (If response is "NO", the following Medicare IM given date fields will be blank) Date Medicare IM given:   Date Additional Medicare IM given:    Discharge Disposition:  HOME/SELF CARE  Per UR Regulation:  Reviewed for med. necessity/level of care/duration of stay  Comments:  03/16/2011 Onnie Boer, RN, BSN 1627 PT DC'D TO HOME WITH SELF CARE  UR COMPLETED.  03/15/2011 Onnie Boer, RN, BSN 1514 PT WAS ADMITTED WITH TIA/CVA S/S.  WILL F/U ON DC NEEDS. PT/OT EVAL PLACED.

## 2011-03-18 ENCOUNTER — Telehealth: Payer: Self-pay | Admitting: *Deleted

## 2011-03-18 NOTE — Telephone Encounter (Signed)
Left message for pt to callback office.  

## 2011-03-18 NOTE — Telephone Encounter (Signed)
Let's do at the appt

## 2011-03-18 NOTE — Telephone Encounter (Signed)
Pt was recently seen in hospital and was treated for a UTI infection. Pt was told to F/U with PCP to make sure infection was cleared. Pt wants to come in for UA to make sure infection has been treated. Okay to place order for UA?

## 2011-03-19 ENCOUNTER — Ambulatory Visit (INDEPENDENT_AMBULATORY_CARE_PROVIDER_SITE_OTHER): Payer: 59 | Admitting: Endocrinology

## 2011-03-19 ENCOUNTER — Other Ambulatory Visit (INDEPENDENT_AMBULATORY_CARE_PROVIDER_SITE_OTHER): Payer: 59

## 2011-03-19 ENCOUNTER — Encounter: Payer: Self-pay | Admitting: Endocrinology

## 2011-03-19 DIAGNOSIS — E039 Hypothyroidism, unspecified: Secondary | ICD-10-CM

## 2011-03-19 DIAGNOSIS — D649 Anemia, unspecified: Secondary | ICD-10-CM | POA: Insufficient documentation

## 2011-03-19 DIAGNOSIS — E871 Hypo-osmolality and hyponatremia: Secondary | ICD-10-CM | POA: Insufficient documentation

## 2011-03-19 DIAGNOSIS — N390 Urinary tract infection, site not specified: Secondary | ICD-10-CM

## 2011-03-19 LAB — IBC PANEL
Saturation Ratios: 16.4 % — ABNORMAL LOW (ref 20.0–50.0)
Transferrin: 252.2 mg/dL (ref 212.0–360.0)

## 2011-03-19 LAB — BASIC METABOLIC PANEL
Chloride: 99 mEq/L (ref 96–112)
GFR: 105.69 mL/min (ref >60.00)
Glucose, Bld: 98 mg/dL (ref 70–99)
Potassium: 4.1 mEq/L (ref 3.5–5.1)
Sodium: 134 mEq/L — ABNORMAL LOW (ref 135–145)

## 2011-03-19 LAB — URINALYSIS, ROUTINE W REFLEX MICROSCOPIC
Ketones, ur: NEGATIVE
Leukocytes, UA: NEGATIVE
Nitrite: NEGATIVE
Specific Gravity, Urine: 1.01 (ref 1.000–1.030)
pH: 6 (ref 5.0–8.0)

## 2011-03-19 LAB — CBC WITH DIFFERENTIAL/PLATELET
Basophils Relative: 1.2 % (ref 0.0–3.0)
Eosinophils Relative: 3.3 % (ref 0.0–5.0)
Lymphocytes Relative: 23.7 % (ref 12.0–46.0)
Neutrophils Relative %: 62.2 % (ref 43.0–77.0)
RBC: 4.49 Mil/uL (ref 3.87–5.11)
WBC: 6.3 10*3/uL (ref 4.5–10.5)

## 2011-03-19 NOTE — Telephone Encounter (Signed)
Appoitnment scheduled 03/19/2011 1:30pm.

## 2011-03-19 NOTE — Progress Notes (Signed)
Subjective:    Patient ID: Kara Davis, female    DOB: 1935/02/09, 76 y.o.   MRN: [id number]  HPI The state of at least three ongoing medical problems is addressed today: Pt was recently in the hospital with blurry vision, and she was noted to have uti.  She was rx'ed with cipro.  Denies dysuria.   She was noted to have hyponatremia.  Denies headache. She was noted to have anemia.  No brbpr. Past Medical History  Diagnosis Date  . VITAMIN D DEFICIENCY 05/23/2008  . HYPERCHOLESTEROLEMIA 07/27/2007  . HYPERTENSION 07/27/2007  . GERD 12/06/2006  . DIVERTICULOSIS, COLON 12/06/2006  . Irritable bowel syndrome 12/06/2006  . Osteoporosis     rx refused  . DEPRESSION 12/06/2006    Dr. Jennelle Human  . ABDOMINAL PAIN, CHRONIC 12/06/2006  . Palpitations 05/23/2008    Past Surgical History  Procedure Date  . Appendectomy   . Exploratory laparotomy 1993    secondary to abd. pain  . Thumb surgery     for fracture    History   Social History  . Marital Status: Widowed    Spouse Name: N/A    Number of Children: 2  . Years of Education: N/A   Occupational History  . Unemployed    Social History Main Topics  . Smoking status: Never Smoker   . Smokeless tobacco: Never Used  . Alcohol Use: Yes     very rare  . Drug Use: No  . Sexually Active: Not on file   Other Topics Concern  . Not on file   Social History Narrative   Divorced x many yearsDaily Caffeine Use-1 cup of 1/2 caf coffee    Current Outpatient Prescriptions on File Prior to Visit  Medication Sig Dispense Refill  . ALPRAZolam (XANAX) 0.25 MG tablet Take 0.25-0.5 mg by mouth at bedtime as needed. sleep       . amLODipine (NORVASC) 5 MG tablet Take 1 tablet (5 mg total) by mouth daily.  30 tablet  11  . aspirin 81 MG chewable tablet Chew 1 tablet (81 mg total) by mouth daily.  20 tablet  0  . ciprofloxacin (CIPRO) 500 MG tablet Take 1 tablet (500 mg total) by mouth 2 (two) times daily.  10 tablet  0  . HYDROcodone-acetaminophen  (VICODIN) 5-500 MG per tablet Take 1 tab twice daily as needed for pain  15 tablet  0  . levothyroxine (SYNTHROID, LEVOTHROID) 75 MCG tablet Take 1 tablet (75 mcg total) by mouth daily.  30 tablet  11  . Omega-3 Fatty Acids (FISH OIL) 1000 MG CAPS Take 1 capsule by mouth daily.       Marland Kitchen omeprazole (PRILOSEC) 20 MG capsule Take 1 capsule (20 mg total) by mouth daily.  30 capsule  11  . ondansetron (ZOFRAN) 4 MG tablet Take 1 tablet (4 mg total) by mouth every 6 (six) hours as needed for nausea.  20 tablet  0  . vitamin D, CHOLECALCIFEROL, 400 UNITS tablet Take 400 Units by mouth daily.          No Known Allergies  Family History  Problem Relation Age of Onset  . Heart disease Mother   . Diabetes Brother   . Colon cancer Neg Hx   . Prostate cancer Brother     BP 112/62  Pulse 69  Temp(Src) 98.9 F (37.2 C) (Oral)  Ht 5\' 4"  (1.626 m)  Wt 131 lb 3.2 oz (59.512 kg)  BMI 22.52 kg/m2  SpO2 95%  Review of Systems She has slight nausea.  no hematuria.    Objective:   Physical Exam VITAL SIGNS:  See vs page GENERAL: no distress Abdomen:  No suprapubic tenderness. Ext: no edema.  Lab Results  Component Value Date   WBC 6.3 03/19/2011   HGB 12.6 03/19/2011   HCT 36.9 03/19/2011   PLT 260.0 03/19/2011   GLUCOSE 98 03/19/2011   CHOL 209* 03/14/2011   TRIG 47 03/14/2011   HDL 95 03/14/2011   LDLDIRECT 138.0 08/19/2010   LDLCALC 105* 03/14/2011   ALT 10 03/15/2011   AST 13 03/15/2011   NA 134* 03/19/2011   K 4.1 03/19/2011   CL 99 03/19/2011   CREATININE 0.6 03/19/2011   BUN 9 03/19/2011   CO2 29 03/19/2011   TSH 3.75 03/19/2011   INR 1.02 03/13/2011   HGBA1C 5.5 03/13/2011   (i reviewed ua)    Assessment & Plan:  Uti, better Hyponatremia, improved Anemia, better

## 2011-03-19 NOTE — Patient Instructions (Addendum)
blood tests are being requested for you today.  please call [phone] to hear your test results.  You will be prompted to enter the 9-digit "MRN" number that appears at the top left of this page, followed by #.  Then you will hear the message.  (update: i left message on phone-tree:  rx as we discussed) 

## 2011-04-09 ENCOUNTER — Other Ambulatory Visit: Payer: Self-pay | Admitting: *Deleted

## 2011-04-09 MED ORDER — HYDROCODONE-ACETAMINOPHEN 5-500 MG PO TABS: ORAL_TABLET | ORAL | Status: DC

## 2011-06-08 ENCOUNTER — Other Ambulatory Visit: Payer: Self-pay | Admitting: *Deleted

## 2011-06-08 MED ORDER — HYDROCODONE-ACETAMINOPHEN 5-500 MG PO TABS: ORAL_TABLET | ORAL | Status: DC

## 2011-08-04 ENCOUNTER — Other Ambulatory Visit: Payer: Self-pay | Admitting: *Deleted

## 2011-08-04 MED ORDER — HYDROCODONE-ACETAMINOPHEN 5-500 MG PO TABS: ORAL_TABLET | ORAL | Status: DC

## 2011-08-20 ENCOUNTER — Encounter: Payer: Self-pay | Admitting: Endocrinology

## 2011-08-20 ENCOUNTER — Ambulatory Visit (INDEPENDENT_AMBULATORY_CARE_PROVIDER_SITE_OTHER): Payer: 59 | Admitting: Endocrinology

## 2011-08-20 VITALS — BP 126/70 | HR 68 | Temp 98.2°F | Ht 64.0 in | Wt 127.0 lb

## 2011-08-20 DIAGNOSIS — Z0000 Encounter for general adult medical examination without abnormal findings: Secondary | ICD-10-CM

## 2011-08-20 NOTE — Patient Instructions (Addendum)
please consider these measures for your health:  minimize alcohol.  do not use tobacco products.  have a colonoscopy at least every 10 years from age 75.  Women should have an annual mammogram from age 40.  keep firearms safely stored.  always use seat belts.  have working smoke alarms in your home.  see an eye doctor and dentist regularly.  never drive under the influence of alcohol or drugs (including prescription drugs).  those with fair skin should take precautions against the sun. please let me know what your wishes would be, if artificial life support measures should become necessary.  it is critically important to prevent falling down (keep floor areas well-lit, dry, and free of loose objects.  If you have a cane, walker, or wheelchair, you should use it, even for short trips around the house.  Also, try not to rush).   Please return in 1 year.   

## 2011-08-20 NOTE — Progress Notes (Signed)
Subjective:    Patient ID: Kara Davis, female    DOB: [birth year], 77 y.o.   MRN: [id number]  HPI here for regular wellness examination.  He's feeling pretty well in general, and says chronic med probs are stable, except as noted below.  She does not know the name of the gyn she saw last year.   She refuses pneumovax, zostavax, dexa, and mammoraphy, with knowledge of the risks of refusal.   Past Medical History  Diagnosis Date  . VITAMIN D DEFICIENCY 05/23/2008  . HYPERCHOLESTEROLEMIA 07/27/2007  . HYPERTENSION 07/27/2007  . GERD 12/06/2006  . DIVERTICULOSIS, COLON 12/06/2006  . Irritable bowel syndrome 12/06/2006  . Osteoporosis     rx refused  . DEPRESSION 12/06/2006    Dr. Jennelle Human  . ABDOMINAL PAIN, CHRONIC 12/06/2006  . Palpitations 05/23/2008    Past Surgical History  Procedure Date  . Appendectomy   . Exploratory laparotomy 1993    secondary to abd. pain  . Thumb surgery     for fracture    History   Social History  . Marital Status: Widowed    Spouse Name: N/A    Number of Children: 2  . Years of Education: N/A   Occupational History  . Unemployed    Social History Main Topics  . Smoking status: Never Smoker   . Smokeless tobacco: Never Used  . Alcohol Use: Yes     very rare  . Drug Use: No  . Sexually Active: Not on file   Other Topics Concern  . Not on file   Social History Narrative   Divorced x many yearsDaily Caffeine Use-1 cup of 1/2 caf coffee    Current Outpatient Prescriptions on File Prior to Visit  Medication Sig Dispense Refill  . ALPRAZolam (XANAX) 0.25 MG tablet Take 0.25-0.5 mg by mouth at bedtime as needed. sleep       . amLODipine (NORVASC) 5 MG tablet Take 1 tablet (5 mg total) by mouth daily.  30 tablet  11  . aspirin 81 MG chewable tablet Chew 1 tablet (81 mg total) by mouth daily.  20 tablet  0  . HYDROcodone-acetaminophen (VICODIN) 5-500 MG per tablet Take 1 tab twice daily as needed for pain  15 tablet  1  . levothyroxine (SYNTHROID,  LEVOTHROID) 75 MCG tablet Take 1 tablet (75 mcg total) by mouth daily.  30 tablet  11  . Omega-3 Fatty Acids (FISH OIL) 1000 MG CAPS Take 1 capsule by mouth daily.       Marland Kitchen omeprazole (PRILOSEC) 20 MG capsule Take 1 capsule (20 mg total) by mouth daily.  30 capsule  11  . vitamin D, CHOLECALCIFEROL, 400 UNITS tablet Take 400 Units by mouth daily.          No Known Allergies  Family History  Problem Relation Age of Onset  . Heart disease Mother   . Diabetes Brother   . Colon cancer Neg Hx   . Prostate cancer Brother     BP 126/70  Pulse 68  Temp(Src) 98.2 F (36.8 C) (Oral)  Ht 5\' 4"  (1.626 m)  Wt 127 lb (57.607 kg)  BMI 21.80 kg/m2  SpO2 95%  Review of Systems  Constitutional: Negative for fever and unexpected weight change.  HENT: Negative for hearing loss.   Eyes: Negative for visual disturbance.  Respiratory: Negative for shortness of breath.   Cardiovascular: Negative for chest pain.  Gastrointestinal: Negative for anal bleeding.       No change  in chronic abd cramps  Genitourinary: Negative for dysuria and hematuria.  Musculoskeletal: Negative for back pain.  Skin: Negative for rash.  Neurological: Negative for syncope and numbness.  Hematological: Does not bruise/bleed easily.  Psychiatric/Behavioral: Negative for dysphoric mood.      Objective:   Physical Exam VS: see vs page GEN: no distress HEAD: head: no deformity eyes: no periorbital swelling, no proptosis external nose and ears are normal mouth: no lesion seen NECK: supple, thyroid is not enlarged CHEST WALL: no deformity LUNGS:  Clear to auscultation BREASTS:  sees gyn CV: reg rate and rhythm, no murmur ABD: abdomen is soft, nontender.  no hepatosplenomegaly.  not distended.  no hernia GENITALIA/RECTAL: sees gyn MUSCULOSKELETAL: muscle bulk and strength are grossly normal.  no obvious joint swelling.  gait is normal and steady EXTEMITIES: both great toes are (chronically) deviated laterally.  no  ulcer on the feet.  feet are of normal color and temp.  no edema PULSES: dorsalis pedis intact bilat.  no carotid bruit NEURO:  cn 2-12 grossly intact.   readily moves all 4's.  sensation is intact to touch on the feet SKIN:  Normal texture and temperature.  No rash or suspicious lesion is visible.   NODES:  None palpable at the neck PSYCH: alert, oriented x3.  Does not appear anxious nor depressed.    Assessment & Plan:  Wellness visit today, with problems stable, except as noted.

## 2011-09-06 ENCOUNTER — Other Ambulatory Visit: Payer: Self-pay | Admitting: Endocrinology

## 2011-10-01 DIAGNOSIS — I729 Aneurysm of unspecified site: Secondary | ICD-10-CM | POA: Insufficient documentation

## 2011-10-04 ENCOUNTER — Telehealth: Payer: Self-pay | Admitting: Internal Medicine

## 2011-10-04 NOTE — Telephone Encounter (Signed)
Last few days, abdomen tender above belly button. Pain across abdomen all across it. No fever, bleeding or diarrhea. States she does take a stool softener and this works well for her. She is having cramping and a little bit of nausea. On Saturday, she tried water and jello only and this made her feel weak. Offered to schedule patient to be seen tomorrow but she declined. She will try clear liquids and heating pad to abdomen. She states she does not need anything for pain because she has something. She would like something for cramping. Please, advise.

## 2011-10-04 NOTE — Telephone Encounter (Signed)
I gave her Levsin SL .125 mg last July 2012, please refill. #30, 1 SL q 4 hrs prn cramps

## 2011-10-05 MED ORDER — HYOSCYAMINE SULFATE 0.125 MG SL SUBL: 0.1250 mg | SUBLINGUAL_TABLET | SUBLINGUAL | Status: DC | PRN

## 2011-10-05 NOTE — Telephone Encounter (Signed)
Patient notified of recommendation. She will call back if this does not help.

## 2011-10-05 NOTE — Telephone Encounter (Signed)
Rx sent to pharmacy. Left a message for patient to call me. 

## 2011-11-03 ENCOUNTER — Other Ambulatory Visit: Payer: Self-pay | Admitting: Endocrinology

## 2012-01-03 ENCOUNTER — Other Ambulatory Visit: Payer: Self-pay | Admitting: Internal Medicine

## 2012-01-03 NOTE — Telephone Encounter (Signed)
OK to refill Vicodin. [facility].

## 2012-01-03 NOTE — Telephone Encounter (Signed)
Dr Juanda Chance- Would you like me to refill patient's vicodin? She sees Dr Marina Goodell @ [facility] apparently for pain. States that we referred her there. She has a follow up appointment with you on 02/25/12.

## 2012-01-04 MED ORDER — HYDROCODONE-ACETAMINOPHEN 5-500 MG PO TABS: ORAL_TABLET | ORAL | Status: DC

## 2012-01-04 NOTE — Telephone Encounter (Signed)
Rx sent. Left message to advise patient.

## 2012-02-25 ENCOUNTER — Encounter: Payer: Self-pay | Admitting: Internal Medicine

## 2012-02-25 ENCOUNTER — Ambulatory Visit (INDEPENDENT_AMBULATORY_CARE_PROVIDER_SITE_OTHER): Payer: 59 | Admitting: Endocrinology

## 2012-02-25 ENCOUNTER — Encounter: Payer: Self-pay | Admitting: Endocrinology

## 2012-02-25 ENCOUNTER — Ambulatory Visit (INDEPENDENT_AMBULATORY_CARE_PROVIDER_SITE_OTHER): Payer: 59 | Admitting: Internal Medicine

## 2012-02-25 VITALS — BP 124/70 | HR 75 | Temp 98.2°F | Wt 124.0 lb

## 2012-02-25 VITALS — BP 118/58 | HR 80 | Ht 62.75 in | Wt 124.5 lb

## 2012-02-25 DIAGNOSIS — R209 Unspecified disturbances of skin sensation: Secondary | ICD-10-CM

## 2012-02-25 DIAGNOSIS — M25569 Pain in unspecified knee: Secondary | ICD-10-CM

## 2012-02-25 DIAGNOSIS — R1033 Periumbilical pain: Secondary | ICD-10-CM

## 2012-02-25 DIAGNOSIS — K589 Irritable bowel syndrome without diarrhea: Secondary | ICD-10-CM

## 2012-02-25 LAB — URIC ACID: Uric Acid, Serum: 4.8 mg/dL (ref 2.4–7.0)

## 2012-02-25 LAB — SEDIMENTATION RATE: Sed Rate: 1 mm/hr (ref 0–22)

## 2012-02-25 MED ORDER — HYDROCODONE-ACETAMINOPHEN 5-500 MG PO TABS: ORAL_TABLET | ORAL | Status: DC

## 2012-02-25 MED ORDER — LINACLOTIDE 290 MCG PO CAPS: 1.0000 | ORAL_CAPSULE | Freq: Every day | ORAL | Status: DC

## 2012-02-25 NOTE — Progress Notes (Signed)
Subjective:    Patient ID: Kara Davis, female    DOB: 12-03-1934, 77 y.o.   MRN: [id number]  HPI Over the past month, pt has had 2 episodes of severe pain at the knees, radiating to the feet.  She has slight assoc tingling.  The sxs last for an hour or so, then resolve without rx.   Past Medical History  Diagnosis Date  . VITAMIN D DEFICIENCY 05/23/2008  . HYPERCHOLESTEROLEMIA 07/27/2007  . HYPERTENSION 07/27/2007  . GERD 12/06/2006  . DIVERTICULOSIS, COLON 12/06/2006  . Irritable bowel syndrome 12/06/2006  . Osteoporosis     rx refused  . DEPRESSION 12/06/2006    Dr. Jennelle Human  . ABDOMINAL PAIN, CHRONIC 12/06/2006  . Palpitations 05/23/2008  . Aneurysm     over left eye    Past Surgical History  Procedure Date  . Appendectomy   . Exploratory laparotomy 1993    secondary to abd. pain  . Thumb surgery     for fracture    History   Social History  . Marital Status: Widowed    Spouse Name: N/A    Number of Children: 2  . Years of Education: N/A   Occupational History  . Unemployed    Social History Main Topics  . Smoking status: Never Smoker   . Smokeless tobacco: Never Used  . Alcohol Use: Yes     Comment: very rare  . Drug Use: No  . Sexually Active: Not on file   Other Topics Concern  . Not on file   Social History Narrative   Divorced x many yearsDaily Caffeine Use-1 cup of 1/2 caf coffee    Current Outpatient Prescriptions on File Prior to Visit  Medication Sig Dispense Refill  . mirtazapine (REMERON) 7.5 MG tablet Take 2.5 mg by mouth at bedtime.      . ALPRAZolam (XANAX) 0.25 MG tablet Take 0.25-0.5 mg by mouth at bedtime as needed. sleep       . amLODipine (NORVASC) 5 MG tablet TAKE 1 TABLET EVERY DAY  90 tablet  3  . HYDROcodone-acetaminophen (VICODIN) 5-500 MG per tablet Take 1 tab twice daily as needed for pain-MUST LAST 1 MONTH-NOT TO BE FILLED BEFORE 03/04/12  15 tablet  1  . levothyroxine (SYNTHROID, LEVOTHROID) 75 MCG tablet TAKE 1 TABLET EVERY DAY  90  tablet  3  . Linaclotide 290 MCG CAPS Take 1 capsule by mouth daily.  12 capsule  0  . omeprazole (PRILOSEC) 20 MG capsule TAKE ONE CAPSULE EVERY DAY  90 capsule  2  . vitamin D, CHOLECALCIFEROL, 400 UNITS tablet Take 400 Units by mouth daily.          No Known Allergies  Family History  Problem Relation Age of Onset  . Heart disease Mother   . Diabetes Brother   . Colon cancer Neg Hx   . Prostate cancer Brother     BP 124/70  Pulse 75  Temp 98.2 F (36.8 C) (Oral)  Wt 124 lb (56.246 kg)  SpO2 96%  Review of Systems Denies rash and fever    Objective:   Physical Exam VITAL SIGNS:  See vs page GENERAL: no distress Knees and legs are normal to my exam. Pulses: dorsalis pedis intact bilat.   Feet: no deformity.  no ulcer on the feet.  feet are of normal color and temp.  no edema Neuro: sensation is intact to touch on the feet.   Gait: normal, steady, and painless.  Assessment & Plan:  LE pain, new, uncertain etiology

## 2012-02-25 NOTE — Patient Instructions (Addendum)
blood tests are being requested for you today.  We'll contact you with results.  

## 2012-02-25 NOTE — Progress Notes (Signed)
Kara Davis Aug 06, 1934 MRN [id number]  History of Present Illness:  This is a 77 year old white female with severe irritable bowel syndrome which has been evaluated fully by multiple physicians. She has recently seen Dr. Marina Goodell at [facility] who agreed with the diagnosis and started patient on Remeron 7.5 mg daily. Patient is breaking the tablet in half. She uses Vicodin sparingly and heating pads to her abdomen every night. The pain wakes her up at night. Her weight has been maintained around 124 pounds. She takes antispasmodics when necessary. Her last colonoscopy in 2011 was normal. A prior colonoscopy in 1995 showed hyperplastic polyps. She had a negative CT angiogram. A small bowel capsule endoscopy showed small bowel diverticuli . She had an exploratory laparotomy in 1993 with findings of ischemic small bowel in the distal jejunum to approximal ileum.   Past Medical History  Diagnosis Date  . VITAMIN D DEFICIENCY 05/23/2008  . HYPERCHOLESTEROLEMIA 07/27/2007  . HYPERTENSION 07/27/2007  . GERD 12/06/2006  . DIVERTICULOSIS, COLON 12/06/2006  . Irritable bowel syndrome 12/06/2006  . Osteoporosis     rx refused  . DEPRESSION 12/06/2006    Dr. Jennelle Human  . ABDOMINAL PAIN, CHRONIC 12/06/2006  . Palpitations 05/23/2008  . Aneurysm     over left eye   Past Surgical History  Procedure Date  . Appendectomy   . Exploratory laparotomy 1993    secondary to abd. pain  . Thumb surgery     for fracture    reports that she has never smoked. She has never used smokeless tobacco. She reports that she drinks alcohol. She reports that she does not use illicit drugs. family history includes Diabetes in her brother; Heart disease in her mother; and Prostate cancer in her brother.  There is no history of Colon cancer. No Known Allergies      Review of Systems: Denies dysphagia odynophagia fever or rectal bleeding  The remainder of the 10 point ROS is negative except as outlined in H&P   Physical Exam: General  appearance  Well developed, in no distress. Eyes- non icteric. HEENT nontraumatic, normocephalic. Mouth no lesions, tongue papillated, no cheilosis. Neck supple without adenopathy, thyroid not enlarged, no carotid bruits, no JVD. Lungs Clear to auscultation bilaterally. Cor normal S1, normal S2, regular rhythm, no murmur,  quiet precordium. Abdomen: Marked skin lesions from heating pad. No tenderness. Normal active bowel sounds. No distention well-healed surgical scar. Rectal: Not done. Extremities no pedal edema. Skin no lesions. Neurological alert and oriented x 3. Psychological normal mood and affect.  Assessment and Plan:  Problem #1 Stable Irritable bowel syndrome. We will try her on Linzess 290 mcg daily. Samples were given for 12 days. If it is helpful, we will send her prescription. If she does not  Respond to  Linzess, we may still try Amitiza 8 mg once or twice a day.Consider repeating some of the imaging studies, particularly CT enterography to look for a transition point.I still wonder if there is an organic element in her abd. Pain which wakes her up in the middle of the night from sleep   02/25/2012 Kara Davis

## 2012-02-25 NOTE — Patient Instructions (Addendum)
We have sent the following medications to your pharmacy for you to pick up at your convenience: Vicodin  We have given you samples of Linzess 290 mcg to be taken once daily.  CC: Dr Romero Belling, [facility]

## 2012-03-10 ENCOUNTER — Telehealth: Payer: Self-pay | Admitting: Endocrinology

## 2012-03-10 NOTE — Telephone Encounter (Signed)
Labs can be done after 03/18/12

## 2012-03-10 NOTE — Telephone Encounter (Signed)
Patient states that at last OV that Dr. Everardo All could not run full blood panel due to insurance not paying for it. Patient thinks the date is coming up soon, can lab orders be put in EPIC. Please contact patient to let her know when she can go for her labs.

## 2012-03-20 ENCOUNTER — Other Ambulatory Visit: Payer: 59

## 2012-03-20 ENCOUNTER — Telehealth: Payer: Self-pay | Admitting: Endocrinology

## 2012-03-20 ENCOUNTER — Ambulatory Visit (INDEPENDENT_AMBULATORY_CARE_PROVIDER_SITE_OTHER): Payer: 59 | Admitting: Endocrinology

## 2012-03-20 DIAGNOSIS — E039 Hypothyroidism, unspecified: Secondary | ICD-10-CM

## 2012-03-20 DIAGNOSIS — E7800 Pure hypercholesterolemia, unspecified: Secondary | ICD-10-CM

## 2012-03-20 DIAGNOSIS — I10 Essential (primary) hypertension: Secondary | ICD-10-CM

## 2012-03-20 DIAGNOSIS — R8281 Pyuria: Secondary | ICD-10-CM

## 2012-03-20 DIAGNOSIS — Z79899 Other long term (current) drug therapy: Secondary | ICD-10-CM

## 2012-03-20 DIAGNOSIS — Z0000 Encounter for general adult medical examination without abnormal findings: Secondary | ICD-10-CM

## 2012-03-20 DIAGNOSIS — R7309 Other abnormal glucose: Secondary | ICD-10-CM

## 2012-03-20 DIAGNOSIS — N390 Urinary tract infection, site not specified: Secondary | ICD-10-CM

## 2012-03-20 LAB — URINALYSIS, ROUTINE W REFLEX MICROSCOPIC
Bilirubin Urine: NEGATIVE
Ketones, ur: NEGATIVE
Nitrite: NEGATIVE
Specific Gravity, Urine: <=1.005
Total Protein, Urine: NEGATIVE
Urine Glucose: NEGATIVE
Urobilinogen, UA: 0.2
pH: 6 (ref 5.0–8.0)

## 2012-03-20 LAB — LIPID PANEL
HDL: 95 mg/dL (ref >39.00)
Triglycerides: 76 mg/dL (ref 0.0–149.0)

## 2012-03-20 LAB — LDL CHOLESTEROL, DIRECT: Direct LDL: 140.9 mg/dL

## 2012-03-20 LAB — HEPATIC FUNCTION PANEL
Albumin: 4 g/dL (ref 3.5–5.2)
Total Protein: 7.2 g/dL (ref 6.0–8.3)

## 2012-03-20 LAB — TSH: TSH: 2.5 u[IU]/mL (ref 0.35–5.50)

## 2012-03-20 NOTE — Telephone Encounter (Signed)
Patient called stating that she had labs drawn today and they did not include a tsh and the [facility] is waiting to see if the MD will ok them to test this with the blood they have. Please advise.

## 2012-03-20 NOTE — Patient Instructions (Addendum)
i ordered urine culture.  Please advise lab

## 2012-03-20 NOTE — Telephone Encounter (Signed)
PATIENT LAB ORDERS DONE AT [facility]. PATIENT ALSO REQUEST O URINE TEST ALSO. PLEASE ADVISE. PER VICKI AT [facility].

## 2012-03-20 NOTE — Telephone Encounter (Signed)
i ordered

## 2012-03-20 NOTE — Telephone Encounter (Signed)
PATIENT STATES SHE DID HAVE LABS DRAWN THIS MORNING AND WANTED TO KNOW IF YOU COULD ORDER ALSO FOR HER THYROID TEST TO BE DONE. STATES IT HAS BEEN OVER YEAR SINCE CHECKED AND IT IS AFTER THE 16TH NOW AND SHE FEELS INSURANCE WILL COVER THIS NOW. SHE STATES SHE HAS NOT BEEN FEELING WELL. CB# [phone]. PLEASE ADVISE

## 2012-03-20 NOTE — Telephone Encounter (Signed)
done

## 2012-03-20 NOTE — Telephone Encounter (Signed)
PATIENT NOTIFIED OF ORDERS PER DR. ELLISON FOR HER LAB WORK FOR HER THYROID TO BE CHECKED

## 2012-03-22 ENCOUNTER — Telehealth: Payer: Self-pay | Admitting: *Deleted

## 2012-03-22 ENCOUNTER — Other Ambulatory Visit: Payer: Self-pay | Admitting: Endocrinology

## 2012-03-22 LAB — URINE CULTURE: Colony Count: >=100000

## 2012-03-22 MED ORDER — CIPROFLOXACIN HCL 500 MG PO TABS: 500.0000 mg | ORAL_TABLET | Freq: Two times a day (BID) | ORAL | Status: DC

## 2012-03-22 NOTE — Telephone Encounter (Signed)
PATIENT CALLED TO MAKE SURE IT IS NECESSARY TO BE ON THIS ANTIBIOTIC FOR HER UTI. EXPLAINED TO PATIENT THAT URINE TEST AND URINE CULTURE DID SHOW THAT SHE HAD BACTERIA IN HER URINE AND PER DR. ELLISON IS NECCESSARY FOR HER TO START THIS ANTIBIOTIC. PATIENT UNDERSTOOD.

## 2012-03-24 ENCOUNTER — Telehealth: Payer: Self-pay

## 2012-03-24 DIAGNOSIS — N390 Urinary tract infection, site not specified: Secondary | ICD-10-CM

## 2012-03-24 MED ORDER — SULFAMETHOXAZOLE-TMP DS 800-160 MG PO TABS: 1.0000 | ORAL_TABLET | Freq: Two times a day (BID) | ORAL | Status: DC

## 2012-03-24 NOTE — Telephone Encounter (Signed)
Dr. Everardo All sent in Cipro for a bladder infection, but it is upsetting her stomach too much to take.  Would like something different [facility] [address]

## 2012-03-24 NOTE — Telephone Encounter (Signed)
Kara Davis, I sent Bactrim to her pharmacy - he should take a tablet twice a day for 3 days but please tell her to call us after 3 days if spx not better.

## 2012-06-07 ENCOUNTER — Other Ambulatory Visit: Payer: Self-pay | Admitting: Internal Medicine

## 2012-06-07 ENCOUNTER — Other Ambulatory Visit: Payer: Self-pay | Admitting: *Deleted

## 2012-06-07 MED ORDER — HYDROCODONE-ACETAMINOPHEN 5-325 MG PO TABS: 1.0000 | ORAL_TABLET | Freq: Two times a day (BID) | ORAL | Status: DC | PRN

## 2012-07-10 ENCOUNTER — Telehealth: Payer: Self-pay | Admitting: Endocrinology

## 2012-07-10 NOTE — Telephone Encounter (Signed)
Bladder infection has returned, wants medication refill. Sulfamethoxazole Tabs. [facility] [phone] / Roanna Raider

## 2012-07-10 NOTE — Telephone Encounter (Signed)
Please advise ov 

## 2012-07-10 NOTE — Telephone Encounter (Signed)
Spoke w. Pt. She is aware that Dr. Everardo All would like to see her regarding the bladder infection. Appt has been scheduled for 07/11/12/Sherri

## 2012-07-11 ENCOUNTER — Ambulatory Visit (INDEPENDENT_AMBULATORY_CARE_PROVIDER_SITE_OTHER): Payer: 59 | Admitting: Endocrinology

## 2012-07-11 ENCOUNTER — Encounter: Payer: Self-pay | Admitting: Endocrinology

## 2012-07-11 ENCOUNTER — Ambulatory Visit: Payer: 59 | Admitting: Endocrinology

## 2012-07-11 VITALS — BP 118/76 | HR 83 | Wt 125.0 lb

## 2012-07-11 DIAGNOSIS — N390 Urinary tract infection, site not specified: Secondary | ICD-10-CM

## 2012-07-11 LAB — URINALYSIS, ROUTINE W REFLEX MICROSCOPIC
Bilirubin Urine: NEGATIVE
Nitrite: NEGATIVE
Urobilinogen, UA: 0.2 (ref 0.0–1.0)
pH: 6.5 (ref 5.0–8.0)

## 2012-07-11 MED ORDER — CEFUROXIME AXETIL 500 MG PO TABS: 500.0000 mg | ORAL_TABLET | Freq: Two times a day (BID) | ORAL | Status: DC

## 2012-07-11 NOTE — Progress Notes (Signed)
Subjective:    Patient ID: Kara Davis, female    DOB: [birth year], 78 y.o.   MRN: [id number]  HPI Pt states few days of slight urinary frequency, but no pain at the urethra.  No assoc fever. She has been rx'ed for several uti's in the past year.  She says she has seen urology in [place] for this, but there is no record from there in epic or centricity.   Past Medical History  Diagnosis Date  . VITAMIN D DEFICIENCY 05/23/2008  . HYPERCHOLESTEROLEMIA 07/27/2007  . HYPERTENSION 07/27/2007  . GERD 12/06/2006  . DIVERTICULOSIS, COLON 12/06/2006  . Irritable bowel syndrome 12/06/2006  . Osteoporosis     rx refused  . DEPRESSION 12/06/2006    Dr. Jennelle Human  . ABDOMINAL PAIN, CHRONIC 12/06/2006  . Palpitations 05/23/2008  . Aneurysm     over left eye    Past Surgical History  Procedure Laterality Date  . Appendectomy    . Exploratory laparotomy  1993    secondary to abd. pain  . Thumb surgery      for fracture    History   Social History  . Marital Status: Widowed    Spouse Name: N/A    Number of Children: 2  . Years of Education: N/A   Occupational History  . Unemployed    Social History Main Topics  . Smoking status: Never Smoker   . Smokeless tobacco: Never Used  . Alcohol Use: Yes     Comment: very rare  . Drug Use: No  . Sexually Active: Not on file   Other Topics Concern  . Not on file   Social History Narrative   Divorced x many years   Daily Caffeine Use-1 cup of 1/2 caf coffee    Current Outpatient Prescriptions on File Prior to Visit  Medication Sig Dispense Refill  . ALPRAZolam (XANAX) 0.25 MG tablet Take 0.25-0.5 mg by mouth at bedtime as needed. sleep       . amLODipine (NORVASC) 5 MG tablet TAKE 1 TABLET EVERY DAY  90 tablet  3  . HYDROcodone-acetaminophen (NORCO/VICODIN) 5-325 MG per tablet Take 1 tablet by mouth 2 (two) times daily as needed for pain.  15 tablet  1  . levothyroxine (SYNTHROID, LEVOTHROID) 75 MCG tablet TAKE 1 TABLET EVERY DAY  90 tablet   3  . Linaclotide 290 MCG CAPS Take 1 capsule by mouth daily.  12 capsule  0  . mirtazapine (REMERON) 7.5 MG tablet Take 2.5 mg by mouth at bedtime.      Marland Kitchen omeprazole (PRILOSEC) 20 MG capsule TAKE ONE CAPSULE EVERY DAY  90 capsule  2  . sulfamethoxazole-trimethoprim (BACTRIM DS) 800-160 MG per tablet Take 1 tablet by mouth 2 (two) times daily. For 3 days  6 tablet  1  . vitamin D, CHOLECALCIFEROL, 400 UNITS tablet Take 400 Units by mouth daily.         No current facility-administered medications on file prior to visit.    No Known Allergies  Family History  Problem Relation Age of Onset  . Heart disease Mother   . Diabetes Brother   . Colon cancer Neg Hx   . Prostate cancer Brother     BP 118/76  Pulse 83  Wt 125 lb (56.7 kg)  BMI 22.32 kg/m2  SpO2 97%  Review of Systems Denies hematuria    Objective:   Physical Exam VITAL SIGNS:  See vs page GENERAL: no distress Abd: no suprapubic tenderness.    (  i reviewed ua result)    Assessment & Plan:  Urinary sxs, new, uncertain etiology

## 2012-07-11 NOTE — Patient Instructions (Addendum)
urine tests are being requested for you today.  We'll contact you with results. Please sign a release of information from the urologist. i have sent a prescription to your pharmacy, for an antibiotic.

## 2012-07-13 ENCOUNTER — Telehealth: Payer: Self-pay | Admitting: *Deleted

## 2012-07-13 NOTE — Telephone Encounter (Signed)
Pt called stating that she gave a urine specimen on Monday and was given an antibiotic. Cx was not back yet. She would like to know the results of the culture, because she does not want to begin the antibiotic if she does not need them. [phone]). Please advise.

## 2012-07-14 ENCOUNTER — Encounter: Payer: Self-pay | Admitting: Internal Medicine

## 2012-07-14 ENCOUNTER — Ambulatory Visit (INDEPENDENT_AMBULATORY_CARE_PROVIDER_SITE_OTHER): Payer: Medicare Other | Admitting: Internal Medicine

## 2012-07-14 ENCOUNTER — Other Ambulatory Visit (INDEPENDENT_AMBULATORY_CARE_PROVIDER_SITE_OTHER): Payer: Medicare Other

## 2012-07-14 ENCOUNTER — Telehealth: Payer: Self-pay | Admitting: Internal Medicine

## 2012-07-14 ENCOUNTER — Ambulatory Visit (INDEPENDENT_AMBULATORY_CARE_PROVIDER_SITE_OTHER)
Admission: RE | Admit: 2012-07-14 | Discharge: 2012-07-14 | Disposition: A | Discharge status: Home / Self Care | Payer: Medicare Other | Source: Ambulatory Visit | Attending: Internal Medicine | Admitting: Internal Medicine

## 2012-07-14 VITALS — BP 110/54 | HR 76 | Ht 62.75 in | Wt 124.4 lb

## 2012-07-14 DIAGNOSIS — R1032 Left lower quadrant pain: Secondary | ICD-10-CM

## 2012-07-14 DIAGNOSIS — R109 Unspecified abdominal pain: Secondary | ICD-10-CM

## 2012-07-14 LAB — CBC WITH DIFFERENTIAL/PLATELET
Basophils Relative: 0.6 % (ref 0.0–3.0)
Eosinophils Absolute: 0.2 10*3/uL (ref 0.0–0.7)
HCT: 37.1 % (ref 36.0–46.0)
Hemoglobin: 12.4 g/dL (ref 12.0–15.0)
Lymphs Abs: 1.8 10*3/uL (ref 0.7–4.0)
MCHC: 33.5 g/dL (ref 30.0–36.0)
MCV: 79.9 fl (ref 78.0–100.0)
Monocytes Absolute: 0.9 10*3/uL (ref 0.1–1.0)
Neutro Abs: 5.3 10*3/uL (ref 1.4–7.7)
Neutrophils Relative %: 64 % (ref 43.0–77.0)
RBC: 4.65 Mil/uL (ref 3.87–5.11)

## 2012-07-14 LAB — BUN: BUN: 7 mg/dL (ref 6–23)

## 2012-07-14 MED ORDER — AMOXICILLIN-POT CLAVULANATE 875-125 MG PO TABS: 1.0000 | ORAL_TABLET | Freq: Every day | ORAL | Status: DC

## 2012-07-14 MED ORDER — IOHEXOL 300 MG/ML  SOLN
100.0000 mL | Freq: Once | INTRAMUSCULAR | Status: AC | PRN
  Administered 2012-07-14: 80 mL via INTRAVENOUS

## 2012-07-14 NOTE — Patient Instructions (Addendum)
Your physician has requested that you go to the basement for the following lab work before leaving today: CBC, BUN/Creatinine  We have sent the following medications to your pharmacy for you to pick up at your convenience: Augmentin  You have been scheduled for a CT scan of the abdomen and pelvis at [facility] ([address]---this is in the same building as [facility]).   You are scheduled on TODAY at 4:15 pm. You should arrive 15 minutes prior to your appointment time for registration. Please follow the written instructions below on the day of your exam:  WARNING: IF YOU ARE ALLERGIC TO IODINE/X-RAY DYE, PLEASE NOTIFY RADIOLOGY IMMEDIATELY AT [phone]! YOU WILL BE GIVEN A 13 HOUR PREMEDICATION PREP.  1) Do not eat or drink anything after NOW! 2) You have been given 2 bottles of oral contrast to drink. The solution may taste better if refrigerated, but do NOT add ice or any other liquid to this solution. Shake well before drinking.    Drink 1 bottle of contrast @ NOW!) (2 hours prior to your exam)  Drink 1 bottle of contrast @ 3:25 PM (1 hour prior to your exam)  You may take any medications as prescribed with a small amount of water except for the following: Metformin, Glucophage, Glucovance, Avandamet, Riomet, Fortamet, Actoplus Met, Janumet, Glumetza or Metaglip. The above medications must be held the day of the exam AND 48 hours after the exam.  The purpose of you drinking the oral contrast is to aid in the visualization of your intestinal tract. The contrast solution may cause some diarrhea. Before your exam is started, you will be given a small amount of fluid to drink. Depending on your individual set of symptoms, you may also receive an intravenous injection of x-ray contrast/dye. Plan on being at [facility] for 30 minutes or long, depending on the type of exam you are having performed.  This test typically takes 30-45 minutes to complete.  If you  have any questions regarding your exam or if you need to reschedule, you may call the CT department at [phone] between the hours of 8:00 am and 5:00 pm, Monday-Friday.  ________________________________________________________________________  Please remain on a clear liquid diet for 48 hours.  Clear Liquid Diet The clear liquid dietconsists of foods that are liquid or will become liquid at room temperature.You should be able to see through the liquid and beverages. Examples of foods allowed on a clear liquid diet include fruit juice, broth or bouillon, gelatin, or frozen ice pops. The purpose of this diet is to provide necessary fluid, electrolytes such as sodium and potassium, and energy to keep the body functioning during times when you are not able to consume a regular diet.A clear liquid diet should not be continued for long periods of time as it is not nutritionally adequate.  REASONS FOR USING A CLEAR LIQUID DIET  In sudden onset (acute) conditions for a patient before or after surgery.  As the first step in oral feeding.  For fluid and electrolyte replacement in diarrheal diseases.  As a diet before certain medical tests are performed. ADEQUACY The clear liquid diet is adequate only in ascorbic acid, according to the Recommended Dietary Allowances of the [employer]. CHOOSING FOODS Breads and Starches  Allowed:  None are allowed.  Avoid: All are avoided. Vegetables  Allowed:  Strained tomato or vegetable juice.  Avoid: Any others. Fruit  Allowed:  Strained fruit juices and fruit drinks. Include 1  serving of citrus or vitamin C-enriched fruit juice daily.  Avoid: Any others. Meat and Meat Substitutes  Allowed:  None are allowed.  Avoid: All are avoided. Milk  Allowed:  None are allowed.  Avoid: All are avoided. Soups and Combination Foods  Allowed:  Clear bouillon, broth, or strained broth-based soups.  Avoid: Any others. Desserts and  Sweets  Allowed:  Sugar, honey. High protein gelatin. Flavored gelatin, ices, or frozen ice pops that do not contain milk.  Avoid: Any others. Fats and Oils  Allowed:  None are allowed.  Avoid: All are avoided. Beverages  Allowed: Cereal beverages, coffee (regular or decaffeinated), tea, or soda at the discretion of your caregiver.  Avoid: Any others. Condiments  Allowed:  Iodized salt.  Avoid: Any others, including pepper. Supplements  Allowed:  Liquid nutrition beverages.  Avoid: Any others that contain lactose or fiber. SAMPLE MEAL PLAN Breakfast  4 oz (120 mL) strained orange juice.   to 1 cup (125 to 250 mL) gelatin (plain or fortified).  1 cup (250 mL) beverage (coffee or tea).  Sugar, if desired. Midmorning Snack   cup (125 mL) gelatin (plain or fortified). Lunch  1 cup (250 mL) broth or consomm.  4 oz (120 mL) strained grapefruit juice.   cup (125 mL) gelatin (plain or fortified).  1 cup (250 mL) beverage (coffee or tea).  Sugar, if desired. Midafternoon Snack   cup (125 mL) fruit ice.   cup (125 mL) strained fruit juice. Dinner  1 cup (250 mL) broth or consomm.   cup (125 mL) cranberry juice.   cup (125 mL) flavored gelatin (plain or fortified).  1 cup (250 mL) beverage (coffee or tea).  Sugar, if desired. Evening Snack  4 oz (120 mL) strained apple juice (vitamin C-fortified).   cup (125 mL) flavored gelatin (plain or fortified). Document Released: 03/22/2005 Document Revised: 06/14/2011 Document Reviewed: 06/19/2010 [facility] Patient Information 2013 [place], [place].   CC: Dr Everardo All

## 2012-07-14 NOTE — Telephone Encounter (Signed)
No infection was found.  You don't need abx

## 2012-07-14 NOTE — Progress Notes (Signed)
Kara Davis Oct 01, 1934 MRN [id number]  History of Present Illness:  This is a 78 year old white female with severe irritable bowel syndrome recently evaluated at [facility] by Dr. Marina Goodell who agreed with the original diagnosis of IBS. She was started on Remeron 7.5 mg daily and takes 1/2 of the pill. She comes today with a one-day history of severe left lower quadrant abdominal pain radiating laterally. It is painful to walk and she is unable to eat. There has been no fever. Her last colonoscopy in 2011 was essentially normal. A prior colonoscopy in 1995 showed 2 hyperplastic polyps. She was evaluated in the past for ischemic bowel with a CT angiogram which was negative. A small bowel capsule endoscopy was also negative except for small bowel diverticuli. In 1993, she underwent an exploratory laparotomy for ischemic small bowel in the jejunum and proximal ileum.   Past Medical History  Diagnosis Date  . VITAMIN D DEFICIENCY 05/23/2008  . HYPERCHOLESTEROLEMIA 07/27/2007  . HYPERTENSION 07/27/2007  . GERD 12/06/2006  . DIVERTICULOSIS, COLON 12/06/2006  . Irritable bowel syndrome 12/06/2006  . Osteoporosis     rx refused  . DEPRESSION 12/06/2006    Dr. Jennelle Human  . ABDOMINAL PAIN, CHRONIC 12/06/2006  . Palpitations 05/23/2008  . Aneurysm     over left eye   Past Surgical History  Procedure Laterality Date  . Appendectomy    . Exploratory laparotomy  1993    secondary to abd. pain  . Thumb surgery      for fracture    reports that she has never smoked. She has never used smokeless tobacco. She reports that  drinks alcohol. She reports that she does not use illicit drugs. family history includes Diabetes in her brother; Heart disease in her mother; and Prostate cancer in her brother.  There is no history of Colon cancer. No Known Allergies      Review of Systems:  The remainder of the 10 point ROS is negative except as outlined in H&P   Physical Exam: General appearance  Well developed, in no  distress. Eyes- non icteric. HEENT nontraumatic, normocephalic. Mouth no lesions, tongue papillated, no cheilosis. Neck supple without adenopathy, thyroid not enlarged, no carotid bruits, no JVD. Lungs Clear to auscultation bilaterally. Cor normal S1, normal S2, regular rhythm, no murmur,  quiet precordium. Abdomen: Soft but very tender in the left middle quadrant. No rebound. Negative straight leg raising. No evidence of hernia, marked heating pad marks on her skin, quiet bowel sounds. Rectal: Not done. Extremities no pedal edema. Skin no lesions. Neurological alert and oriented x 3. Psychological normal mood and affect.  Assessment and Plan:  Problem #1 Acute onset of left lower quadrant and middle quadrant abdominal pain which is due to acute inflammation, probably of GI origin. I cannot rule out UTI or musculoskeletal pain. We will proceed with a CT scan of the abdomen and pelvis with oral and IV contrast today. We will also begin Augmentin 875 mg daily. She will stay on full liquids. We will obtainCBC today. We will need to rule out small bowel diverticulitis.since she is known to have multiple SB diverticuli.  Problem #2 Irritable bowel syndrome. This is a different kind of pain. Her usual pain is diffuse, not localized. She will continue on her IBS medications.

## 2012-07-14 NOTE — Telephone Encounter (Signed)
Spoke with patient and she ate broccoli and now she has severe abdominal pain and nausea. Denies fever or diarrhea. Pain is below belly button. Scheduled patient today at 2:00 PM.

## 2012-07-14 NOTE — Telephone Encounter (Signed)
Called pt and told her that the cx showed no signs of infection and that she does not need the antibiotic. Pt pleased.

## 2012-07-17 ENCOUNTER — Telehealth: Payer: Self-pay | Admitting: Internal Medicine

## 2012-07-17 NOTE — Telephone Encounter (Signed)
Spoke with patient and she is better than she was Friday but not back to normal. States she is sore , weak and has less pain. She is asking if she can advance diet to solid foods now. Suggested she advance slowly starting with full liquids. Please, advise.

## 2012-07-17 NOTE — Telephone Encounter (Signed)
Diverticulitis. OK to advance the diet to low residue.

## 2012-07-18 MED ORDER — GLYCOPYRROLATE 2 MG PO TABS: ORAL_TABLET | ORAL | Status: DC

## 2012-07-18 NOTE — Telephone Encounter (Signed)
Spoke with patient and gave her Dr. Regino Schultze recommendation. She thinks the antibiotic is causing her IBS to act up. She is asking if she needs to complete the antibiotic or is there something she can take for stomach cramps. Please, advise.

## 2012-07-18 NOTE — Telephone Encounter (Signed)
She needs to stay on liquids as long as she is having pain with solid food.She needs to finish the abntibiotics. Robinul Forte 2mg , #30  1 po q12 hours prn cramps, no refill

## 2012-07-18 NOTE — Telephone Encounter (Signed)
Patient given Dr. Brodie's recommendations. Rx sent to pharmacy. 

## 2012-08-18 ENCOUNTER — Telehealth: Payer: Self-pay | Admitting: Endocrinology

## 2012-08-18 MED ORDER — OMEPRAZOLE 20 MG PO CPDR: DELAYED_RELEASE_CAPSULE | ORAL | Status: DC

## 2012-08-18 NOTE — Telephone Encounter (Signed)
Wants refill - [facility] [address] [facility] [phone] / Roanna Raider

## 2012-08-21 ENCOUNTER — Encounter: Payer: 59 | Admitting: Endocrinology

## 2012-08-30 ENCOUNTER — Telehealth: Payer: Self-pay

## 2012-08-30 NOTE — Telephone Encounter (Signed)
In that case, please just refer yourself, no problem

## 2012-08-30 NOTE — Telephone Encounter (Signed)
Pt stated she will decline the refferral at this time

## 2012-08-30 NOTE — Telephone Encounter (Signed)
Pt stated that it did not make any sense that she  couldlnot keep you as her pcp if she sees another specialist and hung up the phone.

## 2012-08-30 NOTE — Telephone Encounter (Signed)
Ok, but you would need a new pcp as she does not do that.  Do you still want to be referred?

## 2012-08-30 NOTE — Telephone Encounter (Signed)
Pt called requesting an appt to see Dr. Talmage Nap, endocrinologist for 2nd opinion of thyroid [phone] or [phone]

## 2012-09-04 ENCOUNTER — Other Ambulatory Visit: Payer: Self-pay | Admitting: *Deleted

## 2012-09-04 MED ORDER — LEVOTHYROXINE SODIUM 75 MCG PO TABS: 75.0000 ug | ORAL_TABLET | Freq: Every day | ORAL | Status: DC

## 2012-09-13 ENCOUNTER — Other Ambulatory Visit: Payer: Self-pay

## 2012-09-13 MED ORDER — AMLODIPINE BESYLATE 5 MG PO TABS: ORAL_TABLET | ORAL | Status: DC

## 2012-11-02 ENCOUNTER — Telehealth: Payer: Self-pay | Admitting: Internal Medicine

## 2012-11-02 ENCOUNTER — Telehealth: Payer: Self-pay

## 2012-11-02 ENCOUNTER — Ambulatory Visit (INDEPENDENT_AMBULATORY_CARE_PROVIDER_SITE_OTHER): Payer: Medicare Other | Admitting: Nurse Practitioner

## 2012-11-02 ENCOUNTER — Encounter: Payer: Self-pay | Admitting: *Deleted

## 2012-11-02 ENCOUNTER — Other Ambulatory Visit: Payer: Medicare Other

## 2012-11-02 VITALS — BP 126/58 | HR 77 | Ht 63.5 in | Wt 124.8 lb

## 2012-11-02 DIAGNOSIS — R110 Nausea: Secondary | ICD-10-CM

## 2012-11-02 DIAGNOSIS — R300 Dysuria: Secondary | ICD-10-CM

## 2012-11-02 DIAGNOSIS — K5900 Constipation, unspecified: Secondary | ICD-10-CM | POA: Insufficient documentation

## 2012-11-02 DIAGNOSIS — R109 Unspecified abdominal pain: Secondary | ICD-10-CM

## 2012-11-02 MED ORDER — SUCRALFATE 1 GM/10ML PO SUSP: ORAL | Status: DC

## 2012-11-02 NOTE — Telephone Encounter (Signed)
Pt left voicemail, pt thinks she has a bladder infection, would like to come in for a ua, please advise [phone].

## 2012-11-02 NOTE — Progress Notes (Signed)
  History of Present Illness:  This is a 78 year old white female known to Dr. Juanda Chance. She has a history of severe irritable bowel syndrome and chronic abdominal pain. She has been worked up extensively by multiple physicians as documented in 10/28/10 office note. Patient had documented diverticulitis in April 2014 with complete resolution of symptoms with Augmentin.   Patient comes in today with burning and cramping in mid to upper abdomnen. Burning occurs everyday, crampy pain about every other day. Burning is sometimes worse with eating. She has mild intermittent nausea and frequent belching. She complains of a "swollen belly" upon rising in the am and this is a new problem. Distention improves after defecation. She complains of feeling "chilly" at times. Patient on daily PPI. No signifcant heartburn. With the exception of bloating, she has had all these symptoms before but burning more intense this time. She has chronic constipation which isn't too problematic at present  She complains of dysuria and reports problems with recent UTIs.   Current Medications, Allergies, Past Medical History, Past Surgical History, Family History and Social History were reviewed in [employer] record.  Physical Exam: General: pleasant, well developed , white female in no acute distress Head: Normocephalic and atraumatic Eyes:  sclerae anicteric, conjunctiva pink  Ears: Normal auditory acuity Lungs: Clear throughout to auscultation Heart: Regular rate and rhythm. Murmur heard best over 2nd right intercostal Abdomen: Soft, non distended, non-tender. No masses, no hepatomegaly. Normal bowel sounds. Multiple purplish / red striated areas on abdomen Musculoskeletal: Symmetrical with no gross deformities  Extremities: No edema  Neurological: Alert oriented x 4, grossly nonfocal Psychological:  Alert and cooperative. Normal mood and affect  Assessment and Recommendations: 49. 78 year old female  with several GI issues. She has a known history of severe IBS. Currently with upper abdominal burning, mild nausea, excessive belching.  Labs at annual physical in April unremarkable though I don't see a TSH. Patient feels certain she had a TSH done a couple of weeks ago but I don't see results, she will call us with results. For upper abdominal burning will try Carafate slurry. Continue daily PPI. Follow up with Dr. Juanda Chance but call in the interim if symptoms not improving.   2. Dysurina. Will check u/a and culture and call with results

## 2012-11-02 NOTE — Patient Instructions (Addendum)
Please do down to the lab for urinalysis/urine culture. We will call you with the results Please call us with the results of your thyroid labs Carafate slurry 1 g 30 minutes before meals 3 times daily, We [facility] [address] Follow up with Dr. Juanda Chance xxxx  . Call in the interim should symptoms not improve or worsen

## 2012-11-02 NOTE — Telephone Encounter (Signed)
Pt states she has been having a lot of epigastric discomfort/burning and would like to be seen. Pt scheduled to see Willette Cluster NP today at 2pm. Pt aware of appt.

## 2012-11-03 ENCOUNTER — Encounter: Payer: Self-pay | Admitting: Nurse Practitioner

## 2012-11-03 NOTE — Progress Notes (Signed)
Reviewed and agree.

## 2012-11-04 LAB — URINE CULTURE

## 2012-11-05 NOTE — Telephone Encounter (Signed)
Please advise ov with any provider [facility]

## 2012-11-06 NOTE — Telephone Encounter (Signed)
Pt advised and states she followed up with the [facility] office

## 2012-11-07 ENCOUNTER — Telehealth: Payer: Self-pay | Admitting: Nurse Practitioner

## 2012-11-07 DIAGNOSIS — Z87440 Personal history of urinary (tract) infections: Secondary | ICD-10-CM

## 2012-11-07 NOTE — Telephone Encounter (Signed)
lmom for pt to call back

## 2012-11-07 NOTE — Telephone Encounter (Signed)
Pt advised.

## 2012-11-07 NOTE — Telephone Encounter (Signed)
Informed pt of UA results and that the report was sent to Dr Everardo All. Pt stated Dr Everardo All is on vacation. Offered to ask Dr Juanda Chance about tx, but pt states she is asymptomatic. She agreed that I can send the results to Dr Everardo All and he can contact her when he returns.

## 2012-11-07 NOTE — Telephone Encounter (Signed)
Without seeing patient, i cannot rx this.  She should see someone at [facility].

## 2012-11-08 NOTE — Telephone Encounter (Signed)
I don't see any U/A from 7/31, only urine culture. Is pt having any symptoms? Paula's note reports dysuria. If she is symptomatic then I would put her on Cipro 250 mg po bid x 7 days, then repeat U/A. Is she is truly asymptomatic, then ask her to drinl lot of fluids and cranberry juice and repeat U/A in 2 weeks.

## 2012-11-08 NOTE — Telephone Encounter (Signed)
Dr Juanda Chance, this pt was seen by Willette Cluster, NP on 11/02/12 for mid to upper abdominal burning and cramping. UA done with 30,000 colonies of Klebsiella Pneum. Gunnar Fusi asked the pt to call Dr Everardo All, not knowing whether to treat or not. Pt had been informed and tried to call Dr Everardo All, but he is on vacation. Pt reported she has no s&s of a UTI. I forwarded the note to Dr Everardo All, who looked at it and would not order anything w/o seeing her.  Pt called again and would feel better if  You reviewed the UA and informed her whether she needs an AB or not. Please advise.

## 2012-11-09 NOTE — Telephone Encounter (Signed)
lmom for pt to call back

## 2012-11-10 NOTE — Telephone Encounter (Signed)
Informed pt of Dr Regino Schultze orders/recommendations. Pt states she is asymptomatic; when she saw Gunnar Fusi, she had increased frequency and some dysuria. Pt agreed to come here in 2 weeks for a repeat UA; order in.

## 2012-11-24 ENCOUNTER — Other Ambulatory Visit: Payer: Self-pay | Admitting: Internal Medicine

## 2012-11-24 ENCOUNTER — Other Ambulatory Visit (INDEPENDENT_AMBULATORY_CARE_PROVIDER_SITE_OTHER): Payer: Medicare Other

## 2012-11-24 DIAGNOSIS — N390 Urinary tract infection, site not specified: Secondary | ICD-10-CM

## 2012-11-24 DIAGNOSIS — Z87440 Personal history of urinary (tract) infections: Secondary | ICD-10-CM

## 2012-11-24 DIAGNOSIS — R300 Dysuria: Secondary | ICD-10-CM

## 2012-11-24 LAB — URINALYSIS, ROUTINE W REFLEX MICROSCOPIC
Total Protein, Urine: NEGATIVE
Urine Glucose: NEGATIVE

## 2012-11-28 ENCOUNTER — Telehealth: Payer: Self-pay | Admitting: Internal Medicine

## 2012-11-28 DIAGNOSIS — R300 Dysuria: Secondary | ICD-10-CM

## 2012-11-28 NOTE — Telephone Encounter (Signed)
Urine culture was not done as requested to lab. Patient aware and she will come in for repeat urine and culture.

## 2012-11-29 ENCOUNTER — Other Ambulatory Visit (INDEPENDENT_AMBULATORY_CARE_PROVIDER_SITE_OTHER): Payer: Medicare Other

## 2012-11-29 ENCOUNTER — Other Ambulatory Visit: Payer: Self-pay | Admitting: Endocrinology

## 2012-11-29 DIAGNOSIS — R300 Dysuria: Secondary | ICD-10-CM

## 2012-11-29 LAB — URINALYSIS, ROUTINE W REFLEX MICROSCOPIC
Nitrite: POSITIVE
Specific Gravity, Urine: 1.01 (ref 1.000–1.030)
Total Protein, Urine: NEGATIVE
Urine Glucose: NEGATIVE
pH: 7 (ref 5.0–8.0)

## 2012-12-01 ENCOUNTER — Other Ambulatory Visit: Payer: Self-pay | Admitting: *Deleted

## 2012-12-01 ENCOUNTER — Telehealth: Payer: Self-pay | Admitting: Internal Medicine

## 2012-12-01 LAB — URINE CULTURE

## 2012-12-01 MED ORDER — CEPHALEXIN 250 MG PO CAPS: ORAL_CAPSULE | ORAL | Status: DC

## 2012-12-01 NOTE — Telephone Encounter (Signed)
Good!! DB 

## 2012-12-07 ENCOUNTER — Other Ambulatory Visit: Payer: Self-pay | Admitting: Internal Medicine

## 2013-02-07 ENCOUNTER — Telehealth: Payer: Self-pay | Admitting: Internal Medicine

## 2013-02-07 MED ORDER — HYDROCODONE-ACETAMINOPHEN 5-325 MG PO TABS: ORAL_TABLET | ORAL | Status: DC

## 2013-02-07 NOTE — Telephone Encounter (Signed)
Please direct this question to [facility] who can look up if pt ready for refill.

## 2013-04-16 ENCOUNTER — Telehealth: Payer: Self-pay | Admitting: Internal Medicine

## 2013-04-16 MED ORDER — HYDROCODONE-ACETAMINOPHEN 5-325 MG PO TABS: ORAL_TABLET | ORAL | Status: DC

## 2013-04-16 NOTE — Telephone Encounter (Signed)
rx created and awaiting Dr Brodie's signature. 

## 2013-04-20 ENCOUNTER — Encounter: Payer: Self-pay | Admitting: Nurse Practitioner

## 2013-04-20 ENCOUNTER — Ambulatory Visit (INDEPENDENT_AMBULATORY_CARE_PROVIDER_SITE_OTHER): Payer: Medicare Other | Admitting: Nurse Practitioner

## 2013-04-20 VITALS — BP 158/68 | HR 64 | Ht 62.6 in | Wt 125.8 lb

## 2013-04-20 DIAGNOSIS — R141 Gas pain: Secondary | ICD-10-CM

## 2013-04-20 DIAGNOSIS — R143 Flatulence: Secondary | ICD-10-CM

## 2013-04-20 DIAGNOSIS — K219 Gastro-esophageal reflux disease without esophagitis: Secondary | ICD-10-CM

## 2013-04-20 DIAGNOSIS — R142 Eructation: Secondary | ICD-10-CM

## 2013-04-20 MED ORDER — ESOMEPRAZOLE MAGNESIUM 40 MG PO CPDR: 40.0000 mg | DELAYED_RELEASE_CAPSULE | Freq: Every day | ORAL | Status: DC

## 2013-04-20 NOTE — Progress Notes (Signed)
     History of Present Illness:   Patient is a 79 year old female known to Dr. Olevia Perches. She has a history of severe irritable bowel syndrome, has been extensively evaluated by multiple physicians . At her last visit with Dr. Olevia Perches in 2013 patient was tried on White. She apparently didn't tolerate the medications but cannot remember specifics. Patient was last seen by me in late July of this year when she presented with upper abdominal burning. We tried Carafate, it isn't clear if she tolerated it. Dr. Olevia Perches refilled her hydrocodone on the twelfth of this month.   Kara Davis presents today for evaluation of indigestion which she describes as belching and refluxing of food debris. No abdominal pain to speak up. No significant bloating. No nausea.   Current Medications, Allergies, Past Medical History, Past Surgical History, Family History and Social History were reviewed in [employer] record.  Physical Exam: General: Pleasant, well developed , white female in no acute distress. Frequent lip smacking Head: Normocephalic and atraumatic Eyes:  sclerae anicteric, conjunctiva pink  Ears: Normal auditory acuity Lungs: Clear throughout to auscultation Heart: Regular rate and rhythm Abdomen: Soft, non distended, non-tender. No masses, no hepatomegaly. Normal bowel sounds Musculoskeletal: Symmetrical with no gross deformities  Extremities: No edema  Neurological: Alert oriented x 4, grossly nonfocal Psychological:  Alert and cooperative. Normal mood and affect  Assessment and Recommendations:  79 -year-old female with severe irritable bowel syndrome. She presents with excessive belching associated with regurgitation of food particles. Patient has a history of GERD, she takes 20 mg of omeprazole daily. No pyrosis to speak of. We discussed some causes of excessive belching (aerophagia, GERD, carbonated drinks, etc...). I am unsure why she's had a sudden increase in belching.  The regurgitation of food particles does point to ongoing reflux. We discussed increasing her PPI, she is a little nervous about doing this because of her history of osteoporosis. Patient will temporarily increase her PPI to 40 mg daily. I gave her samples of Nexium. In addition, I did give her a low gas diet as she does tend to eat brussel sprouts and other gas producing foods. She can followup with Dr. Olevia Perches as needed

## 2013-04-20 NOTE — Patient Instructions (Signed)
WE have given  You a Gas prevention brochure. We have given you samples of Nexium 40 mg. Take 1 cap 30 min before breakfast.  If this helps call us when  You are a week out of being out of the samples and we can do a prescription.

## 2013-04-20 NOTE — Progress Notes (Signed)
Reviewed and agree. I will see her in follow up.

## 2013-06-27 ENCOUNTER — Telehealth: Payer: Self-pay | Admitting: Internal Medicine

## 2013-06-27 MED ORDER — HYDROCODONE-ACETAMINOPHEN 5-325 MG PO TABS: ORAL_TABLET | ORAL | Status: DC

## 2013-06-27 NOTE — Telephone Encounter (Signed)
Rx created and awaiting Dr Brodie's signature. 

## 2013-07-05 ENCOUNTER — Telehealth: Payer: Self-pay | Admitting: Internal Medicine

## 2013-07-05 MED ORDER — HYDROCODONE-ACETAMINOPHEN 5-325 MG PO TABS: ORAL_TABLET | ORAL | Status: DC

## 2013-07-05 NOTE — Telephone Encounter (Signed)
Rx is ready for pick up today. Left message advising pt.

## 2013-09-04 ENCOUNTER — Telehealth: Payer: Self-pay | Admitting: Internal Medicine

## 2013-09-04 MED ORDER — HYDROCODONE-ACETAMINOPHEN 5-325 MG PO TABS: ORAL_TABLET | ORAL | Status: DC

## 2013-09-04 NOTE — Telephone Encounter (Signed)
Rx created and awaiting Dr Brodie's signature. Patient advised. 

## 2013-11-06 ENCOUNTER — Telehealth: Payer: Self-pay | Admitting: Internal Medicine

## 2013-11-06 MED ORDER — HYDROCODONE-ACETAMINOPHEN 5-325 MG PO TABS: ORAL_TABLET | ORAL | Status: DC

## 2013-11-06 NOTE — Telephone Encounter (Signed)
Per Alonza Bogus, PA-C she will okay rx until Dr Olevia Perches returns to the office. Rx signed by Janett Billow and placed at front desk for patient pick up. Patient advised.

## 2014-01-08 ENCOUNTER — Telehealth: Payer: Self-pay | Admitting: Internal Medicine

## 2014-01-08 MED ORDER — HYDROCODONE-ACETAMINOPHEN 5-325 MG PO TABS: ORAL_TABLET | ORAL | Status: DC

## 2014-01-08 NOTE — Telephone Encounter (Signed)
Rx created and awaiting Dr Brodie's signature. 

## 2014-03-11 ENCOUNTER — Telehealth: Payer: Self-pay | Admitting: Internal Medicine

## 2014-03-11 MED ORDER — HYDROCODONE-ACETAMINOPHEN 5-325 MG PO TABS: ORAL_TABLET | ORAL | Status: DC

## 2014-03-11 NOTE — Telephone Encounter (Signed)
Rx sent 

## 2014-05-13 ENCOUNTER — Telehealth: Payer: Self-pay | Admitting: Internal Medicine

## 2014-05-14 MED ORDER — HYDROCODONE-ACETAMINOPHEN 5-325 MG PO TABS: ORAL_TABLET | ORAL | Status: DC

## 2014-05-14 NOTE — Telephone Encounter (Signed)
Printed out Rx for Hydrocodone-acetaminophen (NORCO/VICODIN) 5-325 mg per tablet. 15 tablets to last until her next appointment, which has been scheduled for 06/21/2014. Left a message on the patient's home number at [phone] to let them know they need to pick up Rx from our office from the receptionist.

## 2014-05-24 ENCOUNTER — Telehealth: Payer: Self-pay | Admitting: Internal Medicine

## 2014-05-24 NOTE — Telephone Encounter (Signed)
Patient states she woke up this AM with abdominal pain at the belly button area. She states the pain in more intense than her usual pain. She states it hurts to walk. Also report chills and nausea without vomiting. No extender appointments and Dr. Olevia Perches schedule is full. Please, advise.

## 2014-05-25 ENCOUNTER — Emergency Department (HOSPITAL_COMMUNITY): Payer: Medicare Other

## 2014-05-25 ENCOUNTER — Emergency Department (HOSPITAL_COMMUNITY)
Admission: EM | Admit: 2014-05-25 | Discharge: 2014-05-25 | Disposition: A | Discharge status: Home / Self Care | Payer: Medicare Other | Attending: Emergency Medicine | Admitting: Emergency Medicine

## 2014-05-25 ENCOUNTER — Encounter (HOSPITAL_COMMUNITY): Payer: Self-pay | Admitting: Emergency Medicine

## 2014-05-25 DIAGNOSIS — I729 Aneurysm of unspecified site: Secondary | ICD-10-CM | POA: Insufficient documentation

## 2014-05-25 DIAGNOSIS — K5732 Diverticulitis of large intestine without perforation or abscess without bleeding: Secondary | ICD-10-CM | POA: Diagnosis not present

## 2014-05-25 DIAGNOSIS — F329 Major depressive disorder, single episode, unspecified: Secondary | ICD-10-CM | POA: Insufficient documentation

## 2014-05-25 DIAGNOSIS — Z79899 Other long term (current) drug therapy: Secondary | ICD-10-CM | POA: Insufficient documentation

## 2014-05-25 DIAGNOSIS — M810 Age-related osteoporosis without current pathological fracture: Secondary | ICD-10-CM | POA: Diagnosis not present

## 2014-05-25 DIAGNOSIS — N390 Urinary tract infection, site not specified: Secondary | ICD-10-CM | POA: Diagnosis not present

## 2014-05-25 DIAGNOSIS — R1032 Left lower quadrant pain: Secondary | ICD-10-CM | POA: Diagnosis present

## 2014-05-25 DIAGNOSIS — E559 Vitamin D deficiency, unspecified: Secondary | ICD-10-CM | POA: Diagnosis not present

## 2014-05-25 DIAGNOSIS — R109 Unspecified abdominal pain: Secondary | ICD-10-CM

## 2014-05-25 DIAGNOSIS — I10 Essential (primary) hypertension: Secondary | ICD-10-CM | POA: Insufficient documentation

## 2014-05-25 DIAGNOSIS — Z8719 Personal history of other diseases of the digestive system: Secondary | ICD-10-CM | POA: Insufficient documentation

## 2014-05-25 DIAGNOSIS — Z90710 Acquired absence of both cervix and uterus: Secondary | ICD-10-CM | POA: Diagnosis not present

## 2014-05-25 LAB — COMPREHENSIVE METABOLIC PANEL
ALBUMIN: 4.3 g/dL (ref 3.5–5.2)
ALT: 18 U/L (ref 0–35)
ANION GAP: 9 (ref 5–15)
AST: 26 U/L (ref 0–37)
Alkaline Phosphatase: 64 U/L (ref 39–117)
BUN: 10 mg/dL (ref 6–23)
CALCIUM: 8.9 mg/dL (ref 8.4–10.5)
CHLORIDE: 100 mmol/L (ref 96–112)
CO2: 23 mmol/L (ref 19–32)
Creatinine, Ser: 0.64 mg/dL (ref 0.50–1.10)
GFR calc Af Amer: >90 mL/min (ref >90)
GFR, EST NON AFRICAN AMERICAN: 84 mL/min — AB (ref >90)
GLUCOSE: 91 mg/dL (ref 70–99)
POTASSIUM: 4 mmol/L (ref 3.5–5.1)
Sodium: 132 mmol/L — ABNORMAL LOW (ref 135–145)
TOTAL PROTEIN: 7.7 g/dL (ref 6.0–8.3)
Total Bilirubin: 0.5 mg/dL (ref 0.3–1.2)

## 2014-05-25 LAB — CBC WITH DIFFERENTIAL/PLATELET
Basophils Absolute: 0.1 10*3/uL (ref 0.0–0.1)
Basophils Relative: 1 % (ref 0–1)
EOS ABS: 0.1 10*3/uL (ref 0.0–0.7)
Eosinophils Relative: 1 % (ref 0–5)
HCT: 40.6 % (ref 36.0–46.0)
Hemoglobin: 13.5 g/dL (ref 12.0–15.0)
Lymphocytes Relative: 23 % (ref 12–46)
Lymphs Abs: 1.9 10*3/uL (ref 0.7–4.0)
MCH: 27.1 pg (ref 26.0–34.0)
MCHC: 33.3 g/dL (ref 30.0–36.0)
MCV: 81.4 fL (ref 78.0–100.0)
MONOS PCT: 9 % (ref 3–12)
Monocytes Absolute: 0.7 10*3/uL (ref 0.1–1.0)
NEUTROS ABS: 5.4 10*3/uL (ref 1.7–7.7)
NEUTROS PCT: 66 % (ref 43–77)
PLATELETS: 259 10*3/uL (ref 150–400)
RBC: 4.99 MIL/uL (ref 3.87–5.11)
RDW: 13.5 % (ref 11.5–15.5)
WBC: 8.1 10*3/uL (ref 4.0–10.5)

## 2014-05-25 LAB — URINALYSIS, ROUTINE W REFLEX MICROSCOPIC
BILIRUBIN URINE: NEGATIVE
Glucose, UA: NEGATIVE mg/dL
Ketones, ur: 15 mg/dL — AB
Nitrite: NEGATIVE
PH: 5.5 (ref 5.0–8.0)
PROTEIN: NEGATIVE mg/dL
Specific Gravity, Urine: 1.01 (ref 1.005–1.030)
UROBILINOGEN UA: 0.2 mg/dL (ref 0.0–1.0)

## 2014-05-25 LAB — LIPASE, BLOOD: LIPASE: 21 U/L (ref 11–59)

## 2014-05-25 LAB — TROPONIN I: Troponin I: <0.03 ng/mL (ref <0.031)

## 2014-05-25 LAB — URINE MICROSCOPIC-ADD ON

## 2014-05-25 MED ORDER — HYDROMORPHONE HCL 1 MG/ML IJ SOLN: 0.5000 mg | Freq: Once | INTRAMUSCULAR | Status: AC

## 2014-05-25 MED ORDER — HYDROMORPHONE HCL 1 MG/ML IJ SOLN
0.5000 mg | Freq: Once | INTRAMUSCULAR | Status: AC
  Administered 2014-05-25: 0.5 mg via INTRAVENOUS
  Filled 2014-05-25: qty 1

## 2014-05-25 MED ORDER — IOHEXOL 300 MG/ML  SOLN
50.0000 mL | Freq: Once | INTRAMUSCULAR | Status: AC | PRN
  Administered 2014-05-25: 50 mL via ORAL

## 2014-05-25 MED ORDER — ONDANSETRON HCL 4 MG/2ML IJ SOLN
4.0000 mg | Freq: Once | INTRAMUSCULAR | Status: AC
  Administered 2014-05-25: 4 mg via INTRAVENOUS
  Filled 2014-05-25: qty 2

## 2014-05-25 MED ORDER — IOHEXOL 300 MG/ML  SOLN
100.0000 mL | Freq: Once | INTRAMUSCULAR | Status: AC | PRN
  Administered 2014-05-25: 100 mL via INTRAVENOUS

## 2014-05-25 MED ORDER — CIPROFLOXACIN HCL 500 MG PO TABS: 500.0000 mg | ORAL_TABLET | Freq: Two times a day (BID) | ORAL | Status: DC

## 2014-05-25 MED ORDER — DEXTROSE 5 % IV SOLN: 1.0000 g | INTRAVENOUS | Status: DC

## 2014-05-25 MED ORDER — METRONIDAZOLE 500 MG PO TABS: 500.0000 mg | ORAL_TABLET | Freq: Two times a day (BID) | ORAL | Status: DC

## 2014-05-25 MED ORDER — HYDROMORPHONE HCL 2 MG/ML IJ SOLN
INTRAMUSCULAR | Status: AC
  Administered 2014-05-25: 0.5 mg
  Filled 2014-05-25: qty 1

## 2014-05-25 MED ORDER — OXYCODONE-ACETAMINOPHEN 5-325 MG PO TABS: 1.0000 | ORAL_TABLET | ORAL | Status: DC | PRN

## 2014-05-25 NOTE — Discharge Instructions (Signed)
Call your doctor Monday to schedule a follow-up visit next week Diverticulitis Diverticulitis is inflammation or infection of small pouches in your colon that form when you have a condition called diverticulosis. The pouches in your colon are called diverticula. Your colon, or large intestine, is where water is absorbed and stool is formed. Complications of diverticulitis can include:  Bleeding.  Severe infection.  Severe pain.  Perforation of your colon.  Obstruction of your colon. CAUSES  Diverticulitis is caused by bacteria. Diverticulitis happens when stool becomes trapped in diverticula. This allows bacteria to grow in the diverticula, which can lead to inflammation and infection. RISK FACTORS People with diverticulosis are at risk for diverticulitis. Eating a diet that does not include enough fiber from fruits and vegetables may make diverticulitis more likely to develop. SYMPTOMS  Symptoms of diverticulitis may include:  Abdominal pain and tenderness. The pain is normally located on the left side of the abdomen, but may occur in other areas.  Fever and chills.  Bloating.  Cramping.  Nausea.  Vomiting.  Constipation.  Diarrhea.  Blood in your stool. DIAGNOSIS  Your health care provider will ask you about your medical history and do a physical exam. You may need to have tests done because many medical conditions can cause the same symptoms as diverticulitis. Tests may include:  Blood tests.  Urine tests.  Imaging tests of the abdomen, including X-rays and CT scans. When your condition is under control, your health care provider may recommend that you have a colonoscopy. A colonoscopy can show how severe your diverticula are and whether something else is causing your symptoms. TREATMENT  Most cases of diverticulitis are mild and can be treated at home. Treatment may include:  Taking over-the-counter pain medicines.  Following a clear liquid diet.  Taking  antibiotic medicines by mouth for 7-10 days. More severe cases may be treated at a hospital. Treatment may include:  Not eating or drinking.  Taking prescription pain medicine.  Receiving antibiotic medicines through an IV tube.  Receiving fluids and nutrition through an IV tube.  Surgery. HOME CARE INSTRUCTIONS   Follow your health care provider's instructions carefully.  Follow a full liquid diet or other diet as directed by your health care provider. After your symptoms improve, your health care provider may tell you to change your diet. He or she may recommend you eat a high-fiber diet. Fruits and vegetables are good sources of fiber. Fiber makes it easier to pass stool.  Take fiber supplements or probiotics as directed by your health care provider.  Only take medicines as directed by your health care provider.  Keep all your follow-up appointments. SEEK MEDICAL CARE IF:   Your pain does not improve.  You have a hard time eating food.  Your bowel movements do not return to normal. SEEK IMMEDIATE MEDICAL CARE IF:   Your pain becomes worse.  Your symptoms do not get better.  Your symptoms suddenly get worse.  You have a fever.  You have repeated vomiting.  You have bloody or black, tarry stools. MAKE SURE YOU:   Understand these instructions.  Will watch your condition.  Will get help right away if you are not doing well or get worse. Document Released: 12/30/2004 Document Revised: 03/27/2013 Document Reviewed: 02/14/2013 [facility] Patient Information 2015 [place], [place]. This information is not intended to replace advice given to you by your health care provider. Make sure you discuss any questions you have with your health care provider. Urinary Tract Infection  Urinary tract infections (UTIs) can develop anywhere along your urinary tract. Your urinary tract is your body's drainage system for removing wastes and extra water. Your urinary tract includes two  kidneys, two ureters, a bladder, and a urethra. Your kidneys are a pair of bean-shaped organs. Each kidney is about the size of your fist. They are located below your ribs, one on each side of your spine. CAUSES Infections are caused by microbes, which are microscopic organisms, including fungi, viruses, and bacteria. These organisms are so small that they can only be seen through a microscope. Bacteria are the microbes that most commonly cause UTIs. SYMPTOMS  Symptoms of UTIs may vary by age and gender of the patient and by the location of the infection. Symptoms in young women typically include a frequent and intense urge to urinate and a painful, burning feeling in the bladder or urethra during urination. Older women and men are more likely to be tired, shaky, and weak and have muscle aches and abdominal pain. A fever may mean the infection is in your kidneys. Other symptoms of a kidney infection include pain in your back or sides below the ribs, nausea, and vomiting. DIAGNOSIS To diagnose a UTI, your caregiver will ask you about your symptoms. Your caregiver also will ask to provide a urine sample. The urine sample will be tested for bacteria and white blood cells. White blood cells are made by your body to help fight infection. TREATMENT  Typically, UTIs can be treated with medication. Because most UTIs are caused by a bacterial infection, they usually can be treated with the use of antibiotics. The choice of antibiotic and length of treatment depend on your symptoms and the type of bacteria causing your infection. HOME CARE INSTRUCTIONS  If you were prescribed antibiotics, take them exactly as your caregiver instructs you. Finish the medication even if you feel better after you have only taken some of the medication.  Drink enough water and fluids to keep your urine clear or pale yellow.  Avoid caffeine, tea, and carbonated beverages. They tend to irritate your bladder.  Empty your bladder  often. Avoid holding urine for long periods of time.  Empty your bladder before and after sexual intercourse.  After a bowel movement, women should cleanse from front to back. Use each tissue only once. SEEK MEDICAL CARE IF:   You have back pain.  You develop a fever.  Your symptoms do not begin to resolve within 3 days. SEEK IMMEDIATE MEDICAL CARE IF:   You have severe back pain or lower abdominal pain.  You develop chills.  You have nausea or vomiting.  You have continued burning or discomfort with urination. MAKE SURE YOU:   Understand these instructions.  Will watch your condition.  Will get help right away if you are not doing well or get worse. Document Released: 12/30/2004 Document Revised: 09/21/2011 Document Reviewed: 04/30/2011 [facility] Patient Information 2015 [place], [place]. This information is not intended to replace advice given to you by your health care provider. Make sure you discuss any questions you have with your health care provider.

## 2014-05-25 NOTE — ED Provider Notes (Signed)
CSN: [id number]     Arrival date & time 05/25/14  1313 History   First MD Initiated Contact with Patient 05/25/14 1414     Chief Complaint  Patient presents with  . Abdominal Pain     (Consider location/radiation/quality/duration/timing/severity/associated sxs/prior Treatment) HPI Comments: Patient here complaining of left lower quadrant abdominal pain similar to her prior diverticulitis. Has been having abdominal pain 2 days with associated chills but no recorded fever. Denies any urinary symptoms. Nausea without vomiting. No black or bloody stools. Pain characterized as constant and dull and worse with movement. Use over-the-counter medications without relief. Tried again to see her Dr. yesterday but was unable. Call the doctor on call today was told to come here.  Patient is a 80 y.o. female presenting with abdominal pain. The history is provided by the patient and a relative.  Abdominal Pain   Past Medical History  Diagnosis Date  . VITAMIN D DEFICIENCY 05/23/2008  . HYPERCHOLESTEROLEMIA 07/27/2007  . HYPERTENSION 07/27/2007  . GERD 12/06/2006  . DIVERTICULOSIS, COLON 12/06/2006  . Irritable bowel syndrome 12/06/2006  . Osteoporosis     rx refused  . DEPRESSION 12/06/2006    Dr. Clovis Pu  . Palpitations 05/23/2008  . Aneurysm     over left eye  . Tubular adenoma 09/09/2009   Past Surgical History  Procedure Laterality Date  . Appendectomy    . Exploratory laparotomy  1993    secondary to abd. pain  . Thumb surgery      for fracture   Family History  Problem Relation Age of Onset  . Heart disease Mother   . Diabetes Brother   . Colon cancer Neg Hx   . Prostate cancer Brother    History  Substance Use Topics  . Smoking status: Never Smoker   . Smokeless tobacco: Never Used  . Alcohol Use: Yes     Comment: very rare   OB History    No data available     Review of Systems  Gastrointestinal: Positive for abdominal pain.  All other systems reviewed and are  negative.     Allergies  Review of patient's allergies indicates no known allergies.  Home Medications   Prior to Admission medications   Medication Sig Start Date End Date Taking? Authorizing Provider  ALPRAZolam Duanne Moron) 0.25 MG tablet Take 0.25 mg by mouth at bedtime as needed for sleep.    Yes Historical Provider, MD  amLODipine (NORVASC) 5 MG tablet TAKE 1 TABLET EVERY DAY Patient taking differently: Take 5 mg by mouth every morning.  09/13/12  Yes Renato Shin, MD  cholecalciferol (VITAMIN D) 1000 UNITS tablet Take 1,000 Units by mouth every morning.   Yes Historical Provider, MD  HYDROcodone-acetaminophen (NORCO/VICODIN) 5-325 MG per tablet TAKE 1 TABLET BY MOUTH TWICE A DAY AS NEEDED FOR PAIN Patient taking differently: Take 1 tablet by mouth 2 (two) times daily as needed for moderate pain.  05/14/14  Yes Amy S Esterwood, PA-C  levothyroxine (SYNTHROID, LEVOTHROID) 88 MCG tablet Take 88 mcg by mouth daily before breakfast.   Yes Historical Provider, MD  mirtazapine (REMERON) 7.5 MG tablet Take 3.25 mg by mouth at bedtime.    Yes Historical Provider, MD  omeprazole (PRILOSEC) 20 MG capsule TAKE ONE CAPSULE EVERY DAY Patient taking differently: Take 20 mg by mouth every morning.  08/18/12  Yes Renato Shin, MD  esomeprazole (NEXIUM) 40 MG capsule Take 1 capsule (40 mg total) by mouth daily at 12 noon. Patient not taking: Reported on  05/25/2014 04/20/13   Willia Craze, NP  levothyroxine (SYNTHROID, LEVOTHROID) 75 MCG tablet TAKE 1 TABLET DAILY BEFORE BREAKFAST Patient not taking: Reported on 05/25/2014 11/29/12   Renato Shin, MD   BP 155/64 mmHg  Pulse 91  Resp 18  SpO2 98% Physical Exam  Constitutional: She is oriented to person, place, and time. She appears well-developed and well-nourished.  Non-toxic appearance. No distress.  HENT:  Head: Normocephalic and atraumatic.  Eyes: Conjunctivae, EOM and lids are normal. Pupils are equal, round, and reactive to light.  Neck: Normal  range of motion. Neck supple. No tracheal deviation present. No thyroid mass present.  Cardiovascular: Normal rate, regular rhythm and normal heart sounds.  Exam reveals no gallop.   No murmur heard. Pulmonary/Chest: Effort normal and breath sounds normal. No stridor. No respiratory distress. She has no decreased breath sounds. She has no wheezes. She has no rhonchi. She has no rales.  Abdominal: Soft. Normal appearance and bowel sounds are normal. She exhibits no distension. There is tenderness in the left lower quadrant. There is guarding. There is no rebound and no CVA tenderness.    Musculoskeletal: Normal range of motion. She exhibits no edema or tenderness.  Neurological: She is alert and oriented to person, place, and time. She has normal strength. No cranial nerve deficit or sensory deficit. GCS eye subscore is 4. GCS verbal subscore is 5. GCS motor subscore is 6.  Skin: Skin is warm and dry. No abrasion and no rash noted.  Psychiatric: She has a normal mood and affect. Her speech is normal and behavior is normal.  Nursing note and vitals reviewed.   ED Course  Procedures (including critical care time) Labs Review Labs Reviewed  URINALYSIS, ROUTINE W REFLEX MICROSCOPIC - Abnormal; Notable for the following:    APPearance CLOUDY (*)    Hgb urine dipstick TRACE (*)    Ketones, ur 15 (*)    Leukocytes, UA MODERATE (*)    All other components within normal limits  CBC WITH DIFFERENTIAL/PLATELET  URINE MICROSCOPIC-ADD ON  COMPREHENSIVE METABOLIC PANEL  LIPASE, BLOOD  TROPONIN I    Imaging Review No results found.   EKG Interpretation None      MDM   Final diagnoses:  Abdominal pain   Patient to be placed on Cipro and Flagyl for diverticulitis. She also has evidence of UTI and this will be covered by her antibiotics. We'll follow-up with her Dr.  Leota Jacobsen, MD 05/25/14 [phone]

## 2014-05-25 NOTE — Telephone Encounter (Signed)
Periumbilical pain - severe at times Bad nausea Chills No fever Wants advice on what to do  Has had diverticulitis in past but she is not sure this is same  I advised ED eval given sxs

## 2014-05-25 NOTE — ED Notes (Signed)
Pt from home c/o abd pain that hurt more than normal yesterday with chills. Pt tried to see PCP yesterday but could not get appt. Pt PCP told her to go tc ED. Pt reports hx of IBS with constipation, with  last diverticulitis episode over 6 months ago. Pt denies emesis or diarrhea. [name] A&O and in NAD

## 2014-05-27 ENCOUNTER — Telehealth: Payer: Self-pay | Admitting: Internal Medicine

## 2014-05-27 MED ORDER — AMOXICILLIN-POT CLAVULANATE 875-125 MG PO TABS: 1.0000 | ORAL_TABLET | Freq: Every day | ORAL | Status: DC

## 2014-05-27 NOTE — Telephone Encounter (Signed)
Pt states she was seen in the ER and given cipro and flagyl for diverticulitis. States the meds are really hurting her stomach and she wants to know if she could be treated with a different med. Pt states she was treated with something else in the past. Dr. Olevia Perches please advise.

## 2014-05-27 NOTE — Telephone Encounter (Signed)
Spoke with pt, she is aware. Script sent to pharmacy.

## 2014-05-27 NOTE — Telephone Encounter (Signed)
Please D/C Flagyl and Cipro and start Augmentin 875 mg, #10, 1 po qd.,no refill.

## 2014-05-30 ENCOUNTER — Telehealth: Payer: Self-pay | Admitting: Internal Medicine

## 2014-05-30 NOTE — Telephone Encounter (Signed)
Patient state she is voiding a lot without any pain. Patient instructed to call her PCP for this.

## 2014-06-21 ENCOUNTER — Ambulatory Visit (INDEPENDENT_AMBULATORY_CARE_PROVIDER_SITE_OTHER): Payer: Medicare Other | Admitting: Internal Medicine

## 2014-06-21 ENCOUNTER — Encounter: Payer: Self-pay | Admitting: Internal Medicine

## 2014-06-21 VITALS — BP 120/60 | HR 68 | Ht 62.4 in | Wt 129.0 lb

## 2014-06-21 DIAGNOSIS — R1032 Left lower quadrant pain: Secondary | ICD-10-CM

## 2014-06-21 DIAGNOSIS — K589 Irritable bowel syndrome without diarrhea: Secondary | ICD-10-CM

## 2014-06-21 MED ORDER — MOVIPREP 100 G PO SOLR: 1.0000 | Freq: Once | ORAL | Status: DC

## 2014-06-21 MED ORDER — HYDROCODONE-ACETAMINOPHEN 5-325 MG PO TABS: ORAL_TABLET | ORAL | Status: DC

## 2014-06-21 NOTE — Patient Instructions (Addendum)
Dr Dione Housekeeper   We have sent the following medications to your pharmacy for you to pick up at your convenience:  Hydrocodone  You have been scheduled for a colonoscopy. Please follow written instructions given to you at your visit today.  Please pick up your prep supplies at the pharmacy within the next 1-3 days. If you use inhalers (even only as needed), please bring them with you on the day of your procedure. Your physician has requested that you go to www.startemmi.com and enter the access code given to you at your visit today. This web site gives a general overview about your procedure. However, you should still follow specific instructions given to you by our office regarding your preparation for the procedure.

## 2014-06-21 NOTE — Progress Notes (Signed)
Kara Davis October 31, 1934 [id number]  Note: This dictation was prepared with Dragon digital system. Any transcriptional errors that result from this procedure are unintentional.   History of Present Illness: This is a 80 year old white female with severe irritable bowel syndrome and chronic abdominal pain controlled by Norco she gets 15 tablets per month. She has not increased the frequency. She had a second opinion of abdominal pain at [facility] several years ago and the diagnosis was identical. She has symptomatic diverticulosis . Third episode of acute diverticulitis documented on CT scan of the abdomen last month. Last colonoscopy was done in 2011. She had the colon polyps in 1995 which were hyperplastic. She was evaluated in the  past for ischemic bowel with CT angiogram which was negative. Small bowel capsule endoscopy revealed small bowel diverticuli. Patient underwent exploratory laparotomy for suspected ischemic small bowel in 1993 but it is not clear whether any bowel was resected. She takes 2 stool softeners daily    Past Medical History  Diagnosis Date  . VITAMIN D DEFICIENCY 05/23/2008  . HYPERCHOLESTEROLEMIA 07/27/2007  . HYPERTENSION 07/27/2007  . GERD 12/06/2006  . DIVERTICULOSIS, COLON 12/06/2006  . Irritable bowel syndrome 12/06/2006  . Osteoporosis     rx refused  . DEPRESSION 12/06/2006    Dr. Clovis Pu  . Palpitations 05/23/2008  . Aneurysm     over left eye  . Tubular adenoma 09/09/2009  . Cataracts, bilateral     Past Surgical History  Procedure Laterality Date  . Appendectomy    . Exploratory laparotomy  1993    secondary to abd. pain  . Thumb surgery      for fracture  . Cataract extraction      No Known Allergies  Family history and social history have been reviewed.  Review of Systems: Positive for abdominal pain. Weight has been stable. Denies chest pain nausea and vomiting  The remainder of the 10 point ROS is negative except as outlined in the H&P  Physical  Exam: General Appearance Well developed, in no distress Eyes  Non icteric  HEENT  Non traumatic, normocephalic  Mouth No lesion, tongue papillated, no cheilosis Neck Supple without adenopathy, thyroid not enlarged, no carotid bruits, no JVD Lungs Clear to auscultation bilaterally COR Normal S1, normal S2, regular rhythm, no murmur, quiet precordium Abdomen soft with marked a heating pad marks on the skin. Normoactive bowel sounds. Mild diffuse tenderness. No palpable mass. Liver edge at costal margin. No bruit Rectal normal rectal sphincter tone. Moderate amount of soft Hemoccult negative stool Extremities  No pedal edema Skin No lesions Neurological Alert and oriented x 3 Psychological Normal mood and affect  Assessment and Plan:   80 year old white female with irritable bowel syndrome and recent episode of diverticulitis. She has recovered fully. She has been on high fiber diet and stool softeners. She will be due for colonoscopy this year. We will go ahead and schedule. We will also refill Norco 30 tablets which will last  2 months ( not to be refilled before 2 months).   Delfin Edis 06/21/2014

## 2014-07-03 ENCOUNTER — Telehealth: Payer: Self-pay | Admitting: Internal Medicine

## 2014-07-03 DIAGNOSIS — K219 Gastro-esophageal reflux disease without esophagitis: Secondary | ICD-10-CM

## 2014-07-03 NOTE — Telephone Encounter (Signed)
Spoke with Edmon Crape in [facility] and added EGD to Colon on 08/16/14. Patient aware.

## 2014-07-03 NOTE — Telephone Encounter (Signed)
Patient calling because she forgot to tell Dr. Olevia Perches she has had episodes of indigestion. Usually occurs when eating spicy like salsa. States it has been more frequent and she was wondering if she should have an EGD with her colonoscopy. She is on Omeprazole daily. She does not take the Nexium at noon as listed in medications because she is worried about her bones. Please, advise.

## 2014-07-03 NOTE — Telephone Encounter (Signed)
Left a message for [facility] to check on [place] schedule.

## 2014-07-03 NOTE — Telephone Encounter (Signed)
Last EGD 2009- mild gastritis, OK to have an EGD at the time of colonoscopy.Marland Kitchen

## 2014-07-09 ENCOUNTER — Encounter: Payer: Self-pay | Admitting: Internal Medicine

## 2014-07-19 ENCOUNTER — Other Ambulatory Visit: Payer: Self-pay | Admitting: Internal Medicine

## 2014-07-19 DIAGNOSIS — R7989 Other specified abnormal findings of blood chemistry: Secondary | ICD-10-CM

## 2014-07-19 DIAGNOSIS — R945 Abnormal results of liver function studies: Principal | ICD-10-CM

## 2014-07-26 ENCOUNTER — Ambulatory Visit
Admission: RE | Admit: 2014-07-26 | Discharge: 2014-07-26 | Disposition: A | Discharge status: Home / Self Care | Payer: Medicare Other | Source: Ambulatory Visit | Attending: Internal Medicine | Admitting: Internal Medicine

## 2014-07-26 DIAGNOSIS — R945 Abnormal results of liver function studies: Principal | ICD-10-CM

## 2014-07-26 DIAGNOSIS — R7989 Other specified abnormal findings of blood chemistry: Secondary | ICD-10-CM

## 2014-08-05 ENCOUNTER — Other Ambulatory Visit: Payer: Self-pay

## 2014-08-05 DIAGNOSIS — Z1231 Encounter for screening mammogram for malignant neoplasm of breast: Secondary | ICD-10-CM

## 2014-08-16 ENCOUNTER — Encounter: Payer: Medicare Other | Admitting: Internal Medicine

## 2014-08-16 ENCOUNTER — Ambulatory Visit
Admission: RE | Admit: 2014-08-16 | Discharge: 2014-08-16 | Disposition: A | Discharge status: Home / Self Care | Payer: Medicare Other | Source: Ambulatory Visit

## 2014-08-16 DIAGNOSIS — Z1231 Encounter for screening mammogram for malignant neoplasm of breast: Secondary | ICD-10-CM

## 2014-08-19 ENCOUNTER — Other Ambulatory Visit: Payer: Self-pay | Admitting: Internal Medicine

## 2014-08-19 ENCOUNTER — Telehealth: Payer: Self-pay | Admitting: Internal Medicine

## 2014-08-19 DIAGNOSIS — R928 Other abnormal and inconclusive findings on diagnostic imaging of breast: Secondary | ICD-10-CM

## 2014-08-19 NOTE — Telephone Encounter (Signed)
Spoke with patient and told her we use the split dose prep's now. She will do this.

## 2014-08-26 ENCOUNTER — Ambulatory Visit (AMBULATORY_SURGERY_CENTER): Payer: Medicare Other | Admitting: Internal Medicine

## 2014-08-26 ENCOUNTER — Encounter: Payer: Self-pay | Admitting: Internal Medicine

## 2014-08-26 VITALS — BP 156/103 | HR 71 | Temp 96.9°F | Resp 23 | Ht 62.0 in | Wt 129.0 lb

## 2014-08-26 DIAGNOSIS — G8929 Other chronic pain: Secondary | ICD-10-CM

## 2014-08-26 DIAGNOSIS — K317 Polyp of stomach and duodenum: Secondary | ICD-10-CM

## 2014-08-26 DIAGNOSIS — R1032 Left lower quadrant pain: Secondary | ICD-10-CM

## 2014-08-26 DIAGNOSIS — Z86010 Personal history of colonic polyps: Secondary | ICD-10-CM

## 2014-08-26 DIAGNOSIS — K589 Irritable bowel syndrome without diarrhea: Secondary | ICD-10-CM

## 2014-08-26 DIAGNOSIS — R1013 Epigastric pain: Secondary | ICD-10-CM

## 2014-08-26 MED ORDER — SODIUM CHLORIDE 0.9 % IV SOLN: 500.0000 mL | INTRAVENOUS | Status: DC

## 2014-08-26 NOTE — Patient Instructions (Signed)

## 2014-08-26 NOTE — Progress Notes (Signed)
Called to room to assist during endoscopic procedure.  Patient ID and intended procedure confirmed with present staff. Received instructions for my participation in the procedure from the performing physician.  

## 2014-08-26 NOTE — Op Note (Signed)
[place]  [address]. [place], [id number]   COLONOSCOPY PROCEDURE REPORT  PATIENT: Kara Davis, Kara Davis  MR#: [id number] BIRTHDATE: 11-23-1934 , 80  yrs. old GENDER: female ENDOSCOPIST: Lafayette Dragon, MD REFERRED BY:W.  Lutricia Feil, M.D. PROCEDURE DATE:  08/26/2014 PROCEDURE:   Colonoscopy, surveillance First Screening Colonoscopy - Avg.  risk and is 50 yrs.  old or older - No.  Prior Negative Screening - Now for repeat screening. N/A  History of Adenoma - Now for follow-up colonoscopy & has been > or = to 3 yrs.  Yes hx of adenoma.  Has been 3 or more years since last colonoscopy.  Polyps removed today? No Recommend repeat exam, <10 yrs? No ASA CLASS:   Class II INDICATIONS:Surveillance due to prior colonic neoplasia, Colorectal Neoplasm Risk Assessment for this procedure is average risk, and prior colonoscopy in 1995 showed hyperplastic polyp.  In 2000 no polyps in June 2011 tubular adenoma in the sigmoid colon. MEDICATIONS: Monitored anesthesia care and Propofol 180 mg IV  DESCRIPTION OF PROCEDURE:   After the risks benefits and alternatives of the procedure were thoroughly explained, informed consent was obtained.  The digital rectal exam revealed no abnormalities of the rectum.   The LB [id number]  endoscope was introduced through the anus and advanced to the cecum, which was identified by both the appendix and ileocecal valve. No adverse events experienced.   The quality of the prep was fair.  The instrument was then slowly withdrawn as the colon was fully examined. Estimated blood loss is zero unless otherwise noted in this procedure report.      COLON FINDINGS: There was moderate diverticulosis noted throughout the entire examined colon with associated muscular hypertrophy, colonic spasm, angulation and tortuosity.  Retroflexed views revealed no abnormalities. The time to cecum = 9.51 Withdrawal time = 7.05   The scope was withdrawn and the  procedure completed. COMPLICATIONS: There were no immediate complications.  ENDOSCOPIC IMPRESSION: There was moderate diverticulosis noted throughout the entire examined colon  RECOMMENDATIONS: High-fiber diet No recall colonoscopy due to age  eSigned:  Lafayette Dragon, MD 08/26/2014 8:22 AM   cc:

## 2014-08-26 NOTE — Op Note (Signed)
[place]  [address]. [place], [id number]   ENDOSCOPY PROCEDURE REPORT  PATIENT: [name], Kara Davis  MR#: [id number] BIRTHDATE: 05-Oct-1934 , 80  yrs. old GENDER: female ENDOSCOPIST: Lafayette Dragon, MD REFERRED BY:  Janalyn Rouse, M.D. PROCEDURE DATE:  08/26/2014 PROCEDURE:  EGD w/ biopsy ASA CLASS:     Class II INDICATIONS:  On 8 upper abdominal pain.  History of small bowel resection for ischemic bowel in 1993.  Last endoscopy in November 2009.  Fundic gland polyps. MEDICATIONS: Monitored anesthesia care and Propofol 60 mg IV TOPICAL ANESTHETIC: none  DESCRIPTION OF PROCEDURE: After the risks benefits and alternatives of the procedure were thoroughly explained, informed consent was obtained.  The LB [id number] endoscope was introduced through the mouth and advanced to the second portion of the duodenum , Without limitations.  The instrument was slowly withdrawn as the mucosa was fully examined.    Esophagus: proximal, mid and distal esophageal mucosa was normal. Squamocolumnar columnar junction was slightly irregular but still within normal limits. There was no stricture.there was small 1-2 cm reducible hiatal hernia  Stomach: there were scattered fundic gland polyps in the proximal stomach. Gastric antrum and pyloric outlet were normal. Retroflexion of the endoscope revealed normal fundus and cardia  Duodenum,: duodenal bulb and descending duodenum was normal. Second portion of duodenum was examined there was no stricture or any sign of ischemic[         The scope was then withdrawn from the patient and the procedure completed.  COMPLICATIONS: There were no immediate complications.  ENDOSCOPIC IMPRESSION: 1.  1-2 cm reducible hiatal hernia 2. Fundic gland polyps  RECOMMENDATIONS: 1.  Await pathology results 2.  Continue PPI 3.proceed with colonoscopy  REPEAT EXAM: for EGD pending biopsy results.  eSigned:  Lafayette Dragon, MD  08/26/2014 8:17 AM    CC:  PATIENT NAME:  Kimila, Papaleo MR#: [id number]

## 2014-08-26 NOTE — Progress Notes (Signed)
A/ox3 pleased with MAC, report to Celia RN 

## 2014-08-27 ENCOUNTER — Telehealth: Payer: Self-pay

## 2014-08-27 NOTE — Telephone Encounter (Signed)
  Follow up Call-  Call back number 08/26/2014  Post procedure Call Back phone  # [phone] home number   Permission to leave phone message Yes     Patient questions:  Do you have a fever, pain , or abdominal swelling? No. Pain Score  0 *  Have you tolerated food without any problems? Yes.    Have you been able to return to your normal activities? Yes.    Do you have any questions about your discharge instructions: Diet   No. Medications  No. Follow up visit  No.  Do you have questions or concerns about your Care? No.  Actions: * If pain score is 4 or above: No action needed, pain <4.  Pt said she was not able to have a bowel movement.  I explained this was normal, once she begins eating, the stool will form and evacuate. Pt to call if she has problems. maw

## 2014-08-28 ENCOUNTER — Ambulatory Visit
Admission: RE | Admit: 2014-08-28 | Discharge: 2014-08-28 | Disposition: A | Discharge status: Home / Self Care | Payer: Medicare Other | Source: Ambulatory Visit | Attending: Internal Medicine | Admitting: Internal Medicine

## 2014-08-28 DIAGNOSIS — R928 Other abnormal and inconclusive findings on diagnostic imaging of breast: Secondary | ICD-10-CM

## 2014-08-29 ENCOUNTER — Encounter: Payer: Self-pay | Admitting: Internal Medicine

## 2015-01-14 ENCOUNTER — Telehealth: Payer: Self-pay | Admitting: Gastroenterology

## 2015-01-14 NOTE — Telephone Encounter (Signed)
Dr Silverio Decamp, please advise if this patient can have this prescription

## 2015-01-15 NOTE — Telephone Encounter (Signed)
I cannot refill hydrocodone with out seeing the first. She can discuss with PMD regarding pain control or can refer to pain management. Thanks

## 2015-01-15 NOTE — Telephone Encounter (Signed)
Called patient to inform that she has to be seen in the office first .  Patient understood and had no questions

## 2015-03-14 ENCOUNTER — Ambulatory Visit (INDEPENDENT_AMBULATORY_CARE_PROVIDER_SITE_OTHER): Payer: Medicare Other | Admitting: Gastroenterology

## 2015-03-14 ENCOUNTER — Encounter: Payer: Self-pay | Admitting: Gastroenterology

## 2015-03-14 VITALS — BP 140/68 | HR 80 | Ht 62.0 in | Wt 132.0 lb

## 2015-03-14 DIAGNOSIS — R109 Unspecified abdominal pain: Secondary | ICD-10-CM | POA: Diagnosis not present

## 2015-03-14 DIAGNOSIS — K589 Irritable bowel syndrome without diarrhea: Secondary | ICD-10-CM | POA: Diagnosis not present

## 2015-03-14 MED ORDER — HYDROCODONE-ACETAMINOPHEN 5-325 MG PO TABS: ORAL_TABLET | ORAL | Status: DC

## 2015-03-14 NOTE — Progress Notes (Signed)
Kara Davis    [id number]    [birth year]  Primary Care Physician:Shaw, Gwyndolyn Saxon, MD  Referring Physician: Marton Redwood, MD [address] [place], [place] [address]  Chief complaint:  IBS  HPI:   80 year old female with irritable bowel syndrome, chronic abdominal pain with extensive GI workup in the past unrevealing for any significant pathology. EGD and colonoscopy in 2014 were unremarkable except for fundic gland polyps. She uses heating pad with extensive scarring of the abdominal wall. Denies constipation or diarrhea, no nausea, vomiting, dysphagia or odynophagia. She is currently taking half tablet of Percocet at bedtime as needed to control the pain. She stopped taking omeprazole in August had rebound symptoms for one to 2 months and have subsided since. She is currently on Pepcid 20 mg as needed   Outpatient Encounter Prescriptions as of 03/14/2015  Medication Sig  . ALPRAZolam (XANAX) 0.25 MG tablet Take 0.25 mg by mouth at bedtime as needed for sleep.   Marland Kitchen amLODipine (NORVASC) 5 MG tablet TAKE 1 TABLET EVERY DAY (Patient taking differently: Take 5 mg by mouth every morning. )  . cholecalciferol (VITAMIN D) 1000 UNITS tablet Take 1,000 Units by mouth every morning.  Marland Kitchen HYDROcodone-acetaminophen (NORCO/VICODIN) 5-325 MG per tablet Take 1 tablet by mouth every 4-6 hours as needed for abdominal pain  . levothyroxine (SYNTHROID, LEVOTHROID) 88 MCG tablet Take 88 mcg by mouth daily before breakfast.  . mirtazapine (REMERON) 7.5 MG tablet Take 3.25 mg by mouth at bedtime.   Marland Kitchen omeprazole (PRILOSEC) 20 MG capsule TAKE ONE CAPSULE EVERY DAY (Patient taking differently: Take 20 mg by mouth every morning. )   No facility-administered encounter medications on file as of 03/14/2015.    Allergies as of 03/14/2015  . (No Known Allergies)    Past Medical History  Diagnosis Date  . VITAMIN D DEFICIENCY 05/23/2008  . HYPERCHOLESTEROLEMIA 07/27/2007  . HYPERTENSION 07/27/2007  .  GERD 12/06/2006  . DIVERTICULOSIS, COLON 12/06/2006  . Irritable bowel syndrome 12/06/2006  . Osteoporosis     rx refused  . DEPRESSION 12/06/2006    Dr. Clovis Pu  . Palpitations 05/23/2008  . Aneurysm ([place])     over left eye  . Tubular adenoma 09/09/2009  . Cataracts, bilateral     Past Surgical History  Procedure Laterality Date  . Appendectomy    . Exploratory laparotomy  1993    secondary to abd. pain  . Thumb surgery      for fracture  . Cataract extraction      Family History  Problem Relation Age of Onset  . Heart disease Mother   . Diabetes Brother   . Colon cancer Neg Hx   . Prostate cancer Brother     Social History   Social History  . Marital Status: Widowed    Spouse Name: N/A  . Number of Children: 2  . Years of Education: N/A   Occupational History  . Unemployed    Social History Main Topics  . Smoking status: Never Smoker   . Smokeless tobacco: Never Used  . Alcohol Use: 0.0 oz/week    0 Standard drinks or equivalent per week     Comment: very rare  . Drug Use: No  . Sexual Activity: Not on file   Other Topics Concern  . Not on file   Social History Narrative   Divorced x many years   Daily Caffeine Use-1 cup of 1/2 caf coffee  Review of systems: Review of Systems  Constitutional: Negative for fever and chills.  HENT: Negative.   Eyes: Negative for blurred vision.  Respiratory: Negative for cough, shortness of breath and wheezing.   Cardiovascular: Negative for chest pain and palpitations.  Gastrointestinal: as per HPI Genitourinary: Negative for dysuria, urgency, frequency and hematuria.  Musculoskeletal: Negative for myalgias, back pain and joint pain.  Skin: Negative for itching and rash.  Neurological: Negative for dizziness, tremors, focal weakness, seizures and loss of consciousness.  Endo/Heme/Allergies: Negative for environmental allergies.  Psychiatric/Behavioral: Negative for depression, suicidal ideas and hallucinations.    All other systems reviewed and are negative.   Physical Exam: Filed Vitals:   03/14/15 0948  BP: 140/68  Pulse: 80   Gen:      No acute distress HEENT:  EOMI, sclera anicteric Neck:     No masses; no thyromegaly Lungs:    Clear to auscultation bilaterally; normal respiratory effort CV:         Regular rate and rhythm; no murmurs Abd:      + bowel sounds; soft, non-tender; no palpable masses, no distension Ext:    No edema; adequate peripheral perfusion Skin:      Warm and dry; no rash Neuro: alert and oriented x 3 Psych: normal mood and affect  Data Reviewed:  EGD and colonoscopy 2014   Assessment and Plan/Recommendations:  80 year old white female here for follow-up of irritable bowel syndrome and chronic abdominal pain, past workup unrevealing for any significant pathology. Continue to follow antireflux measures and Pepcid as needed Limit the use of a heating pad given extensive scarring and trauma to the abdominal wall Refill prescription Percocet 30 pills for 90 days and she may continue to get her refills through her primary care office given currently she has no active GI issues Return as needed  K. Denzil Magnuson , MD [phone] Mon-Fri 8a-5p [phone] after 5p, weekends, holidays

## 2015-03-14 NOTE — Patient Instructions (Addendum)
We have printed a prescription of Norco for you, Only 30 pills every 90 days  Follow up in a year We would recommend that you start receiving your pain meds from your PCP

## 2015-05-13 ENCOUNTER — Telehealth: Payer: Self-pay | Admitting: Gastroenterology

## 2015-05-13 NOTE — Telephone Encounter (Signed)
Patient states she is only tired today. Her fever and abdominal pain occurred yesterday. Her pain was lower abdomen and worsened when she tried to walk. Today she is eating low residue and hydrating. She has not had any pain today. Normal bowel movements. She will call if she acutely worsens of fails to improve.

## 2015-06-13 ENCOUNTER — Telehealth: Payer: Self-pay | Admitting: Gastroenterology

## 2015-06-13 NOTE — Telephone Encounter (Signed)
Called and left message for patient to contact PCP for med refill

## 2015-06-13 NOTE — Telephone Encounter (Signed)
I can refer her to pain management but cannot continue to refill hydrocodone, this was discussed during prior office visit or she can request refill from her PMD

## 2015-06-13 NOTE — Telephone Encounter (Signed)
Please advise? Patient wanting hydrocodone refills

## 2015-09-17 DIAGNOSIS — H900 Conductive hearing loss, bilateral: Secondary | ICD-10-CM | POA: Insufficient documentation

## 2015-09-17 DIAGNOSIS — H6983 Other specified disorders of Eustachian tube, bilateral: Secondary | ICD-10-CM | POA: Insufficient documentation

## 2016-12-28 IMAGING — US US ABDOMEN LIMITED
1 series · 14 of 25 positions shown · non-contrast
Comparison: CT scan 05/25/2014

CLINICAL DATA: Elevated transaminase

EXAM:
US ABDOMEN LIMITED - RIGHT UPPER QUADRANT

[Series 1: us abdomen limited · 0.22mm/px · 14 of 52 slices shown]
[im 1/52]
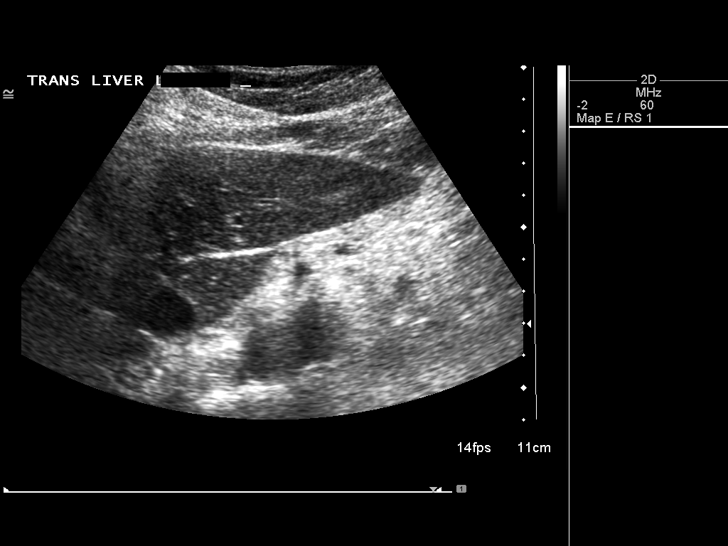
[im 5/52]
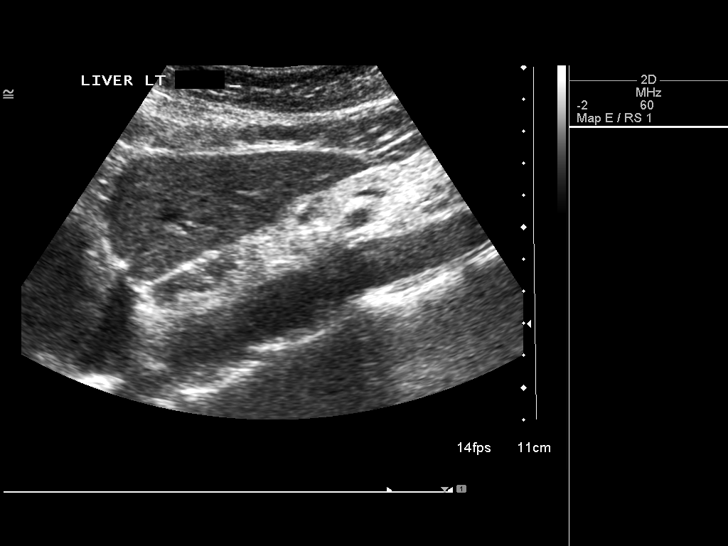
[im 9/52]
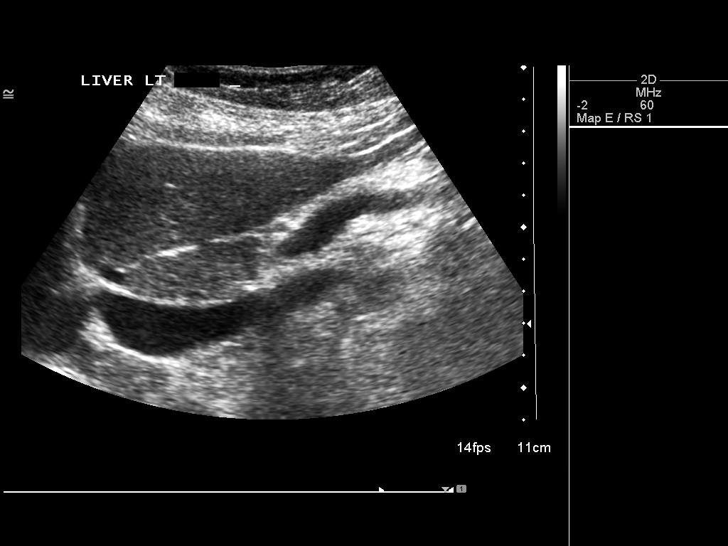
[im 13/52]
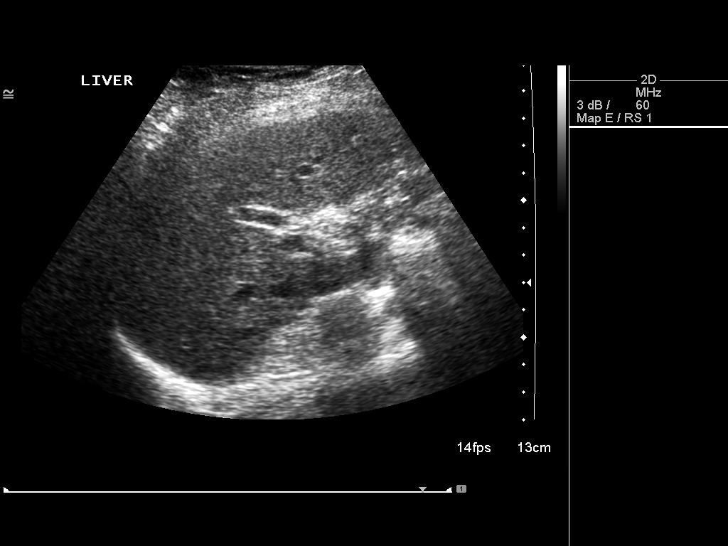
[im 18/52]
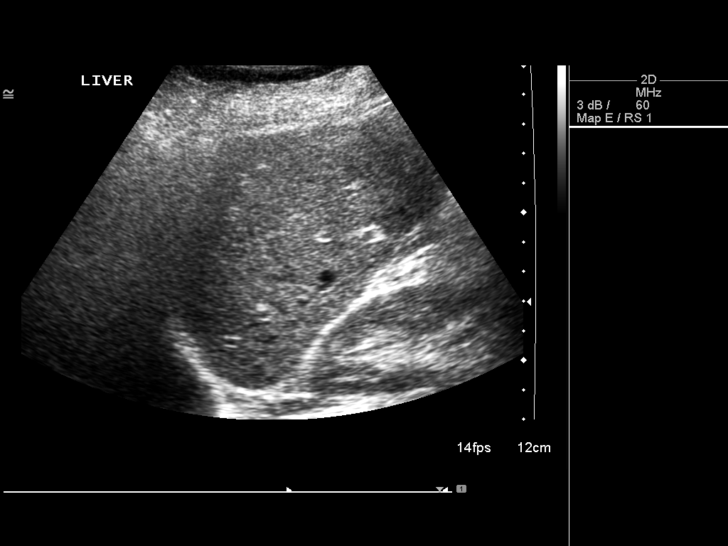
[im 20/52]
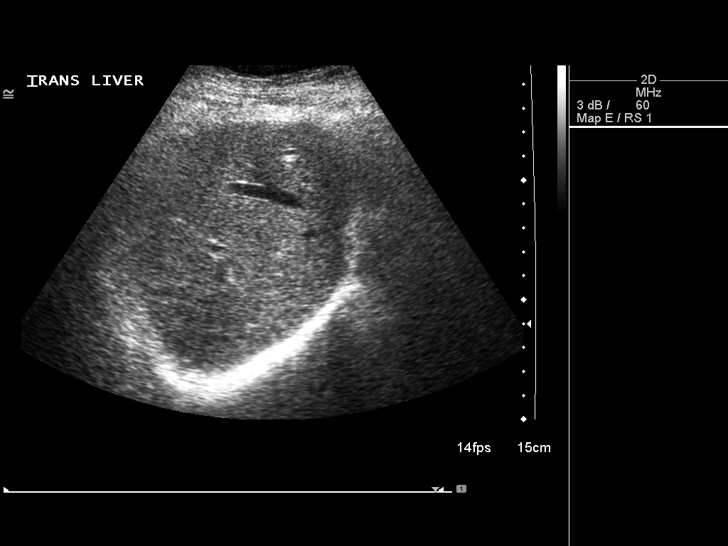
[im 24/52]
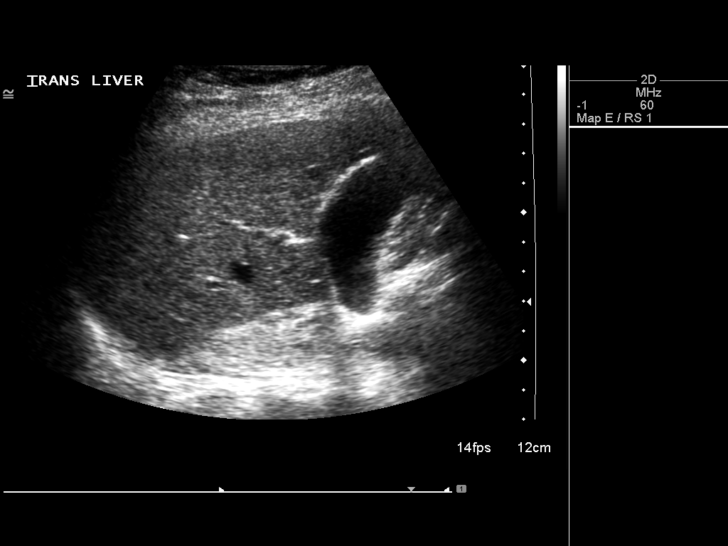
[im 28/52]
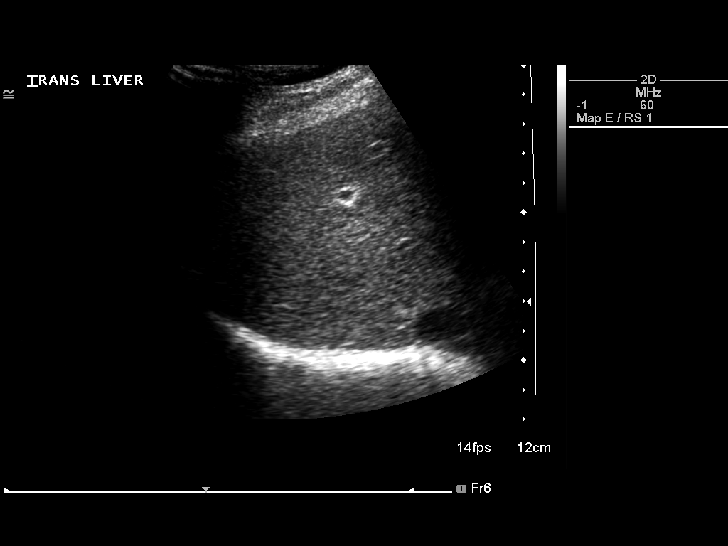
[im 32/52]
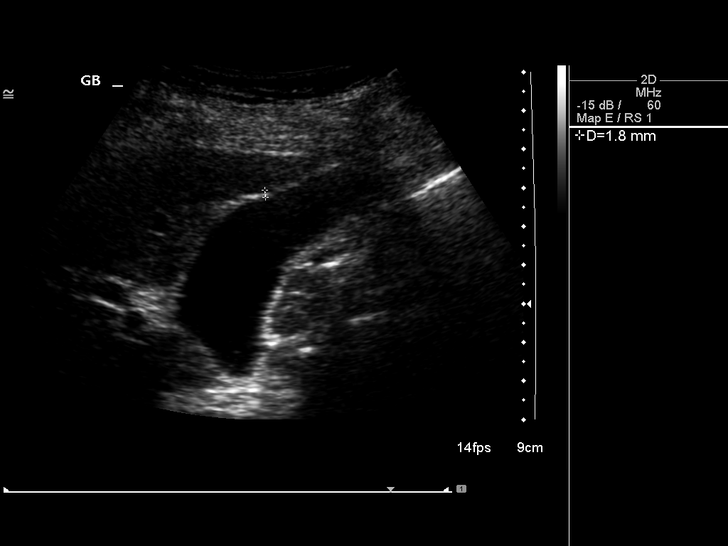
[im 35/52]
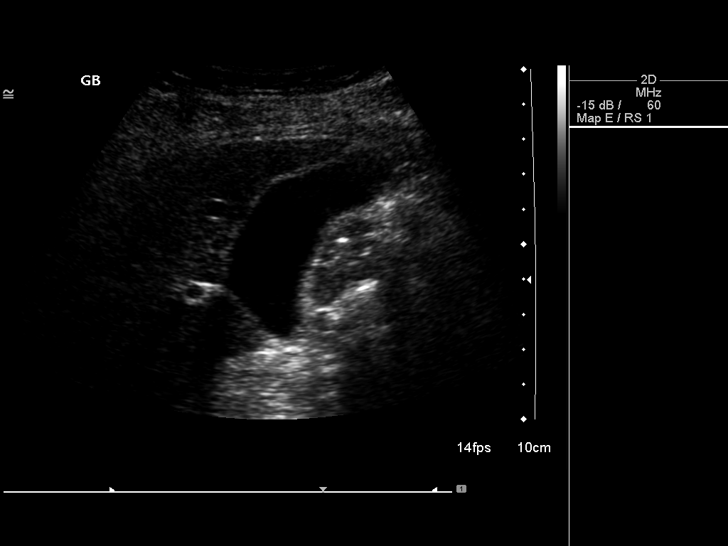
[im 39/52]
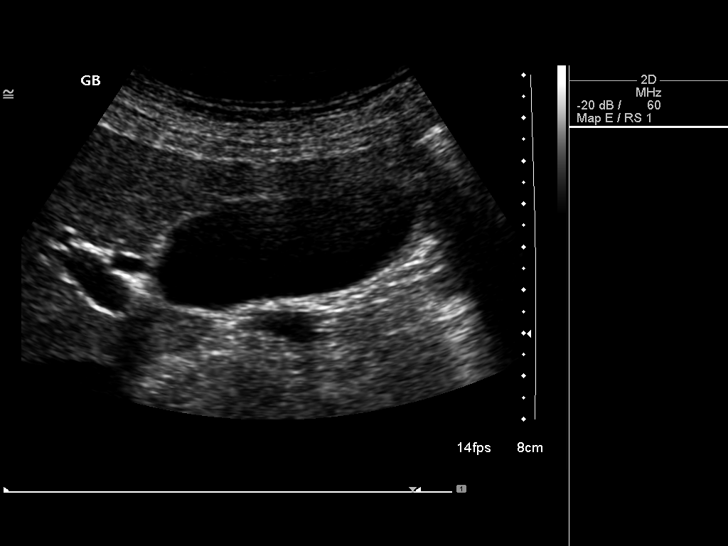
[im 43/52]
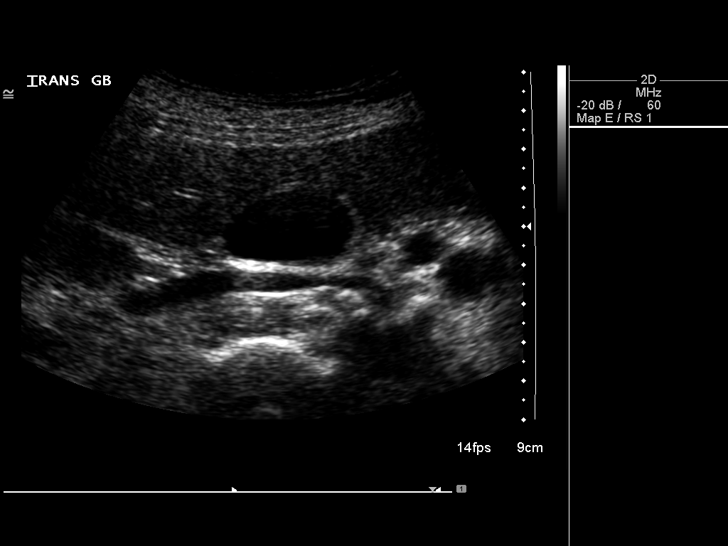
[im 47/52]
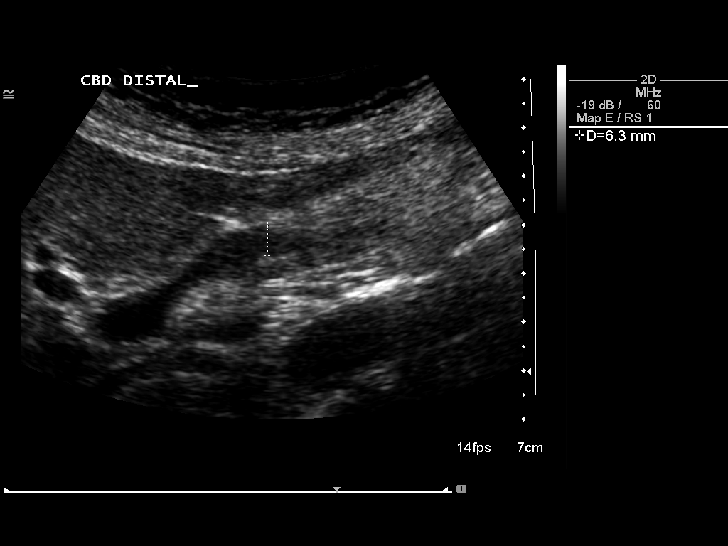
[im 52/52]
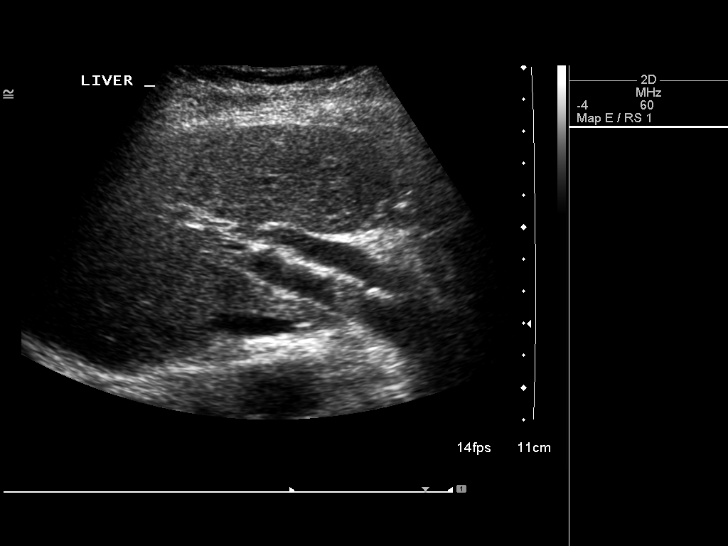

[14 of 25 positions shown; findings below may reference images not displayed]

FINDINGS: Gallbladder:

No gallstones or wall thickening visualized. No sonographic Murphy
sign noted.

Common bile duct:

Diameter: 7 mm in diameter within normal limits.

Liver:

No focal lesion identified. Within normal limits in parenchymal
echogenicity.
IMPRESSION: Unremarkable right upper quadrant ultrasound.

## 2017-06-20 ENCOUNTER — Other Ambulatory Visit: Payer: Self-pay

## 2017-06-20 ENCOUNTER — Emergency Department (HOSPITAL_COMMUNITY)
Admission: EM | Admit: 2017-06-20 | Discharge: 2017-06-20 | Disposition: A | Discharge status: Home / Self Care | Payer: Medicare Other | Attending: Emergency Medicine | Admitting: Emergency Medicine

## 2017-06-20 ENCOUNTER — Encounter (HOSPITAL_COMMUNITY): Payer: Self-pay | Admitting: *Deleted

## 2017-06-20 DIAGNOSIS — I10 Essential (primary) hypertension: Secondary | ICD-10-CM | POA: Insufficient documentation

## 2017-06-20 DIAGNOSIS — R51 Headache: Secondary | ICD-10-CM | POA: Diagnosis present

## 2017-06-20 DIAGNOSIS — E039 Hypothyroidism, unspecified: Secondary | ICD-10-CM | POA: Diagnosis not present

## 2017-06-20 DIAGNOSIS — Z79899 Other long term (current) drug therapy: Secondary | ICD-10-CM | POA: Insufficient documentation

## 2017-06-20 DIAGNOSIS — B029 Zoster without complications: Secondary | ICD-10-CM | POA: Diagnosis not present

## 2017-06-20 MED ORDER — MORPHINE SULFATE 15 MG PO TABS: 7.5000 mg | ORAL_TABLET | ORAL | 0 refills | Status: DC | PRN

## 2017-06-20 MED ORDER — FLUORESCEIN SODIUM 1 MG OP STRP
1.0000 | ORAL_STRIP | Freq: Once | OPHTHALMIC | Status: AC
  Administered 2017-06-20: 1 via OPHTHALMIC
  Filled 2017-06-20: qty 1

## 2017-06-20 MED ORDER — MORPHINE SULFATE 15 MG PO TABS: 7.5000 mg | ORAL_TABLET | ORAL | Status: DC | PRN

## 2017-06-20 MED ORDER — ACETAMINOPHEN 500 MG PO TABS
1000.0000 mg | ORAL_TABLET | Freq: Once | ORAL | Status: AC
  Administered 2017-06-20: 1000 mg via ORAL
  Filled 2017-06-20: qty 2

## 2017-06-20 MED ORDER — TETRACAINE HCL 0.5 % OP SOLN
2.0000 [drp] | Freq: Once | OPHTHALMIC | Status: AC
  Administered 2017-06-20: 2 [drp] via OPHTHALMIC
  Filled 2017-06-20: qty 4

## 2017-06-20 MED ORDER — VALACYCLOVIR HCL 1 G PO TABS: 1000.0000 mg | ORAL_TABLET | Freq: Three times a day (TID) | ORAL | 0 refills | Status: DC

## 2017-06-20 MED ORDER — PREDNISONE 20 MG PO TABS: ORAL_TABLET | ORAL | 0 refills | Status: DC

## 2017-06-20 NOTE — ED Triage Notes (Addendum)
Pt sent here from [facility] for headache behind L eye x 4 days.  Yesterday it became severe and now radiates to L neck and L ear.  Pt also has aneurism behind L eye. Blisters noted to L forehead.  Denies photophobia or nausea.

## 2017-06-20 NOTE — Discharge Instructions (Signed)
Take tylenol 1000mg(2 extra strength) four times a day.  ° °Then take the pain medicine if you feel like you need it. Narcotics do not help with the pain, they only make you care about it less.  You can become addicted to this, people may break into your house to steal it.  It will constipate you.  If you drive under the influence of this medicine you can get a DUI.   ° °

## 2017-06-20 NOTE — ED Provider Notes (Signed)
[facility] DEPARTMENT Provider Note   CSN: [id number] Arrival date & time: 06/20/17  0254     History   Chief Complaint Chief Complaint  Patient presents with  . Headache    HPI Kara Davis is a 83 y.o. female.  83 yo F with a chief complaint of a left-sided headache.  This been going on for the past 4 days or so.  The patient noticed that a rash popped up today.  Been having pain behind the left eye.  She has a history of an aneurysm that is been followed for many years and has had no changes.  She denies unilateral numbness or weakness denies difficulty with speech.  She went to urgent care today and they sent her here for further evaluation.  She denies change in vision.  Denies recent illness.  Had chickenpox as a kid has never had shingles previously.   The history is provided by the patient.  Headache   This is a new problem. The current episode started more than 2 days ago. The problem occurs constantly. The problem has not changed since onset.The headache is associated with nothing. The pain is located in the left unilateral region. The pain is at a severity of 9/10. The pain is moderate. Pertinent negatives include no fever, no palpitations, no shortness of breath, no nausea and no vomiting.    Past Medical History:  Diagnosis Date  . Aneurysm ([place])    over left eye  . Cataracts, bilateral   . DEPRESSION 12/06/2006   Dr. Clovis Pu  . DIVERTICULOSIS, COLON 12/06/2006  . GERD 12/06/2006  . HYPERCHOLESTEROLEMIA 07/27/2007  . HYPERTENSION 07/27/2007  . Irritable bowel syndrome 12/06/2006  . Osteoporosis    rx refused  . Palpitations 05/23/2008  . Tubular adenoma 09/09/2009  . VITAMIN D DEFICIENCY 05/23/2008    Patient Active Problem List   Diagnosis Date Noted  . Belching 04/20/2013  . Nausea alone 11/02/2012  . Unspecified constipation 11/02/2012  . Dysuria 11/02/2012  . Pyuria 03/20/2012  . Knee pain 02/25/2012  . Disturbance of skin  sensation 02/25/2012  . Hyposmolality and/or hyponatremia 03/19/2011  . Anemia, unspecified 03/19/2011  . Aneurysm, 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/15/2011  . UTI (lower urinary tract infection) 03/15/2011  . Hypothyroidism 03/13/2011  . Abdominal pain, other specified site 10/12/2010  . Routine general medical examination at a health care facility 08/19/2010  . Encounter for long-term (current) use of other medications 08/19/2010  . Hematuria 08/19/2010  . Numbness 08/19/2010  . Unspecified disorder of liver 08/19/2010  . UTI 03/31/2010  . CHILLS WITHOUT FEVER 03/31/2010  . ABDOMINAL PAIN, PERIUMBILICAL 27/09/2374  . VITAMIN D DEFICIENCY 05/23/2008  . Palpitations 05/23/2008  . HYPERCHOLESTEROLEMIA 07/27/2007  . HYPERTENSION 07/27/2007  . DEPRESSION 12/06/2006  . GERD 12/06/2006  . DYSPEPSIA 12/06/2006  . DIVERTICULOSIS, COLON 12/06/2006  . Irritable bowel syndrome 12/06/2006  . ABDOMINAL PAIN, CHRONIC 12/06/2006  . HYPERGLYCEMIA 12/06/2006    Past Surgical History:  Procedure Laterality Date  . APPENDECTOMY    . CATARACT EXTRACTION    . [place]   secondary to abd. pain  . thumb surgery     for fracture    OB History    No data available       Home Medications    Prior to Admission medications   Medication Sig Start Date End Date Taking? Authorizing Provider  ALPRAZolam Duanne Moron) 0.25 MG tablet Take 0.25 mg by mouth at bedtime as needed  for sleep.     [provider]  amLODipine (NORVASC) 5 MG tablet TAKE 1 TABLET EVERY DAY Patient taking differently: Take 5 mg by mouth every morning.  09/13/12   Renato Shin, MD  cholecalciferol (VITAMIN D) 1000 UNITS tablet Take 1,000 Units by mouth every morning.    [provider]  HYDROcodone-acetaminophen (NORCO/VICODIN) 5-325 MG tablet Take 1 tablet by mouth every 4-6 hours as needed for abdominal pain 03/14/15   Nandigam, Venia Minks, MD  levothyroxine (SYNTHROID, LEVOTHROID) 88 MCG tablet Take 88  mcg by mouth daily before breakfast.    [provider]  mirtazapine (REMERON) 7.5 MG tablet Take 3.25 mg by mouth at bedtime.     [provider]  morphine (MSIR) 15 MG tablet Take 0.5 tablets (7.5 mg total) by mouth every 4 (four) hours as needed for severe pain. 06/20/17   Deno Etienne, DO  omeprazole (PRILOSEC) 20 MG capsule TAKE ONE CAPSULE EVERY DAY Patient taking differently: Take 20 mg by mouth every morning.  08/18/12   Renato Shin, MD  predniSONE (DELTASONE) 20 MG tablet 3 tabs po daily x 5 days 06/20/17   Deno Etienne, DO  valACYclovir (VALTREX) 1000 MG tablet Take 1 tablet (1,000 mg total) by mouth 3 (three) times daily. 06/20/17   Deno Etienne, DO    Family History Family History  Problem Relation Age of Onset  . Heart disease Mother   . Diabetes Brother   . Prostate cancer Brother   . Colon cancer Neg Hx     Social History Social History   Tobacco Use  . Smoking status: Never Smoker  . Smokeless tobacco: Never Used  Substance Use Topics  . Alcohol use: Yes    Alcohol/week: 0.0 oz    Comment: very rare  . Drug use: No     Allergies   Patient has no known allergies.   Review of Systems Review of Systems  Constitutional: Negative for chills and fever.  HENT: Negative for congestion and rhinorrhea.        Facial pain  Eyes: Negative for redness and visual disturbance.  Respiratory: Negative for shortness of breath and wheezing.   Cardiovascular: Negative for chest pain and palpitations.  Gastrointestinal: Negative for nausea and vomiting.  Genitourinary: Negative for dysuria and urgency.  Musculoskeletal: Negative for arthralgias and myalgias.  Skin: Negative for pallor and wound.  Neurological: Positive for headaches. Negative for dizziness.     Physical Exam Updated Vital Signs BP (!) 185/94 (BP Location: Right Arm)   Pulse 93   Temp 97.7 F (36.5 C) (Oral)   Resp 20   Ht 5\' 3"  (1.6 m)   Wt 59 kg (130 lb)   SpO2 100%   BMI 23.03  kg/m   Physical Exam  Constitutional: She is oriented to person, place, and time. She appears well-developed and well-nourished. No distress.  HENT:  Head: Normocephalic and atraumatic.  Vesicular lesion to the left forehead.  Left TM and external auditory canal is unremarkable.  Eyes: EOM are normal. Pupils are equal, round, and reactive to light.  Slit lamp exam:      The left eye shows no corneal abrasion, no corneal flare, no foreign body, no hyphema, no hypopyon and no fluorescein uptake.  Neck: Normal range of motion. Neck supple.  Cardiovascular: Normal rate and regular rhythm. Exam reveals no gallop and no friction rub.  No murmur heard. Pulmonary/Chest: Effort normal. She has no wheezes. She has no rales.  Abdominal: Soft.  She exhibits no distension. There is no tenderness.  Musculoskeletal: She exhibits no edema or tenderness.  Neurological: She is alert and oriented to person, place, and time. She has normal strength. No cranial nerve deficit or sensory deficit. She displays a negative Romberg sign. Coordination and gait normal.  Skin: Skin is warm and dry. She is not diaphoretic.  Psychiatric: She has a normal mood and affect. Her behavior is normal.  Nursing note and vitals reviewed.    ED Treatments / Results  Labs (all labs ordered are listed, but only abnormal results are displayed) Labs Reviewed - No data to display  EKG  EKG Interpretation None       Radiology No results found.  Procedures Procedures (including critical care time)  Medications Ordered in ED Medications  morphine (MSIR) tablet 7.5 mg (not administered)  tetracaine (PONTOCAINE) 0.5 % ophthalmic solution 2 drop (2 drops Left Eye Given by Other 06/20/17 1138)  fluorescein ophthalmic strip 1 strip (1 strip Left Eye Given by Other 06/20/17 1138)  acetaminophen (TYLENOL) tablet 1,000 mg (1,000 mg Oral Given 06/20/17 1139)     Initial Impression / Assessment and Plan / ED Course  I have  reviewed the triage vital signs and the nursing notes.  Pertinent labs & imaging results that were available during my care of the patient were reviewed by me and considered in my medical decision making (see chart for details).     83 yo F with a chief complaint of left-sided facial pain and headache.  Going on for the past 4 days.  Clinically the patient has shingles.  Will obtain a fluorescein exam to evaluate for eye involvement.  The patient was sent from urgent care for advanced imaging.  I do not feel that this is warranted.  She is a normal neurologic exam clinically has shingles.  Start on meds.  D/c home.   12:07 PM:  I have discussed the diagnosis/risks/treatment options with the patient and family and believe the pt to be eligible for discharge home to follow-up with Optho. We also discussed returning to the ED immediately if new or worsening sx occur. We discussed the sx which are most concerning (e.g., sudden worsening pain, fever, inability to tolerate by mouth) that necessitate immediate return. Medications administered to the patient during their visit and any new prescriptions provided to the patient are listed below.  Medications given during this visit Medications  morphine (MSIR) tablet 7.5 mg (not administered)  tetracaine (PONTOCAINE) 0.5 % ophthalmic solution 2 drop (2 drops Left Eye Given by Other 06/20/17 1138)  fluorescein ophthalmic strip 1 strip (1 strip Left Eye Given by Other 06/20/17 1138)  acetaminophen (TYLENOL) tablet 1,000 mg (1,000 mg Oral Given 06/20/17 1139)     The patient appears reasonably screen and/or stabilized for discharge and I doubt any other medical condition or other [facility] requiring further screening, evaluation, or treatment in the ED at this time prior to discharge.    Final Clinical Impressions(s) / ED Diagnoses   Final diagnoses:  Herpes zoster without complication    ED Discharge Orders        Ordered    valACYclovir (VALTREX) 1000  MG tablet  3 times daily     06/20/17 1202    predniSONE (DELTASONE) 20 MG tablet     06/20/17 1202    morphine (MSIR) 15 MG tablet  Every 4 hours PRN     06/20/17 Santel, Tommye Lehenbauer, DO 06/20/17  1207  

## 2018-03-07 MED FILL — SHINGRIX 50 MCG SUS: 50 | 1 days supply | Qty: 1 | Fill #0

## 2018-04-24 ENCOUNTER — Other Ambulatory Visit: Payer: Self-pay | Admitting: Psychiatry

## 2018-05-24 MED FILL — SHINGRIX 50 MCG SUS: 50 | 1 days supply | Qty: 1 | Fill #1

## 2018-08-18 ENCOUNTER — Other Ambulatory Visit: Payer: Self-pay

## 2018-08-18 ENCOUNTER — Telehealth: Payer: Self-pay | Admitting: Psychiatry

## 2018-08-18 MED ORDER — ALPRAZOLAM 0.25 MG PO TABS: ORAL_TABLET | ORAL | 0 refills | Status: DC

## 2018-08-18 NOTE — Telephone Encounter (Signed)
Patient left vm today @1 :40 stating that she doesn't have anymore refills on the Alprazolam, canceled appt., for 07/15 on 03/12, pt stated on vm msg that she has appt., on this day not realizing she canceled the appt.

## 2018-08-18 NOTE — Telephone Encounter (Signed)
Last refill 04/19 Not seen in epic

## 2018-09-22 ENCOUNTER — Encounter: Payer: Self-pay | Admitting: Psychiatry

## 2018-09-22 ENCOUNTER — Other Ambulatory Visit: Payer: Self-pay

## 2018-09-22 ENCOUNTER — Ambulatory Visit: Payer: Medicare Other | Admitting: Psychiatry

## 2018-09-22 DIAGNOSIS — F411 Generalized anxiety disorder: Secondary | ICD-10-CM

## 2018-09-22 DIAGNOSIS — F5105 Insomnia due to other mental disorder: Secondary | ICD-10-CM

## 2018-09-22 MED ORDER — ALPRAZOLAM 0.25 MG PO TABS: ORAL_TABLET | ORAL | 5 refills | Status: DC

## 2018-09-22 MED ORDER — MIRTAZAPINE 7.5 MG PO TABS: 7.5000 mg | ORAL_TABLET | Freq: Every day | ORAL | 3 refills | Status: DC

## 2018-09-22 NOTE — Progress Notes (Signed)
NATALIA WITTMEYER [id number] 1934/07/15 84 y.o.  Subjective:   Patient ID:  Kara Davis is a 84 y.o. (DOB 07-27-1934) female.  Chief Complaint:  Chief Complaint  Patient presents with  . Anxiety    med management  . Sleeping Problem    HPI CONSUELO SUTHERS presents to the office today for follow-up of generalized anxiety disorder and chronic insomnia.  She also has chronic GI pain which interferes with her sleep.  Last seen October 19, 2017.  No meds were changed.  Recent PE OK.  Change generic Xanax and harder to break and cut.  Manageable  Takes between 1 of 0.25 mg and up to 3 nightly depending on abd pain interfering with sleep  No new concerns or complaints.  Occ insomnia but not always.  Patient reports stable mood and denies depressed or irritable moods.  Patient denies any recent difficulty with anxiety.  Patient denies difficulty with sleep initiation or maintenance. Denies appetite disturbance.  Patient reports that energy and motivation have been good.  Patient denies any difficulty with concentration.  Patient denies any suicidal ideation.  Watches TV and walking with daughter an hour.  Used to go to [employer].  Bible study hasn't started back.  Past psychiatric meds: Multiple med intolerances: Nefazodone for several years, mirtazapine, alprazolam, lorazepam, duloxetine, amitriptyline, SSRI intolerant, gabapentin, temazepam, amitriptyline, melatonin, venlafaxine insomnia, trazodone  Review of Systems:  Review of Systems  Gastrointestinal: Positive for abdominal distention and abdominal pain.  Neurological: Negative for tremors and weakness.    Medications: I have reviewed the patient's current medications.  Current Outpatient Medications  Medication Sig Dispense Refill  . ALPRAZolam (XANAX) 0.25 MG tablet TAKE 3 TABLETS EVERY DAY AT BEDTIME 90 tablet 0  . amLODipine (NORVASC) 5 MG tablet TAKE 1 TABLET EVERY DAY (Patient taking differently: Take 5 mg by mouth every morning. ) 90  tablet 3  . cholecalciferol (VITAMIN D) 1000 UNITS tablet Take 1,000 Units by mouth every morning.    Marland Kitchen HYDROcodone-acetaminophen (NORCO/VICODIN) 5-325 MG tablet Take 1 tablet by mouth every 4-6 hours as needed for abdominal pain 30 tablet 0  . levothyroxine (SYNTHROID, LEVOTHROID) 88 MCG tablet Take 88 mcg by mouth daily before breakfast.    . mirtazapine (REMERON) 7.5 MG tablet Take 3.25 mg by mouth at bedtime.     Marland Kitchen omeprazole (PRILOSEC) 20 MG capsule TAKE ONE CAPSULE EVERY DAY (Patient not taking: Reported on 09/22/2018) 90 capsule 2  . valACYclovir (VALTREX) 1000 MG tablet Take 1 tablet (1,000 mg total) by mouth 3 (three) times daily. (Patient not taking: Reported on 09/22/2018) 21 tablet 0   No current facility-administered medications for this visit.     Medication Side Effects: None  Allergies: No Known Allergies  Past Medical History:  Diagnosis Date  . Aneurysm ([place])    over left eye  . Cataracts, bilateral   . DEPRESSION 12/06/2006   Dr. Clovis Pu  . DIVERTICULOSIS, COLON 12/06/2006  . GERD 12/06/2006  . HYPERCHOLESTEROLEMIA 07/27/2007  . HYPERTENSION 07/27/2007  . Irritable bowel syndrome 12/06/2006  . Osteoporosis    rx refused  . Palpitations 05/23/2008  . Tubular adenoma 09/09/2009  . VITAMIN D DEFICIENCY 05/23/2008    Family History  Problem Relation Age of Onset  . Heart disease Mother   . Diabetes Brother   . Prostate cancer Brother   . Colon cancer Neg Hx     Social History   Socioeconomic History  . Marital status: Widowed    Spouse  name: Not on file  . Number of children: 2  . Years of education: Not on file  . Highest education level: Not on file  Occupational History  . Occupation: Unemployed    Employer: RETIRED  Social Needs  . Financial resource strain: Not on file  . Food insecurity    Worry: Not on file    Inability: Not on file  . Transportation needs    Medical: Not on file    Non-medical: Not on file  Tobacco Use  . Smoking status: Never  Smoker  . Smokeless tobacco: Never Used  Substance and Sexual Activity  . Alcohol use: Yes    Alcohol/week: 0.0 standard drinks    Comment: very rare  . Drug use: No  . Sexual activity: Not on file  Lifestyle  . Physical activity    Days per week: Not on file    Minutes per session: Not on file  . Stress: Not on file  Relationships  . Social Herbalist on phone: Not on file    Gets together: Not on file    Attends religious service: Not on file    Active member of club or organization: Not on file    Attends meetings of clubs or organizations: Not on file    Relationship status: Not on file  . Intimate partner violence    Fear of current or ex partner: Not on file    Emotionally abused: Not on file    Physically abused: Not on file    Forced sexual activity: Not on file  Other Topics Concern  . Not on file  Social History Narrative   Divorced x many years   Daily Caffeine Use-1 cup of 1/2 caf coffee    Past Medical History, Surgical history, Social history, and Family history were reviewed and updated as appropriate.   Please see review of systems for further details on the patient's review from today.   Objective:   Physical Exam:  There were no vitals taken for this visit.  Physical Exam Constitutional:      General: She is not in acute distress.    Appearance: She is well-developed.  Musculoskeletal:        General: No deformity.  Neurological:     Mental Status: She is alert and oriented to person, place, and time.     Coordination: Coordination normal.  Psychiatric:        Attention and Perception: Attention normal. She is attentive.        Mood and Affect: Mood normal. Mood is not anxious or depressed. Affect is not labile, blunt, angry or inappropriate.        Speech: Speech normal.        Behavior: Behavior normal.        Thought Content: Thought content normal. Thought content does not include homicidal or suicidal ideation. Thought content  does not include homicidal or suicidal plan.        Cognition and Memory: Cognition normal.        Judgment: Judgment normal.     Comments: Insight is good. Anxiety is managed. Lip smacking  Not bothersome and not interfering.     Lab Review:     Component Value Date/Time   NA 132 (L) 05/25/2014 1336   K 4.0 05/25/2014 1336   CL 100 05/25/2014 1336   CO2 23 05/25/2014 1336   GLUCOSE 91 05/25/2014 1336   BUN 10 05/25/2014 1336   CREATININE  0.64 05/25/2014 1336   CALCIUM 8.9 05/25/2014 1336   CALCIUM 9.6 05/23/2008 0000   PROT 7.7 05/25/2014 1336   ALBUMIN 4.3 05/25/2014 1336   AST 26 05/25/2014 1336   ALT 18 05/25/2014 1336   ALKPHOS 64 05/25/2014 1336   BILITOT 0.5 05/25/2014 1336   GFRNONAA 84 (L) 05/25/2014 1336   GFRAA >90 05/25/2014 1336       Component Value Date/Time   WBC 8.1 05/25/2014 1336   RBC 4.99 05/25/2014 1336   HGB 13.5 05/25/2014 1336   HCT 40.6 05/25/2014 1336   PLT 259 05/25/2014 1336   MCV 81.4 05/25/2014 1336   MCH 27.1 05/25/2014 1336   MCHC 33.3 05/25/2014 1336   RDW 13.5 05/25/2014 1336   LYMPHSABS 1.9 05/25/2014 1336   MONOABS 0.7 05/25/2014 1336   EOSABS 0.1 05/25/2014 1336   BASOSABS 0.1 05/25/2014 1336    No results found for: POCLITH, LITHIUM   No results found for: PHENYTOIN, PHENOBARB, VALPROATE, CBMZ   .res Assessment: Plan:    Layaan was seen today for anxiety and sleeping problem.  Diagnoses and all orders for this visit:  Generalized anxiety disorder  Insomnia due to mental condition  Overall satisfied with meds and anxiety in control. No indication for change.  Her quality of life would be severely impaired without Xanax.  She's tolerating it well and is aware of the risks discussed many times. We discussed the short-term risks associated with benzodiazepines including sedation and increased fall risk among others.  Discussed long-term side effect risk including dependence, potential withdrawal symptoms, and the  potential eventual dose-related risk of dementia. Disc differences between generics of Xanax.  Continue mirtazapine 3.75 mg HS and Xanax   FU 1 year bc stable without med changes in years.  Lynder Parents, MD, DFAPA  Please see After Visit Summary for patient specific instructions.  No future appointments.  No orders of the defined types were placed in this encounter.   -------------------------------

## 2018-10-05 ENCOUNTER — Ambulatory Visit: Payer: Self-pay | Admitting: Psychiatry

## 2018-10-18 ENCOUNTER — Ambulatory Visit: Payer: Self-pay | Admitting: Psychiatry

## 2018-11-29 ENCOUNTER — Encounter: Payer: Self-pay | Admitting: Podiatry

## 2018-11-29 ENCOUNTER — Other Ambulatory Visit: Payer: Self-pay

## 2018-11-29 ENCOUNTER — Ambulatory Visit: Payer: Medicare Other | Admitting: Podiatry

## 2018-11-29 VITALS — BP 155/76 | HR 65

## 2018-11-29 DIAGNOSIS — B351 Tinea unguium: Secondary | ICD-10-CM | POA: Diagnosis not present

## 2018-11-29 DIAGNOSIS — M2042 Other hammer toe(s) (acquired), left foot: Secondary | ICD-10-CM

## 2018-11-29 DIAGNOSIS — M79675 Pain in left toe(s): Secondary | ICD-10-CM

## 2018-11-29 DIAGNOSIS — M2011 Hallux valgus (acquired), right foot: Secondary | ICD-10-CM | POA: Diagnosis not present

## 2018-11-29 DIAGNOSIS — M2041 Other hammer toe(s) (acquired), right foot: Secondary | ICD-10-CM

## 2018-11-29 DIAGNOSIS — M79674 Pain in right toe(s): Secondary | ICD-10-CM | POA: Diagnosis not present

## 2018-11-29 DIAGNOSIS — M2012 Hallux valgus (acquired), left foot: Secondary | ICD-10-CM

## 2018-11-29 NOTE — Patient Instructions (Signed)
Diabetes Mellitus and Foot Care Foot care is an important part of your health, especially when you have diabetes. Diabetes may cause you to have problems because of poor blood flow (circulation) to your feet and legs, which can cause your skin to:  Become thinner and drier.  Break more easily.  Heal more slowly.  Peel and crack. You may also have nerve damage (neuropathy) in your legs and feet, causing decreased feeling in them. This means that you may not notice minor injuries to your feet that could lead to more serious problems. Noticing and addressing any potential problems early is the best way to prevent future foot problems. How to care for your feet Foot hygiene  Wash your feet daily with warm water and mild soap. Do not use hot water. Then, pat your feet and the areas between your toes until they are completely dry. Do not soak your feet as this can dry your skin.  Trim your toenails straight across. Do not dig under them or around the cuticle. File the edges of your nails with an emery board or nail file.  Apply a moisturizing lotion or petroleum jelly to the skin on your feet and to dry, brittle toenails. Use lotion that does not contain alcohol and is unscented. Do not apply lotion between your toes. Shoes and socks  Wear clean socks or stockings every day. Make sure they are not too tight. Do not wear knee-high stockings since they may decrease blood flow to your legs.  Wear shoes that fit properly and have enough cushioning. Always look in your shoes before you put them on to be sure there are no objects inside.  To break in new shoes, wear them for just a few hours a day. This prevents injuries on your feet. Wounds, scrapes, corns, and calluses  Check your feet daily for blisters, cuts, bruises, sores, and redness. If you cannot see the bottom of your feet, use a mirror or ask someone for help.  Do not cut corns or calluses or try to remove them with medicine.  If you  find a minor scrape, cut, or break in the skin on your feet, keep it and the skin around it clean and dry. You may clean these areas with mild soap and water. Do not clean the area with peroxide, alcohol, or iodine.  If you have a wound, scrape, corn, or callus on your foot, look at it several times a day to make sure it is healing and not infected. Check for: ? Redness, swelling, or pain. ? Fluid or blood. ? Warmth. ? Pus or a bad smell. General instructions  Do not cross your legs. This may decrease blood flow to your feet.  Do not use heating pads or hot water bottles on your feet. They may burn your skin. If you have lost feeling in your feet or legs, you may not know this is happening until it is too late.  Protect your feet from hot and cold by wearing shoes, such as at the beach or on hot pavement.  Schedule a complete foot exam at least once a year (annually) or more often if you have foot problems. If you have foot problems, report any cuts, sores, or bruises to your health care provider immediately. Contact a health care provider if:  You have a medical condition that increases your risk of infection and you have any cuts, sores, or bruises on your feet.  You have an injury that is not   healing.  You have redness on your legs or feet.  You feel burning or tingling in your legs or feet.  You have pain or cramps in your legs and feet.  Your legs or feet are numb.  Your feet always feel cold.  You have pain around a toenail. Get help right away if:  You have a wound, scrape, corn, or callus on your foot and: ? You have pain, swelling, or redness that gets worse. ? You have fluid or blood coming from the wound, scrape, corn, or callus. ? Your wound, scrape, corn, or callus feels warm to the touch. ? You have pus or a bad smell coming from the wound, scrape, corn, or callus. ? You have a fever. ? You have a red line going up your leg. Summary  Check your feet every day  for cuts, sores, red spots, swelling, and blisters.  Moisturize feet and legs daily.  Wear shoes that fit properly and have enough cushioning.  If you have foot problems, report any cuts, sores, or bruises to your health care provider immediately.  Schedule a complete foot exam at least once a year (annually) or more often if you have foot problems. This information is not intended to replace advice given to you by your health care provider. Make sure you discuss any questions you have with your health care provider. Document Released: 03/19/2000 Document Revised: 05/04/2017 Document Reviewed: 04/23/2016 Elsevier Patient Education  2020 [employer]   Onychomycosis/Fungal Toenails  WHAT IS IT? An infection that lies within the keratin of your nail plate that is caused by a fungus.  WHY ME? Fungal infections affect all ages, sexes, races, and creeds.  There may be many factors that predispose you to a fungal infection such as age, coexisting medical conditions such as diabetes, or an autoimmune disease; stress, medications, fatigue, genetics, etc.  Bottom line: fungus thrives in a warm, moist environment and your shoes offer such a location.  IS IT CONTAGIOUS? Theoretically, yes.  You do not want to share shoes, nail clippers or files with someone who has fungal toenails.  Walking around barefoot in the same room or sleeping in the same bed is unlikely to transfer the organism.  It is important to realize, however, that fungus can spread easily from one nail to the next on the same foot.  HOW DO WE TREAT THIS?  There are several ways to treat this condition.  Treatment may depend on many factors such as age, medications, pregnancy, liver and kidney conditions, etc.  It is best to ask your doctor which options are available to you.  1. No treatment.   Unlike many other medical concerns, you can live with this condition.  However for many people this can be a painful condition and may lead to  ingrown toenails or a bacterial infection.  It is recommended that you keep the nails cut short to help reduce the amount of fungal nail. 2. Topical treatment.  These range from herbal remedies to prescription strength nail lacquers.  About 40-50% effective, topicals require twice daily application for approximately 9 to 12 months or until an entirely new nail has grown out.  The most effective topicals are medical grade medications available through physicians offices. 3. Oral antifungal medications.  With an 80-90% cure rate, the most common oral medication requires 3 to 4 months of therapy and stays in your system for a year as the new nail grows out.  Oral antifungal medications do require   blood work to make sure it is a safe drug for you.  A liver function panel will be performed prior to starting the medication and after the first month of treatment.  It is important to have the blood work performed to avoid any harmful side effects.  In general, this medication safe but blood work is required. 4. Laser Therapy.  This treatment is performed by applying a specialized laser to the affected nail plate.  This therapy is noninvasive, fast, and non-painful.  It is not covered by insurance and is therefore, out of pocket.  The results have been very good with a 80-95% cure rate.  [facility] is the only practice in the area to offer this therapy. 5. Permanent Nail Avulsion.  Removing the entire nail so that a new nail will not grow back. 

## 2018-12-07 ENCOUNTER — Encounter: Payer: Self-pay | Admitting: Podiatry

## 2018-12-07 NOTE — Progress Notes (Signed)
Subjective: Kara Davis presents today referred by Marton Redwood, MD with cc of painful, discolored, thick toenails which interfere with daily activities.  Pain is aggravated when wearing enclosed shoe gear.   Past Medical History:  Diagnosis Date  . Aneurysm ([place])    over left eye  . Cataracts, bilateral   . DEPRESSION 12/06/2006   Dr. Clovis Pu  . DIVERTICULOSIS, COLON 12/06/2006  . GERD 12/06/2006  . HYPERCHOLESTEROLEMIA 07/27/2007  . HYPERTENSION 07/27/2007  . Irritable bowel syndrome 12/06/2006  . Osteoporosis    rx refused  . Palpitations 05/23/2008  . Tubular adenoma 09/09/2009  . VITAMIN D DEFICIENCY 05/23/2008     Patient Active Problem List   Diagnosis Date Noted  . Conductive hearing loss of both ears 09/17/2015  . Dysfunction of both eustachian tubes 09/17/2015  . Belching 04/20/2013  . Nausea alone 11/02/2012  . Unspecified constipation 11/02/2012  . Dysuria 11/02/2012  . Pyuria 03/20/2012  . Knee pain 02/25/2012  . Disturbance of skin sensation 02/25/2012  . Aneurysm ([place]) 10/01/2011  . Hyposmolality and/or hyponatremia 03/19/2011  . Anemia, unspecified 03/19/2011  . Aneurysm, 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/15/2011  . UTI (lower urinary tract infection) 03/15/2011  . Hypothyroidism 03/13/2011  . Abdominal pain, other specified site 10/12/2010  . Routine general medical examination at a health care facility 08/19/2010  . Encounter for long-term (current) use of other medications 08/19/2010  . Hematuria 08/19/2010  . Numbness 08/19/2010  . Unspecified disorder of liver 08/19/2010  . UTI 03/31/2010  . CHILLS WITHOUT FEVER 03/31/2010  . ABDOMINAL PAIN, PERIUMBILICAL [id number]  . VITAMIN D DEFICIENCY 05/23/2008  . Palpitations 05/23/2008  . HYPERCHOLESTEROLEMIA 07/27/2007  . HYPERTENSION 07/27/2007  . DEPRESSION 12/06/2006  . GERD 12/06/2006  . DYSPEPSIA 12/06/2006  . DIVERTICULOSIS, COLON 12/06/2006  . Irritable bowel syndrome 12/06/2006  . ABDOMINAL PAIN, CHRONIC  12/06/2006  . HYPERGLYCEMIA 12/06/2006     Past Surgical History:  Procedure Laterality Date  . APPENDECTOMY    . CATARACT EXTRACTION    . [place]   secondary to abd. pain  . thumb surgery     for fracture      Current Outpatient Medications:  .  valsartan (DIOVAN) 80 MG tablet, TAKE 1 TABLET DAILY, Disp: , Rfl:  .  ALPRAZolam (XANAX) 0.25 MG tablet, TAKE 2-3 TABLETS EVERY DAY AT BEDTIME, Disp: 90 tablet, Rfl: 5 .  amLODipine (NORVASC) 5 MG tablet, TAKE 1 TABLET EVERY DAY (Patient taking differently: Take 5 mg by mouth every morning. ), Disp: 90 tablet, Rfl: 3 .  cholecalciferol (VITAMIN D) 1000 UNITS tablet, Take 1,000 Units by mouth every morning., Disp: , Rfl:  .  HYDROcodone-acetaminophen (NORCO/VICODIN) 5-325 MG tablet, Take 1 tablet by mouth every 4-6 hours as needed for abdominal pain, Disp: 30 tablet, Rfl: 0 .  levothyroxine (SYNTHROID) 75 MCG tablet, Take 75 mcg by mouth daily., Disp: , Rfl:  .  levothyroxine (SYNTHROID, LEVOTHROID) 88 MCG tablet, Take 88 mcg by mouth daily before breakfast., Disp: , Rfl:  .  mirtazapine (REMERON) 7.5 MG tablet, Take 1 tablet (7.5 mg total) by mouth at bedtime., Disp: 90 tablet, Rfl: 3 .  omeprazole (PRILOSEC) 20 MG capsule, TAKE ONE CAPSULE EVERY DAY (Patient not taking: Reported on 09/22/2018), Disp: 90 capsule, Rfl: 2 .  valACYclovir (VALTREX) 1000 MG tablet, Take 1 tablet (1,000 mg total) by mouth 3 (three) times daily. (Patient not taking: Reported on 09/22/2018), Disp: 21 tablet, Rfl: 0   No Known Allergies   Social  History   Occupational History  . Occupation: Unemployed    Employer: RETIRED  Tobacco Use  . Smoking status: Never Smoker  . Smokeless tobacco: Never Used  Substance and Sexual Activity  . Alcohol use: Yes    Alcohol/week: 0.0 standard drinks    Comment: very rare  . Drug use: No  . Sexual activity: Not on file     Family History  Problem Relation Age of Onset  . Heart disease Mother   .  Diabetes Brother   . Prostate cancer Brother   . Colon cancer Neg Hx      Immunization History  Administered Date(s) Administered  . Influenza-Unspecified 02/03/2013  . Td 04/05/1993  . Zoster Recombinat (Shingrix) 03/10/2018, 05/25/2018     Review of systems: Positive Findings in bold print.  Constitutional:  chills, fatigue, fever, sweats, weight change Communication: Optometrist, sign Ecologist, hand writing, iPad/Android device Head: headaches, head injury Eyes: changes in vision, eye pain, glaucoma, cataracts, macular degeneration, diplopia, glare,  light sensitivity, eyeglasses or contacts, blindness Ears nose mouth throat: hearing impaired, hearing aids,  ringing in ears, deaf, sign language,  vertigo,   nosebleeds,  rhinitis,  cold sores, snoring, swollen glands Cardiovascular: HTN, edema, arrhythmia, pacemaker in place, defibrillator in place, chest pain/tightness, chronic anticoagulation, blood clot, heart failure, MI Peripheral Vascular: leg cramps, varicose veins, blood clots, lymphedema, varicosities Respiratory:  difficulty breathing, denies congestion, SOB, wheezing, cough, emphysema Gastrointestinal: change in appetite or weight, abdominal pain, constipation, diarrhea, nausea, vomiting, vomiting blood, change in bowel habits, abdominal pain, jaundice, rectal bleeding, hemorrhoids, GERD Genitourinary:  nocturia,  pain on urination, polyuria,  blood in urine, Foley catheter, urinary urgency, ESRD on hemodialysis Musculoskeletal: amputation, cramping, stiff joints, painful joints, decreased joint motion, fractures, OA, gout, hemiplegia, paraplegia, uses cane, wheelchair bound, uses walker, uses rollator Skin: +changes in toenails, color change, dryness, itching, mole changes,  rash, wound(s) Neurological: headaches, numbness in feet, paresthesias in feet, burning in feet, fainting,  seizures, change in speech. denies headaches, memory problems/poor historian, cerebral  palsy, weakness, paralysis, CVA, TIA Endocrine: diabetes, hypothyroidism, hyperthyroidism,  goiter, dry mouth, flushing, heat intolerance,  cold intolerance,  excessive thirst, denies polyuria,  nocturia Hematological:  easy bleeding, excessive bleeding, easy bruising, enlarged lymph nodes, on long term blood thinner, history of past transusions Allergy/immunological:  hives, eczema, frequent infections, multiple drug allergies, seasonal allergies, transplant recipient, multiple food allergies Psychiatric:  anxiety, depression, mood disorder, suicidal ideations, hallucinations, insomnia  Objective: Vitals:   11/29/18 1614  BP: (!) 155/76  Pulse: 65    Vascular Examination: Capillary refill time immediate x 10 digits.  Dorsalis pedis pulses palpable b/l.   Posterior tibial pulses palpable b/l.   No digital hair x 10 digits.  Skin temperature gradient WNL b/l.  Dermatological Examination: Skin with normal turgor, texture and tone b/l.  Toenails 1-5 b/l discolored, thick, dystrophic with subungual debris and pain with palpation to nailbeds due to thickness of nails.  Musculoskeletal: Muscle strength 5/5 to all LE muscle groups.  HAV with bunion b/l.  Hammertoes 2-5 b/l.  Neurological: Sensation intact with 10 gram monofilament.  Vibratory sensation intact.  Assessment: 1. Painful onychomycosis toenails 1-5 b/l  2. HAV with bunion b/l 3. Hammertoes b/l 2-5  Plan: 1. Discussed onychomycosis and treatment options.  Literature dispensed on today. 2. Toenails 1-5 b/l were debrided in length and girth without iatrogenic bleeding. 3. Patient to continue soft, supportive shoe gear daily. 4. Patient to report any pedal injuries to medical  professional immediately. 5. Follow up prn. 6. Patient/POA to call should there be a concern in the interim.

## 2018-12-15 ENCOUNTER — Telehealth: Payer: Self-pay | Admitting: Psychiatry

## 2018-12-15 ENCOUNTER — Other Ambulatory Visit: Payer: Self-pay | Admitting: Psychiatry

## 2018-12-15 DIAGNOSIS — F5105 Insomnia due to other mental disorder: Secondary | ICD-10-CM

## 2018-12-15 DIAGNOSIS — F411 Generalized anxiety disorder: Secondary | ICD-10-CM

## 2018-12-15 MED ORDER — ALPRAZOLAM 0.25 MG PO TABS: ORAL_TABLET | ORAL | 0 refills | Status: DC

## 2018-12-15 NOTE — Telephone Encounter (Signed)
Patient needs an early refill on her xanax. Her daughter is taking her to [place] tomorrow for vacation and she has a refill due on the 9/18th and needs approvval for an early refill

## 2018-12-15 NOTE — Telephone Encounter (Signed)
OK. Pt takes low dose

## 2018-12-15 NOTE — Telephone Encounter (Signed)
No answer. Left pt. A VM letting her know she could go pick it up and to call us if she had any questions.

## 2018-12-15 NOTE — Telephone Encounter (Signed)
Called pharmacy to double check they received okay for early refill, they were very hesitant although Dr. Clovis Pu submitted it with approval. They will fill early this one time, said she's a week early.

## 2019-02-19 ENCOUNTER — Telehealth: Payer: Self-pay | Admitting: Psychiatry

## 2019-02-19 NOTE — Telephone Encounter (Signed)
Kara Davis called because she went to the pharmacy to get her refill of alprazolam.  Kara Davis she last filled it 01/19/19.  But they told her she couldn't get it until 02/25/19  She doesn't understand why she needs to wait.  She only has 2 left.  She said she takes them as prescribed so doesn't know why she would be out early.  Can you please check with the pharmacy to get this cleared up.

## 2019-02-19 NOTE — Telephone Encounter (Signed)
Called pharmacy and they filled her Alprazolam for her yesterday and it's waiting to be picked up. Let her know and she apologized on the confusion.

## 2019-04-18 ENCOUNTER — Other Ambulatory Visit: Payer: Self-pay | Admitting: Psychiatry

## 2019-04-18 DIAGNOSIS — F411 Generalized anxiety disorder: Secondary | ICD-10-CM

## 2019-04-18 DIAGNOSIS — F5105 Insomnia due to other mental disorder: Secondary | ICD-10-CM

## 2019-04-23 ENCOUNTER — Other Ambulatory Visit: Payer: Self-pay

## 2019-04-23 ENCOUNTER — Ambulatory Visit: Payer: Medicare Other | Admitting: Podiatry

## 2019-04-23 DIAGNOSIS — B351 Tinea unguium: Secondary | ICD-10-CM | POA: Diagnosis not present

## 2019-04-23 DIAGNOSIS — M76822 Posterior tibial tendinitis, left leg: Secondary | ICD-10-CM | POA: Diagnosis not present

## 2019-04-23 DIAGNOSIS — M79675 Pain in left toe(s): Secondary | ICD-10-CM | POA: Diagnosis not present

## 2019-04-23 DIAGNOSIS — M79674 Pain in right toe(s): Secondary | ICD-10-CM | POA: Diagnosis not present

## 2019-05-01 NOTE — Progress Notes (Signed)
Subjective:   Patient ID: Kara Davis, female   DOB: 85 y.o.   MRN: [id number]   HPI Patient states she is developed a lot of pain in the inside of her left ankle with no history of injury and also complains about her toenails being thickened elongated and that she cannot cut them and they are painful in shoe gear neuro   ROS      Objective:  Physical Exam  Vascular status intact negative Bevelyn Buckles' sign noted with quite a bit of inflammation of the inside of the left ankle with pain around the posterior tibial tendon as it comes under the medial malleolus inserts and navicular.  Patient is found to have thick yellow brittle nailbeds 1-5 both feet that are painful     Assessment:  Posterior tibial tendinitis left along with mycotic nail infection 1-5 both feet     Plan:  8 H&P condition reviewed and today I went ahead and reviewed x-ray did careful injection of the medial posterior tibial tendon near insertion navicular after explaining chances for rupture and possible future treatments 3 mg Dexasone 5 mg Xylocaine and debrided nailbeds 1-5 both feet with no iatrogenic bleeding.  Reappoint to recheck

## 2019-09-19 ENCOUNTER — Other Ambulatory Visit (HOSPITAL_COMMUNITY): Payer: Self-pay | Admitting: Internal Medicine

## 2019-09-19 DIAGNOSIS — I779 Disorder of arteries and arterioles, unspecified: Secondary | ICD-10-CM

## 2019-09-20 ENCOUNTER — Other Ambulatory Visit: Payer: Self-pay

## 2019-09-20 ENCOUNTER — Ambulatory Visit (HOSPITAL_COMMUNITY)
Admission: RE | Admit: 2019-09-20 | Discharge: 2019-09-20 | Disposition: A | Discharge status: Home / Self Care | Payer: Medicare Other | Source: Ambulatory Visit | Attending: Surgery | Admitting: Surgery

## 2019-09-20 DIAGNOSIS — I779 Disorder of arteries and arterioles, unspecified: Secondary | ICD-10-CM

## 2019-09-25 ENCOUNTER — Ambulatory Visit (INDEPENDENT_AMBULATORY_CARE_PROVIDER_SITE_OTHER): Payer: Medicare Other | Admitting: Psychiatry

## 2019-09-25 ENCOUNTER — Encounter: Payer: Self-pay | Admitting: Psychiatry

## 2019-09-25 ENCOUNTER — Other Ambulatory Visit: Payer: Self-pay

## 2019-09-25 DIAGNOSIS — F411 Generalized anxiety disorder: Secondary | ICD-10-CM | POA: Diagnosis not present

## 2019-09-25 DIAGNOSIS — F5105 Insomnia due to other mental disorder: Secondary | ICD-10-CM

## 2019-09-25 MED ORDER — MIRTAZAPINE 7.5 MG PO TABS: 7.5000 mg | ORAL_TABLET | Freq: Every day | ORAL | 3 refills | Status: DC

## 2019-09-25 MED ORDER — ALPRAZOLAM 0.25 MG PO TABS: 0.2500 mg | ORAL_TABLET | Freq: Three times a day (TID) | ORAL | 5 refills | Status: DC | PRN

## 2019-09-25 NOTE — Progress Notes (Signed)
Kara Davis [id number] [birth year] 85 y.o.  Subjective:   Patient ID:  Kara Davis is a 85 y.o. (DOB 12/18/1934) female.  Chief Complaint:  Chief Complaint  Patient presents with  . Follow-up  . Anxiety    HPI Kara Davis presents to the office today for follow-up of generalized anxiety disorder and chronic insomnia.  She also has chronic GI pain which interferes with her sleep.  seen October 19, 2017.  No meds were changed.  June 2020 appointment with the following observations made: Recent PE OK. Change generic Xanax and harder to break and cut.  Manageable  Takes between 1 of 0.25 mg and up to 3 nightly depending on abd pain interfering with sleep Plan:Continue mirtazapine 3.75 mg HS and Xanax   09/25/2019 appointment with the following notes made. Rare hydrocodone pinch.  Still dealing with chronic pain. Not depressed or anxious.  Except concerned about the sociopolitical environment. 3 great grand children.  Being raised to love the Kara Davis.    Still drives and lives alone.  Not back to church yet or Y but plans.  Walks in neighborhood for an hour.  No new concerns or complaints.  Occ insomnia but not always.  Patient reports stable mood and denies depressed or irritable moods.  Chronic underlying anxiety is manageable.  Be anxious is not scary anymore.   Sleep variable with pain.  Chronic stomach pain.  Patient denies difficulty with sleep initiation or maintenance. Denies appetite disturbance.  Patient reports that energy and motivation have been good.  Patient denies any difficulty with concentration.  Patient denies any suicidal ideation.  Past psychiatric meds: Multiple med intolerances: Nefazodone for several years, mirtazapine,  alprazolam, lorazepam, duloxetine, amitriptyline, SSRI intolerant, gabapentin, temazepam, amitriptyline, melatonin, venlafaxine insomnia, trazodone  Review of Systems:  Review of Systems  Gastrointestinal: Positive for abdominal distention and  abdominal pain.  Neurological: Negative for tremors and weakness.    Medications: I have reviewed the patient's current medications.  Current Outpatient Medications  Medication Sig Dispense Refill  . ALPRAZolam (XANAX) 0.25 MG tablet Take 1 tablet (0.25 mg total) by mouth 3 (three) times daily as needed for anxiety. 90 tablet 5  . amLODipine (NORVASC) 5 MG tablet TAKE 1 TABLET EVERY DAY (Patient taking differently: Take 5 mg by mouth every morning. ) 90 tablet 3  . cholecalciferol (VITAMIN D) 1000 UNITS tablet Take 1,000 Units by mouth every morning.    Marland Kitchen HYDROcodone-acetaminophen (NORCO/VICODIN) 5-325 MG tablet Take 1 tablet by mouth every 4-6 hours as needed for abdominal pain 30 tablet 0  . levothyroxine (SYNTHROID) 75 MCG tablet Take 75 mcg by mouth daily.    Marland Kitchen levothyroxine (SYNTHROID, LEVOTHROID) 88 MCG tablet Take 88 mcg by mouth daily before breakfast.    . mirtazapine (REMERON) 7.5 MG tablet Take 1 tablet (7.5 mg total) by mouth at bedtime. 90 tablet 3  . omeprazole (PRILOSEC) 20 MG capsule TAKE ONE CAPSULE EVERY DAY 90 capsule 2  . valACYclovir (VALTREX) 1000 MG tablet Take 1 tablet (1,000 mg total) by mouth 3 (three) times daily. 21 tablet 0  . valsartan (DIOVAN) 80 MG tablet TAKE 1 TABLET DAILY     No current facility-administered medications for this visit.    Medication Side Effects: None  Allergies: No Known Allergies  Past Medical History:  Diagnosis Date  . Aneurysm ([place])    over left eye  . Cataracts, bilateral   . DEPRESSION 12/06/2006   Dr. Clovis Pu  . DIVERTICULOSIS, COLON 12/06/2006  .  GERD 12/06/2006  . HYPERCHOLESTEROLEMIA 07/27/2007  . HYPERTENSION 07/27/2007  . Irritable bowel syndrome 12/06/2006  . Osteoporosis    rx refused  . Palpitations 05/23/2008  . Tubular adenoma 09/09/2009  . VITAMIN D DEFICIENCY 05/23/2008    Family History  Problem Relation Age of Onset  . Heart disease Mother   . Diabetes Brother   . Prostate cancer Brother   . Colon cancer  Neg Hx     Social History   Socioeconomic History  . Marital status: Widowed    Spouse name: Not on file  . Number of children: 2  . Years of education: Not on file  . Highest education level: Not on file  Occupational History  . Occupation: Unemployed    Employer: RETIRED  Tobacco Use  . Smoking status: Never Smoker  . Smokeless tobacco: Never Used  Substance and Sexual Activity  . Alcohol use: Yes    Alcohol/week: 0.0 standard drinks    Comment: very rare  . Drug use: No  . Sexual activity: Not on file  Other Topics Concern  . Not on file  Social History Narrative   Divorced x many years   Daily Caffeine Use-1 cup of 1/2 caf coffee   Social Determinants of Health   Financial Resource Strain:   . Difficulty of Paying Living Expenses:   Food Insecurity:   . Worried About Charity fundraiser in the Last Year:   . Arboriculturist in the Last Year:   Transportation Needs:   . Film/video editor (Medical):   Marland Kitchen Lack of Transportation (Non-Medical):   Physical Activity:   . Days of Exercise per Week:   . Minutes of Exercise per Session:   Stress:   . Feeling of Stress :   Social Connections:   . Frequency of Communication with Friends and Family:   . Frequency of Social Gatherings with Friends and Family:   . Attends Religious Services:   . Active Member of Clubs or Organizations:   . Attends Archivist Meetings:   Marland Kitchen Marital Status:   Intimate Partner Violence:   . Fear of Current or Ex-Partner:   . Emotionally Abused:   Marland Kitchen Physically Abused:   . Sexually Abused:     Past Medical History, Surgical history, Social history, and Family history were reviewed and updated as appropriate.   Please see review of systems for further details on the patient's review from today.   Objective:   Physical Exam:  There were no vitals taken for this visit.  Physical Exam Constitutional:      General: She is not in acute distress.    Appearance: She is  well-developed.  Musculoskeletal:        General: No deformity.  Neurological:     Mental Status: She is alert and oriented to person, place, and time.     Coordination: Coordination normal.  Psychiatric:        Attention and Perception: Attention normal. She is attentive.        Mood and Affect: Mood normal. Mood is not anxious or depressed. Affect is not labile, blunt, angry or inappropriate.        Speech: Speech normal.        Behavior: Behavior normal.        Thought Content: Thought content normal. Thought content does not include homicidal or suicidal ideation. Thought content does not include homicidal or suicidal plan.        Cognition  and Memory: Cognition normal.        Judgment: Judgment normal.     Comments: Insight is good. Anxiety is managed. Lip smacking  Not bothersome and not interfering.     Lab Review:     Component Value Date/Time   NA 132 (L) 05/25/2014 1336   K 4.0 05/25/2014 1336   CL 100 05/25/2014 1336   CO2 23 05/25/2014 1336   GLUCOSE 91 05/25/2014 1336   BUN 10 05/25/2014 1336   CREATININE 0.64 05/25/2014 1336   CALCIUM 8.9 05/25/2014 1336   CALCIUM 9.6 05/23/2008 0000   PROT 7.7 05/25/2014 1336   ALBUMIN 4.3 05/25/2014 1336   AST 26 05/25/2014 1336   ALT 18 05/25/2014 1336   ALKPHOS 64 05/25/2014 1336   BILITOT 0.5 05/25/2014 1336   GFRNONAA 84 (L) 05/25/2014 1336   GFRAA >90 05/25/2014 1336       Component Value Date/Time   WBC 8.1 05/25/2014 1336   RBC 4.99 05/25/2014 1336   HGB 13.5 05/25/2014 1336   HCT 40.6 05/25/2014 1336   PLT 259 05/25/2014 1336   MCV 81.4 05/25/2014 1336   MCH 27.1 05/25/2014 1336   MCHC 33.3 05/25/2014 1336   RDW 13.5 05/25/2014 1336   LYMPHSABS 1.9 05/25/2014 1336   MONOABS 0.7 05/25/2014 1336   EOSABS 0.1 05/25/2014 1336   BASOSABS 0.1 05/25/2014 1336    No results found for: POCLITH, LITHIUM   No results found for: PHENYTOIN, PHENOBARB, VALPROATE, CBMZ   .res Assessment: Plan:    Lyana was  seen today for follow-up and anxiety.  Diagnoses and all orders for this visit:  Generalized anxiety disorder -     mirtazapine (REMERON) 7.5 MG tablet; Take 1 tablet (7.5 mg total) by mouth at bedtime. -     ALPRAZolam (XANAX) 0.25 MG tablet; Take 1 tablet (0.25 mg total) by mouth 3 (three) times daily as needed for anxiety.  Insomnia due to mental condition -     mirtazapine (REMERON) 7.5 MG tablet; Take 1 tablet (7.5 mg total) by mouth at bedtime. -     ALPRAZolam (XANAX) 0.25 MG tablet; Take 1 tablet (0.25 mg total) by mouth 3 (three) times daily as needed for anxiety.  Overall satisfied with meds and anxiety in control. No indication for change.  Her quality of life would be severely impaired without Xanax.  We discussed the short-term risks associated with benzodiazepines including sedation and increased fall risk among others.  Discussed long-term side effect risk including dependence, potential withdrawal symptoms, and the potential eventual dose-related risk of dementia.  But recent studies from 2020 dispute this association between benzodiazepines and dementia risk. Newer studies in 2020 do not support an association with dementia.  Disc differences between generics of Xanax.  Continue mirtazapine 3.75 mg HS and Xanax   FU 1 year bc stable without med changes in years.  Lynder Parents, MD, DFAPA  Please see After Visit Summary for patient specific instructions.  No future appointments.  No orders of the defined types were placed in this encounter.   -------------------------------

## 2019-09-28 ENCOUNTER — Ambulatory Visit: Payer: Medicare Other | Admitting: Psychiatry

## 2020-01-25 ENCOUNTER — Ambulatory Visit: Payer: Medicare Other | Admitting: Podiatry

## 2020-01-25 ENCOUNTER — Other Ambulatory Visit: Payer: Self-pay

## 2020-01-25 DIAGNOSIS — M79674 Pain in right toe(s): Secondary | ICD-10-CM

## 2020-01-25 DIAGNOSIS — B351 Tinea unguium: Secondary | ICD-10-CM

## 2020-01-25 DIAGNOSIS — M79675 Pain in left toe(s): Secondary | ICD-10-CM | POA: Diagnosis not present

## 2020-01-27 ENCOUNTER — Encounter: Payer: Self-pay | Admitting: Podiatry

## 2020-01-27 NOTE — Progress Notes (Signed)
Subjective:  Patient ID: Kara Davis, female    DOB: 1934-04-18,  MRN: [id number]  Kara Davis presents to clinic today for painful thick toenails that are difficult to trim. Pain interferes with ambulation. Aggravating factors include wearing enclosed shoe gear. Pain is relieved with periodic professional debridement..  85 y.o. female presents with the above complaint.  She also c/o painful left ankle for some time now. She has seen Dr. Paulla Dolly for this in the past.  Review of Systems: Negative except as noted in the HPI. Past Medical History:  Diagnosis Date   Aneurysm ([place])    over left eye   Cataracts, bilateral    DEPRESSION 12/06/2006   Dr. Clovis Pu   DIVERTICULOSIS, COLON 12/06/2006   GERD 12/06/2006   HYPERCHOLESTEROLEMIA 07/27/2007   HYPERTENSION 07/27/2007   Irritable bowel syndrome 12/06/2006   Osteoporosis    rx refused   Palpitations 05/23/2008   Tubular adenoma 09/09/2009   VITAMIN D DEFICIENCY 05/23/2008   Past Surgical History:  Procedure Laterality Date   APPENDECTOMY     CATARACT EXTRACTION     EXPLORATORY LAPAROTOMY  1993   secondary to abd. pain   thumb surgery     for fracture    Current Outpatient Medications:    ALPRAZolam (XANAX) 0.25 MG tablet, Take 1 tablet (0.25 mg total) by mouth 3 (three) times daily as needed for anxiety., Disp: 90 tablet, Rfl: 5   amLODipine (NORVASC) 5 MG tablet, TAKE 1 TABLET EVERY DAY (Patient taking differently: Take 5 mg by mouth every morning. ), Disp: 90 tablet, Rfl: 3   cholecalciferol (VITAMIN D) 1000 UNITS tablet, Take 1,000 Units by mouth every morning., Disp: , Rfl:    HYDROcodone-acetaminophen (NORCO/VICODIN) 5-325 MG tablet, Take 1 tablet by mouth every 4-6 hours as needed for abdominal pain, Disp: 30 tablet, Rfl: 0   levothyroxine (SYNTHROID) 75 MCG tablet, Take 75 mcg by mouth daily., Disp: , Rfl:    levothyroxine (SYNTHROID, LEVOTHROID) 88 MCG tablet, Take 88 mcg by mouth daily before breakfast.,  Disp: , Rfl:    mirtazapine (REMERON) 7.5 MG tablet, Take 1 tablet (7.5 mg total) by mouth at bedtime., Disp: 90 tablet, Rfl: 3   omeprazole (PRILOSEC) 20 MG capsule, TAKE ONE CAPSULE EVERY DAY, Disp: 90 capsule, Rfl: 2   valACYclovir (VALTREX) 1000 MG tablet, Take 1 tablet (1,000 mg total) by mouth 3 (three) times daily., Disp: 21 tablet, Rfl: 0   valsartan (DIOVAN) 80 MG tablet, TAKE 1 TABLET DAILY, Disp: , Rfl:  No Known Allergies Social History   Occupational History   Occupation: Unemployed    Employer: RETIRED  Tobacco Use   Smoking status: Never Smoker   Smokeless tobacco: Never Used  Substance and Sexual Activity   Alcohol use: Yes    Alcohol/week: 0.0 standard drinks    Comment: very rare   Drug use: No   Sexual activity: Not on file    Objective:   Constitutional Kara Davis is a pleasant 85 y.o. Caucasian female, in NAD. AAO x 3.   Vascular Capillary refill time to digits immediate b/l. Palpable pedal pulses b/l LE. Pedal hair present. Lower extremity skin temperature gradient within normal limits. No cyanosis or clubbing noted.  Neurologic Normal speech. Oriented to person, place, and time. Protective sensation intact 5/5 intact bilaterally with 10g monofilament b/l. Vibratory sensation intact b/l. Proprioception intact bilaterally.  Dermatologic Pedal skin with normal turgor, texture and tone bilaterally. No open wounds bilaterally. No interdigital macerations bilaterally. Toenails  1-5 b/l elongated, discolored, dystrophic, thickened, crumbly with subungual debris and tenderness to dorsal palpation.  Orthopedic: Normal muscle strength 5/5 to all lower extremity muscle groups bilaterally. Hallux valgus with bunion deformity noted b/l lower extremities. Hammertoes noted to the 2-5 bilaterally.   Radiographs: None Assessment:   1. Pain due to onychomycosis of toenails of both feet    Plan:  Patient was evaluated and treated and all questions  answered.  Onychomycosis with pain -Nails palliatively debridement as below -Educated on self-care  Procedure: Nail Debridement Rationale: Pain Type of Debridement: manual, sharp debridement. Instrumentation: Nail nipper, rotary burr. Number of Nails: 10 -Examined patient. -Toenails 1-5 b/l were debrided in length and girth with sterile nail nippers and dremel without iatrogenic bleeding.  -Patient to report any pedal injuries to medical professional immediately. -For left ankle pain, patient was evaluated/treated by Dr. Posey Pronto. -Patient to continue soft, supportive shoe gear daily. -Patient/POA to call should there be question/concern in the interim.  Return in about 3 months (around 04/26/2020).  Marzetta Board, DPM

## 2020-04-06 ENCOUNTER — Other Ambulatory Visit: Payer: Self-pay | Admitting: Psychiatry

## 2020-04-06 DIAGNOSIS — F5105 Insomnia due to other mental disorder: Secondary | ICD-10-CM

## 2020-04-06 DIAGNOSIS — F411 Generalized anxiety disorder: Secondary | ICD-10-CM

## 2020-05-01 ENCOUNTER — Telehealth: Payer: Self-pay | Admitting: *Deleted

## 2020-05-01 NOTE — Telephone Encounter (Signed)
Called patient, appointment cancelled

## 2020-05-01 NOTE — Telephone Encounter (Signed)
Patient calling to cancel appointment for 05/06/20.

## 2020-05-06 ENCOUNTER — Ambulatory Visit: Payer: Medicare Other | Admitting: Podiatry

## 2020-08-18 ENCOUNTER — Ambulatory Visit: Payer: Medicare Other | Admitting: Podiatry

## 2020-08-18 ENCOUNTER — Encounter: Payer: Self-pay | Admitting: Podiatry

## 2020-08-18 ENCOUNTER — Other Ambulatory Visit: Payer: Self-pay

## 2020-08-18 DIAGNOSIS — M79675 Pain in left toe(s): Secondary | ICD-10-CM | POA: Diagnosis not present

## 2020-08-18 DIAGNOSIS — B353 Tinea pedis: Secondary | ICD-10-CM

## 2020-08-18 DIAGNOSIS — B351 Tinea unguium: Secondary | ICD-10-CM | POA: Diagnosis not present

## 2020-08-18 DIAGNOSIS — M79674 Pain in right toe(s): Secondary | ICD-10-CM | POA: Diagnosis not present

## 2020-08-18 MED ORDER — KETOCONAZOLE 2 % EX CREA: 1.0000 "application " | TOPICAL_CREAM | Freq: Every day | CUTANEOUS | 1 refills | Status: DC

## 2020-08-18 NOTE — Patient Instructions (Signed)

## 2020-08-23 NOTE — Progress Notes (Signed)
  Subjective:  Patient ID: Kara Davis, female    DOB: 1934-11-11,  MRN: [id number]  Kara Davis presents to clinic today for painful thick toenails that are difficult to trim. Pain interferes with ambulation. Aggravating factors include wearing enclosed shoe gear. Pain is relieved with periodic professional debridement.   She voices no new pedal problems on today's visit.   PCP is Dr. Marton Redwood .  No Known Allergies  Review of Systems: Negative except as noted in the HPI. Objective:   Constitutional Kara Davis is a pleasant 86 y.o. Caucasian female, in NAD. AAO x 3.   Vascular Capillary refill time to digits immediate b/l. Palpable pedal pulses b/l LE. Pedal hair present. Lower extremity skin temperature gradient within normal limits. No pain with calf compression b/l. No cyanosis or clubbing noted.  Neurologic Normal speech. Oriented to person, place, and time. Protective sensation intact 5/5 intact bilaterally with 10g monofilament b/l. Vibratory sensation intact b/l.  Dermatologic Pedal skin with normal turgor, texture and tone bilaterally. No open wounds bilaterally. No interdigital macerations bilaterally. Toenails 1-5 b/l elongated, discolored, dystrophic, thickened, crumbly with subungual debris and tenderness to dorsal palpation.  Orthopedic: Normal muscle strength 5/5 to all lower extremity muscle groups bilaterally. No pain crepitus or joint limitation noted with ROM b/l. Hallux valgus with bunion deformity noted b/l lower extremities. Hammertoe(s) noted to the 2-5 bilaterally.   Radiographs: None Assessment:   1. Pain due to onychomycosis of toenails of both feet   2. Tinea pedis of both feet    Plan:  Patient was evaluated and treated and all questions answered.  Onychomycosis with pain -Nails palliatively debridement as below -Educated on self-care  Procedure: Nail Debridement Rationale: Pain Type of Debridement: manual, sharp  debridement. Instrumentation: Nail nipper, rotary burr. Number of Nails: 10 -Examined patient. -Patient to continue soft, supportive shoe gear daily. -Toenails 1-5 b/l were debrided in length and girth with sterile nail nippers and dremel without iatrogenic bleeding.  -Patient to report any pedal injuries to medical professional immediately. -For tinea pedis, prescription sent to pharmacy for Ketoconazole Cream 2% to be applied to both feet and between toes qd x 6 weeks. -Patient/POA to call should there be question/concern in the interim.  Return in about 3 months (around 11/18/2020).  Marzetta Board, DPM

## 2020-09-10 ENCOUNTER — Ambulatory Visit: Payer: Medicare Other | Admitting: Psychiatry

## 2020-09-25 ENCOUNTER — Other Ambulatory Visit: Payer: Self-pay | Admitting: Psychiatry

## 2020-09-25 DIAGNOSIS — F411 Generalized anxiety disorder: Secondary | ICD-10-CM

## 2020-09-25 DIAGNOSIS — F5105 Insomnia due to other mental disorder: Secondary | ICD-10-CM

## 2020-10-13 ENCOUNTER — Other Ambulatory Visit: Payer: Self-pay | Admitting: Psychiatry

## 2020-10-13 DIAGNOSIS — F5105 Insomnia due to other mental disorder: Secondary | ICD-10-CM

## 2020-10-13 DIAGNOSIS — F411 Generalized anxiety disorder: Secondary | ICD-10-CM

## 2020-11-11 ENCOUNTER — Ambulatory Visit (INDEPENDENT_AMBULATORY_CARE_PROVIDER_SITE_OTHER): Payer: Medicare Other | Admitting: Psychiatry

## 2020-11-11 ENCOUNTER — Encounter: Payer: Self-pay | Admitting: Psychiatry

## 2020-11-11 ENCOUNTER — Other Ambulatory Visit: Payer: Self-pay

## 2020-11-11 DIAGNOSIS — F411 Generalized anxiety disorder: Secondary | ICD-10-CM | POA: Diagnosis not present

## 2020-11-11 DIAGNOSIS — F5105 Insomnia due to other mental disorder: Secondary | ICD-10-CM | POA: Diagnosis not present

## 2020-11-11 MED ORDER — MIRTAZAPINE 7.5 MG PO TABS: 7.5000 mg | ORAL_TABLET | Freq: Every day | ORAL | 3 refills | Status: DC

## 2020-11-11 MED ORDER — ALPRAZOLAM 0.25 MG PO TABS: ORAL_TABLET | ORAL | 5 refills | Status: DC

## 2020-11-11 NOTE — Progress Notes (Signed)
ASENCION DEMEESTER GE:[id number] 1935/03/05 86 y.o.  Subjective:   Patient ID:  Kara Davis is a 86 y.o. (DOB 1934-10-11) female.  Chief Complaint:  Chief Complaint  Patient presents with   Follow-up   Anxiety    HPI TERRILEE LAVERDURE presents to the office today for follow-up of generalized anxiety disorder and chronic insomnia.  She also has chronic GI pain which interferes with her sleep.  seen October 19, 2017.  No meds were changed.  June 2020 appointment with the following observations made: Recent PE OK. Change generic Xanax and harder to break and cut.  Manageable  Takes between 1 of 0.25 mg and up to 3 nightly depending on abd pain interfering with sleep Plan:Continue mirtazapine 3.75 mg HS and Xanax   09/25/2019 appointment with the following notes made. Rare hydrocodone pinch.  Still dealing with chronic pain. Not depressed or anxious.  Except concerned about the sociopolitical environment. 3 great grand children.  Being raised to love the [place].    Still drives and lives alone.  Not back to church yet or Y but plans.  Walks in neighborhood for an hour. Plan no med changes  11/11/20 appt noted: Been well for the most part. Not depressed. PCP Brigitte Pulse said she's doing well. Son been sick but getting better.  That will upset her. Trip once yearly with D and needs Xanax for trip. Tries to think positive and faith helps. Chronic episodic abd pain of unkown etiology.  No new concerns or complaints.  Occ insomnia but not always.  Patient reports stable mood and denies depressed or irritable moods.  Chronic underlying anxiety is manageable.  Be anxious is not scary anymore.   Sleep variable with pain.  Chronic stomach pain.  Patient denies difficulty with sleep initiation or maintenance. Denies appetite disturbance.  Patient reports that energy and motivation have been good.  Patient denies any difficulty with concentration.  Patient denies any suicidal ideation.  Past psychiatric meds:  Multiple med intolerances: Nefazodone for several years, mirtazapine,  alprazolam, lorazepam, duloxetine, amitriptyline, SSRI intolerant, gabapentin, temazepam, amitriptyline, melatonin, venlafaxine insomnia, trazodone  Review of Systems:  Review of Systems  Gastrointestinal:  Positive for abdominal pain.  Neurological:  Negative for tremors and weakness.   Medications: I have reviewed the patient's current medications.  Current Outpatient Medications  Medication Sig Dispense Refill   amLODipine (NORVASC) 5 MG tablet TAKE 1 TABLET EVERY DAY (Patient taking differently: Take 5 mg by mouth every morning.) 90 tablet 3   cholecalciferol (VITAMIN D) 1000 UNITS tablet Take 1,000 Units by mouth every morning.     HYDROcodone-acetaminophen (NORCO/VICODIN) 5-325 MG tablet Take 1 tablet by mouth every 4-6 hours as needed for abdominal pain 30 tablet 0   ketoconazole (NIZORAL) 2 % cream Apply 1 application topically daily. Apply to both feet and between toes once daily for 6 weeks 60 g 1   levothyroxine (SYNTHROID) 75 MCG tablet Take 75 mcg by mouth daily.     ALPRAZolam (XANAX) 0.25 MG tablet TAKE 1 TABLET BY MOUTH 3 TIMES DAILY AS NEEDED FOR ANXIETY. **GREENSTONE** 60 tablet 5   mirtazapine (REMERON) 7.5 MG tablet Take 1 tablet (7.5 mg total) by mouth at bedtime. 90 tablet 3   omeprazole (PRILOSEC) 20 MG capsule TAKE ONE CAPSULE EVERY DAY (Patient not taking: Reported on 11/11/2020) 90 capsule 2   valACYclovir (VALTREX) 1000 MG tablet Take 1 tablet (1,000 mg total) by mouth 3 (three) times daily. (Patient not taking: Reported on 11/11/2020)  21 tablet 0   valsartan (DIOVAN) 80 MG tablet TAKE 1 TABLET DAILY (Patient not taking: Reported on 11/11/2020)     No current facility-administered medications for this visit.    Medication Side Effects: None  Allergies: No Known Allergies  Past Medical History:  Diagnosis Date   Aneurysm ([place])    over left eye   Cataracts, bilateral    DEPRESSION 12/06/2006    Dr. Clovis Pu   DIVERTICULOSIS, COLON 12/06/2006   GERD 12/06/2006   HYPERCHOLESTEROLEMIA 07/27/2007   HYPERTENSION 07/27/2007   Irritable bowel syndrome 12/06/2006   Osteoporosis    rx refused   Palpitations 05/23/2008   Tubular adenoma 09/09/2009   VITAMIN D DEFICIENCY 05/23/2008    Family History  Problem Relation Age of Onset   Heart disease Mother    Diabetes Brother    Prostate cancer Brother    Colon cancer Neg Hx     Social History   Socioeconomic History   Marital status: Widowed    Spouse name: Not on file   Number of children: 2   Years of education: Not on file   Highest education level: Not on file  Occupational History   Occupation: Unemployed    Employer: RETIRED  Tobacco Use   Smoking status: Never   Smokeless tobacco: Never  Substance and Sexual Activity   Alcohol use: Yes    Alcohol/week: 0.0 standard drinks    Comment: very rare   Drug use: No   Sexual activity: Not on file  Other Topics Concern   Not on file  Social History Narrative   Divorced x many years   Daily Caffeine Use-1 cup of 1/2 caf coffee   Social Determinants of Health   Financial Resource Strain: Not on file  Food Insecurity: Not on file  Transportation Needs: Not on file  Physical Activity: Not on file  Stress: Not on file  Social Connections: Not on file  Intimate Partner Violence: Not on file    Past Medical History, Surgical history, Social history, and Family history were reviewed and updated as appropriate.   Please see review of systems for further details on the patient's review from today.   Objective:   Physical Exam:  There were no vitals taken for this visit.  Physical Exam Constitutional:      General: She is not in acute distress. Musculoskeletal:        General: No deformity.  Neurological:     Mental Status: She is alert and oriented to person, place, and time.     Coordination: Coordination normal.  Psychiatric:        Attention and Perception:  Attention and perception normal. She does not perceive auditory or visual hallucinations.        Mood and Affect: Mood is anxious. Mood is not depressed. Affect is not labile, blunt, angry or inappropriate.        Speech: Speech normal.        Behavior: Behavior normal.        Thought Content: Thought content normal. Thought content is not paranoid or delusional. Thought content does not include homicidal or suicidal ideation. Thought content does not include homicidal or suicidal plan.        Cognition and Memory: Cognition and memory normal.        Judgment: Judgment normal.     Comments: Insight intact Manageable anxiety. Pleasant.    Lab Review:     Component Value Date/Time   NA 132 (L) 05/25/2014  1336   K 4.0 05/25/2014 1336   CL 100 05/25/2014 1336   CO2 23 05/25/2014 1336   GLUCOSE 91 05/25/2014 1336   BUN 10 05/25/2014 1336   CREATININE 0.64 05/25/2014 1336   CALCIUM 8.9 05/25/2014 1336   CALCIUM 9.6 05/23/2008 0000   PROT 7.7 05/25/2014 1336   ALBUMIN 4.3 05/25/2014 1336   AST 26 05/25/2014 1336   ALT 18 05/25/2014 1336   ALKPHOS 64 05/25/2014 1336   BILITOT 0.5 05/25/2014 1336   GFRNONAA 84 (L) 05/25/2014 1336   GFRAA >90 05/25/2014 1336       Component Value Date/Time   WBC 8.1 05/25/2014 1336   RBC 4.99 05/25/2014 1336   HGB 13.5 05/25/2014 1336   HCT 40.6 05/25/2014 1336   PLT 259 05/25/2014 1336   MCV 81.4 05/25/2014 1336   MCH 27.1 05/25/2014 1336   MCHC 33.3 05/25/2014 1336   RDW 13.5 05/25/2014 1336   LYMPHSABS 1.9 05/25/2014 1336   MONOABS 0.7 05/25/2014 1336   EOSABS 0.1 05/25/2014 1336   BASOSABS 0.1 05/25/2014 1336    No results found for: POCLITH, LITHIUM   No results found for: PHENYTOIN, PHENOBARB, VALPROATE, CBMZ   .res Assessment: Plan:    Mackenzy was seen today for follow-up and anxiety.  Diagnoses and all orders for this visit:  Generalized anxiety disorder -     ALPRAZolam (XANAX) 0.25 MG tablet; TAKE 1 TABLET BY MOUTH 3 TIMES  DAILY AS NEEDED FOR ANXIETY. **GREENSTONE** -     mirtazapine (REMERON) 7.5 MG tablet; Take 1 tablet (7.5 mg total) by mouth at bedtime.  Insomnia due to mental condition -     ALPRAZolam (XANAX) 0.25 MG tablet; TAKE 1 TABLET BY MOUTH 3 TIMES DAILY AS NEEDED FOR ANXIETY. **GREENSTONE** -     mirtazapine (REMERON) 7.5 MG tablet; Take 1 tablet (7.5 mg total) by mouth at bedtime. Overall satisfied with meds and anxiety in control. No indication for change.  Her quality of life would be severely impaired without Xanax.  We discussed the short-term risks associated with benzodiazepines including sedation and increased fall risk among others.  Discussed long-term side effect risk including dependence, potential withdrawal symptoms, and the potential eventual dose-related risk of dementia.  But recent studies from 2020 dispute this association between benzodiazepines and dementia risk. Newer studies in 2020 do not support an association with dementia.  Disc differences between generics of Xanax. Still able to get what she wanted.  Continue mirtazapine 3.75 mg HS and Xanax   FU 1 year bc stable without med changes in years.  Lynder Parents, MD, DFAPA  Please see After Visit Summary for patient specific instructions.  Future Appointments  Date Time Provider [place]  11/17/2020 10:15 AM Marzetta Board, DPM [facility]  12/03/2020  2:30 PM Cottle, [place]., MD [facility] None    No orders of the defined types were placed in this encounter.   -------------------------------

## 2020-11-17 ENCOUNTER — Ambulatory Visit: Payer: Medicare Other | Admitting: Podiatry

## 2020-12-03 ENCOUNTER — Ambulatory Visit: Payer: Medicare Other | Admitting: Psychiatry

## 2021-02-19 ENCOUNTER — Other Ambulatory Visit: Payer: Self-pay | Admitting: Internal Medicine

## 2021-02-19 DIAGNOSIS — F0282 Dementia in other diseases classified elsewhere, unspecified severity, with psychotic disturbance: Secondary | ICD-10-CM

## 2021-03-04 ENCOUNTER — Telehealth: Payer: Self-pay

## 2021-03-04 NOTE — Telephone Encounter (Signed)
Late Entry for 03/03/21

## 2021-03-04 NOTE — Telephone Encounter (Signed)
915 am.  Incoming call from Kara Davis to inquire about Palliative Care services.  She has spoken with [place] in the referral center regarding hospice.  Information provide on Palliative Care Services and differences between Palliative Care and Hospice.  Kara Davis explains patient does not have cancer or DM but is experiencing an overall decline.  8 lb weight loss since June.  Ability to function independently and safely has decreased. Family is working to keep patient home as this is her wish.  Home environment with sanitary concerns per daughter.  After extensive discussion, daughter would like to proceed with Palliative Care.  Advised that our admin team would contact PCP office for and order and then contact her to schedule a home visit.  Daughter voiced appreciation and will await contact from [place].  Information sent to Palliative Care admin to request order from PCP.

## 2021-03-05 ENCOUNTER — Telehealth: Payer: Self-pay

## 2021-03-05 NOTE — Telephone Encounter (Signed)
(  5:09 pm) Palliative care SW scheduled initial visit with patient. Her daughter request that only RN and NP come for visit as not overwhelm patient.  RN/NP Visit scheduled for 03/12/21 @ 10:30 am.

## 2021-03-09 ENCOUNTER — Encounter (HOSPITAL_COMMUNITY): Payer: Self-pay

## 2021-03-09 ENCOUNTER — Emergency Department (HOSPITAL_COMMUNITY): Payer: Medicare Other

## 2021-03-09 ENCOUNTER — Observation Stay (HOSPITAL_COMMUNITY)
Admission: EM | Admit: 2021-03-09 | Discharge: 2021-03-10 | Disposition: A | Discharge status: Home / Self Care | Payer: Medicare Other | Attending: Internal Medicine | Admitting: Internal Medicine

## 2021-03-09 ENCOUNTER — Observation Stay (HOSPITAL_COMMUNITY): Payer: Medicare Other

## 2021-03-09 ENCOUNTER — Observation Stay (HOSPITAL_BASED_OUTPATIENT_CLINIC_OR_DEPARTMENT_OTHER): Payer: Medicare Other

## 2021-03-09 ENCOUNTER — Other Ambulatory Visit: Payer: Self-pay

## 2021-03-09 DIAGNOSIS — Y900 Blood alcohol level of less than 20 mg/100 ml: Secondary | ICD-10-CM | POA: Insufficient documentation

## 2021-03-09 DIAGNOSIS — I728 Aneurysm of other specified arteries: Secondary | ICD-10-CM | POA: Diagnosis not present

## 2021-03-09 DIAGNOSIS — Z79899 Other long term (current) drug therapy: Secondary | ICD-10-CM | POA: Diagnosis not present

## 2021-03-09 DIAGNOSIS — E039 Hypothyroidism, unspecified: Secondary | ICD-10-CM | POA: Diagnosis not present

## 2021-03-09 DIAGNOSIS — F0390 Unspecified dementia without behavioral disturbance: Secondary | ICD-10-CM | POA: Diagnosis not present

## 2021-03-09 DIAGNOSIS — E7800 Pure hypercholesterolemia, unspecified: Secondary | ICD-10-CM | POA: Diagnosis present

## 2021-03-09 DIAGNOSIS — F418 Other specified anxiety disorders: Secondary | ICD-10-CM | POA: Diagnosis present

## 2021-03-09 DIAGNOSIS — G459 Transient cerebral ischemic attack, unspecified: Secondary | ICD-10-CM

## 2021-03-09 DIAGNOSIS — R262 Difficulty in walking, not elsewhere classified: Secondary | ICD-10-CM | POA: Diagnosis not present

## 2021-03-09 DIAGNOSIS — K5730 Diverticulosis of large intestine without perforation or abscess without bleeding: Secondary | ICD-10-CM | POA: Diagnosis present

## 2021-03-09 DIAGNOSIS — R4781 Slurred speech: Principal | ICD-10-CM | POA: Insufficient documentation

## 2021-03-09 DIAGNOSIS — R4701 Aphasia: Secondary | ICD-10-CM | POA: Diagnosis present

## 2021-03-09 DIAGNOSIS — Z8673 Personal history of transient ischemic attack (TIA), and cerebral infarction without residual deficits: Secondary | ICD-10-CM | POA: Insufficient documentation

## 2021-03-09 DIAGNOSIS — Z20822 Contact with and (suspected) exposure to covid-19: Secondary | ICD-10-CM | POA: Diagnosis not present

## 2021-03-09 DIAGNOSIS — I10 Essential (primary) hypertension: Secondary | ICD-10-CM | POA: Diagnosis not present

## 2021-03-09 DIAGNOSIS — K219 Gastro-esophageal reflux disease without esophagitis: Secondary | ICD-10-CM | POA: Diagnosis present

## 2021-03-09 HISTORY — DX: Unspecified dementia, unspecified severity, without behavioral disturbance, psychotic disturbance, mood disturbance, and anxiety: F03.90

## 2021-03-09 LAB — CBC
HCT: 44 % (ref 36.0–46.0)
Hemoglobin: 14.2 g/dL (ref 12.0–15.0)
MCH: 27 pg (ref 26.0–34.0)
MCHC: 32.3 g/dL (ref 30.0–36.0)
MCV: 83.8 fL (ref 80.0–100.0)
Platelets: 320 10*3/uL (ref 150–400)
RBC: 5.25 MIL/uL — ABNORMAL HIGH (ref 3.87–5.11)
RDW: 14 % (ref 11.5–15.5)
WBC: 6 10*3/uL (ref 4.0–10.5)
nRBC: 0 % (ref 0.0–0.2)

## 2021-03-09 LAB — COMPREHENSIVE METABOLIC PANEL
ALT: 18 U/L (ref 0–44)
AST: 21 U/L (ref 15–41)
Albumin: 4.3 g/dL (ref 3.5–5.0)
Alkaline Phosphatase: 60 U/L (ref 38–126)
Anion gap: 11 (ref 5–15)
BUN: 8 mg/dL (ref 8–23)
CO2: 28 mmol/L (ref 22–32)
Calcium: 9.7 mg/dL (ref 8.9–10.3)
Chloride: 99 mmol/L (ref 98–111)
Creatinine, Ser: 0.64 mg/dL (ref 0.44–1.00)
GFR, Estimated: >60 mL/min (ref >60)
Glucose, Bld: 94 mg/dL (ref 70–99)
Potassium: 3.9 mmol/L (ref 3.5–5.1)
Sodium: 138 mmol/L (ref 135–145)
Total Bilirubin: 0.6 mg/dL (ref 0.3–1.2)
Total Protein: 7.8 g/dL (ref 6.5–8.1)

## 2021-03-09 LAB — URINALYSIS, ROUTINE W REFLEX MICROSCOPIC
Bilirubin Urine: NEGATIVE
Glucose, UA: NEGATIVE mg/dL
Hgb urine dipstick: NEGATIVE
Ketones, ur: NEGATIVE mg/dL
Nitrite: NEGATIVE
Protein, ur: NEGATIVE mg/dL
Specific Gravity, Urine: 1.009 (ref 1.005–1.030)
pH: 7 (ref 5.0–8.0)

## 2021-03-09 LAB — I-STAT CHEM 8, ED
BUN: 7 mg/dL — ABNORMAL LOW (ref 8–23)
Calcium, Ion: 1.24 mmol/L (ref 1.15–1.40)
Chloride: 100 mmol/L (ref 98–111)
Creatinine, Ser: 0.6 mg/dL (ref 0.44–1.00)
Glucose, Bld: 91 mg/dL (ref 70–99)
HCT: 44 % (ref 36.0–46.0)
Hemoglobin: 15 g/dL (ref 12.0–15.0)
Potassium: 3.9 mmol/L (ref 3.5–5.1)
Sodium: 138 mmol/L (ref 135–145)
TCO2: 28 mmol/L (ref 22–32)

## 2021-03-09 LAB — DIFFERENTIAL
Abs Immature Granulocytes: 0.01 10*3/uL (ref 0.00–0.07)
Basophils Absolute: 0.1 10*3/uL (ref 0.0–0.1)
Basophils Relative: 1 %
Eosinophils Absolute: 0.1 10*3/uL (ref 0.0–0.5)
Eosinophils Relative: 2 %
Immature Granulocytes: 0 %
Lymphocytes Relative: 16 %
Lymphs Abs: 0.9 10*3/uL (ref 0.7–4.0)
Monocytes Absolute: 0.5 10*3/uL (ref 0.1–1.0)
Monocytes Relative: 9 %
Neutro Abs: 4.3 10*3/uL (ref 1.7–7.7)
Neutrophils Relative %: 72 %

## 2021-03-09 LAB — RESP PANEL BY RT-PCR (FLU A&B, COVID) ARPGX2
Influenza A by PCR: NEGATIVE
Influenza B by PCR: NEGATIVE
SARS Coronavirus 2 by RT PCR: NEGATIVE

## 2021-03-09 LAB — RAPID URINE DRUG SCREEN, HOSP PERFORMED
Amphetamines: NOT DETECTED
Barbiturates: NOT DETECTED
Benzodiazepines: POSITIVE — AB
Cocaine: NOT DETECTED
Opiates: NOT DETECTED
Tetrahydrocannabinol: NOT DETECTED

## 2021-03-09 LAB — APTT: aPTT: 25 seconds (ref 24–36)

## 2021-03-09 LAB — ETHANOL: Alcohol, Ethyl (B): <10 mg/dL (ref <10)

## 2021-03-09 LAB — PROTIME-INR
INR: 0.9 (ref 0.8–1.2)
Prothrombin Time: 12.4 seconds (ref 11.4–15.2)

## 2021-03-09 MED ORDER — ONDANSETRON HCL 4 MG PO TABS: 4.0000 mg | ORAL_TABLET | Freq: Four times a day (QID) | ORAL | Status: DC | PRN

## 2021-03-09 MED ORDER — LEVOTHYROXINE SODIUM 50 MCG PO TABS
75.0000 ug | ORAL_TABLET | Freq: Every day | ORAL | Status: DC
  Administered 2021-03-10: 75 ug via ORAL
  Filled 2021-03-09: qty 1

## 2021-03-09 MED ORDER — ONDANSETRON HCL 4 MG/2ML IJ SOLN: 4.0000 mg | Freq: Four times a day (QID) | INTRAMUSCULAR | Status: DC | PRN

## 2021-03-09 MED ORDER — FENTANYL CITRATE PF 50 MCG/ML IJ SOSY: 25.0000 ug | PREFILLED_SYRINGE | Freq: Every evening | INTRAMUSCULAR | Status: DC | PRN

## 2021-03-09 MED ORDER — STROKE: EARLY STAGES OF RECOVERY BOOK
Freq: Once | Status: AC
  Filled 2021-03-09: qty 1

## 2021-03-09 MED ORDER — ACETAMINOPHEN 325 MG PO TABS
650.0000 mg | ORAL_TABLET | Freq: Four times a day (QID) | ORAL | Status: DC | PRN
  Filled 2021-03-09: qty 2

## 2021-03-09 MED ORDER — LORAZEPAM 2 MG/ML IJ SOLN
0.5000 mg | Freq: Once | INTRAMUSCULAR | Status: AC
Start: 2021-03-09 — End: 2021-03-09
  Administered 2021-03-09: 0.5 mg via INTRAVENOUS
  Filled 2021-03-09: qty 1

## 2021-03-09 MED ORDER — ACETAMINOPHEN 650 MG RE SUPP: 650.0000 mg | Freq: Four times a day (QID) | RECTAL | Status: DC | PRN

## 2021-03-09 MED ORDER — PANTOPRAZOLE SODIUM 40 MG PO TBEC: 40.0000 mg | DELAYED_RELEASE_TABLET | Freq: Every day | ORAL | Status: DC

## 2021-03-09 MED ORDER — ASPIRIN 300 MG RE SUPP
300.0000 mg | Freq: Every day | RECTAL | Status: DC
  Filled 2021-03-09: qty 1

## 2021-03-09 MED ORDER — ALPRAZOLAM 0.25 MG PO TABS
0.2500 mg | ORAL_TABLET | Freq: Two times a day (BID) | ORAL | Status: DC | PRN
  Administered 2021-03-09: 0.25 mg via ORAL
  Filled 2021-03-09: qty 1

## 2021-03-09 MED ORDER — MIRTAZAPINE 7.5 MG PO TABS
3.7500 mg | ORAL_TABLET | Freq: Every day | ORAL | Status: DC
  Administered 2021-03-09: 3.75 mg via ORAL
  Filled 2021-03-09 (×2): qty 1

## 2021-03-09 MED ORDER — ASPIRIN 325 MG PO TABS
325.0000 mg | ORAL_TABLET | Freq: Every day | ORAL | Status: DC
  Filled 2021-03-09: qty 1

## 2021-03-09 NOTE — Progress Notes (Signed)
[facility] admitting physician addendum.  The patient's MRI was negative for acute CVA.  We will cancel the transfer to [facility] and continue the work-up at [place], [place]

## 2021-03-09 NOTE — Progress Notes (Signed)
Carotid artery duplex has been completed. Preliminary results can be found in CV Proc through chart review.   03/09/21 2:59 PM Kara Davis RVT

## 2021-03-09 NOTE — ED Notes (Signed)
Pt saturated depends.  Changed depends, provided peri care, placed purewick and repositioned pt in bed w/ warm blankets.  Pt in NAD at this time.  Will continue to monitor.

## 2021-03-09 NOTE — ED Notes (Signed)
Pt transported to MRI with tech °

## 2021-03-09 NOTE — ED Notes (Signed)
Pt retutn from MRI

## 2021-03-09 NOTE — ED Notes (Signed)
Per daughter, pt told her she feels wet. Pt on purewick. Diaper damp. Pt cleaned and changed

## 2021-03-09 NOTE — H&P (Signed)
History and Physical    Kara Davis ELF:[id number] DOB: 1934/11/30 DOA: 03/09/2021  PCP: Ginger Organ., MD   Patient coming from: Home.   I have personally briefly reviewed patient's old medical records in [place]  Chief Complaint: Slurred speech.  HPI: Kara Davis is a 86 y.o. female with medical history significant of left ophthalmic artery aneurysm, bilateral cataract, mild dementia, depression, anxiety, diverticulosis, GERD, hyperlipidemia, hypertension, IBS, osteoporosis, history of palpitations, tubular adenoma, vitamin D deficiency who was brought to the emergency department due to slurred speech since this morning and they have also observed that she has been leaning to the right side when walking since Thanksgiving.  She denied headache, blurred vision, dizziness, vertigo, but stated she has been very careful walking, particularly on the spare due to her weakness.  No fever, chills, rhinorrhea, sore throat, wheezing, dyspnea or hemoptysis.  Denied chest pain, palpitations, diaphoresis, PND, orthopnea or recent pitting edema of the lower extremities.  She had frequent/chronic abdominal pain due to diverticulosis, had an episode of loose stool this morning, but no vomiting, constipation, melena or hematochezia.  No flank pain, dysuria, frequency or hematuria.  No polyuria, polydipsia, polyphagia or blurred vision.  ED Course: Initial vital signs were temperature 97.8 F, pulse 97, respiration 18, BP 167/68 mmHg and O2 sat 96% on room air.  I ordered lorazepam 0.5 mg IVP for MRI scan premedication.  Lab work: Her urinalysis showed large leukocyte esterase and rare bacteria microscopic examination but was otherwise normal.  CBC showed a white count of 6.0, hemoglobin 14.2 g/dL platelets 320.  Normal PT, INR and PTT.  UDS was positive for benzodiazepine (takes alprazolam at home).  Influenza and coronavirus PCR was negative.  CMP was normal.  Imaging: 2 view chest  radiograph show questionable atelectasis, aortic knob calcification but did not show any acute cardiopulmonary pathology.  CT head without contrast did not show any acute intracranial abnormality.  Please see images and full radiology report for further details.  Review of Systems: As per HPI otherwise all other systems reviewed and are negative.  Past Medical History:  Diagnosis Date   Aneurysm ([place])    over left eye   Cataracts, bilateral    Dementia ([place])    DEPRESSION 12/06/2006   Dr. Clovis Pu   DIVERTICULOSIS, COLON 12/06/2006   GERD 12/06/2006   HYPERCHOLESTEROLEMIA 07/27/2007   HYPERTENSION 07/27/2007   Irritable bowel syndrome 12/06/2006   Osteoporosis    rx refused   Palpitations 05/23/2008   Tubular adenoma 09/09/2009   VITAMIN D DEFICIENCY 05/23/2008   Past Surgical History:  Procedure Laterality Date   APPENDECTOMY     CATARACT EXTRACTION     EXPLORATORY LAPAROTOMY  1993   secondary to abd. pain   thumb surgery     for fracture   Social History  reports that she has never smoked. She has never used smokeless tobacco. She reports current alcohol use. She reports that she does not use drugs.  No Known Allergies  Family History  Problem Relation Age of Onset   Heart disease Mother    Diabetes Brother    Prostate cancer Brother    Colon cancer Neg Hx    Prior to Admission medications   Medication Sig Start Date End Date Taking? Authorizing Provider  ALPRAZolam (XANAX) 0.25 MG tablet TAKE 1 TABLET BY MOUTH 3 TIMES DAILY AS NEEDED FOR ANXIETY. **[facility]** 11/11/20  Yes Cottle, Billey Co., MD  amLODipine (NORVASC) 5 MG  tablet TAKE 1 TABLET EVERY DAY Patient taking differently: Take 5 mg by mouth every morning. 09/13/12  Yes Renato Shin, MD  cholecalciferol (VITAMIN D) 1000 UNITS tablet Take 1,000 Units by mouth every morning.   Yes [provider]  HYDROcodone-acetaminophen (NORCO/VICODIN) 5-325 MG tablet Take 1 tablet by mouth every 4-6 hours as  needed for abdominal pain 03/14/15  Yes Nandigam, Venia Minks, MD  levothyroxine (SYNTHROID) 75 MCG tablet Take 75 mcg by mouth daily. 11/06/18  Yes [provider]  mirtazapine (REMERON) 7.5 MG tablet Take 1 tablet (7.5 mg total) by mouth at bedtime. Patient taking differently: Take 3.75 mg by mouth at bedtime. 1/2 tablet 11/11/20  Yes Cottle, Billey Co., MD  ketoconazole (NIZORAL) 2 % cream Apply 1 application topically daily. Apply to both feet and between toes once daily for 6 weeks 08/18/20   Marzetta Board, DPM  omeprazole (PRILOSEC) 20 MG capsule TAKE ONE CAPSULE EVERY DAY Patient not taking: Reported on 11/11/2020 08/18/12   Renato Shin, MD  valsartan (DIOVAN) 80 MG tablet TAKE 1 TABLET DAILY Patient not taking: Reported on 11/11/2020 07/25/15   [provider]   Physical Exam: Vitals:   03/09/21 1144 03/09/21 1146 03/09/21 1340 03/09/21 1400  BP: (!) 167/68  (!) 156/87 (!) 149/77  Pulse: 97  94 82  Resp: 18  20 14   Temp: 97.8 F (36.6 C)     TempSrc: Oral     SpO2: 96%  96% 97%  Weight:  58.1 kg    Height:  5\' 3"  (1.6 m)     Constitutional: NAD, calm, comfortable Eyes: PERRL, lids and conjunctivae normal ENMT: Mucous membranes are moist. Posterior pharynx clear of any exudate or lesions. Neck: normal, supple, no masses, no thyromegaly Respiratory: clear to auscultation bilaterally, no wheezing, no crackles. Normal respiratory effort. No accessory muscle use.  Cardiovascular: Regular rate and rhythm, no murmurs / rubs / gallops. No extremity edema. 2+ pedal pulses. No carotid bruits.  Abdomen: No distention.  Bowel sounds positive.  Soft, no tenderness, no masses palpated. No hepatosplenomegaly.  Musculoskeletal: Mild generalized weakness.  No clubbing / cyanosis. Good ROM, no contractures. Normal muscle tone.  Skin: Pretibial hyperkeratosis areas. Neurologic: CN 2-12 grossly intact. Sensation intact, DTR normal.  RUE slightly tremulous but no pronator drift.  RLE  4/5 weakness. Psychiatric: Normal judgment and insight. Alert and oriented x 3. Normal mood.   Labs on Admission: I have personally reviewed following labs and imaging studies  CBC: Recent Labs  Lab 03/09/21 1331 03/09/21 1341  WBC 6.0  --   NEUTROABS 4.3  --   HGB 14.2 15.0  HCT 44.0 44.0  MCV 83.8  --   PLT 320  --    Basic Metabolic Panel: Recent Labs  Lab 03/09/21 1331 03/09/21 1341  NA 138 138  K 3.9 3.9  CL 99 100  CO2 28  --   GLUCOSE 94 91  BUN 8 7*  CREATININE 0.64 0.60  CALCIUM 9.7  --    GFR: Estimated Creatinine Clearance: 43.3 mL/min (by C-G formula based on SCr of 0.6 mg/dL).  Liver Function Tests: Recent Labs  Lab 03/09/21 1331  AST 21  ALT 18  ALKPHOS 60  BILITOT 0.6  PROT 7.8  ALBUMIN 4.3   Urine analysis:    Component Value Date/Time   COLORURINE YELLOW 03/09/2021 1245   APPEARANCEUR HAZY (A) 03/09/2021 1245   LABSPEC 1.009 03/09/2021 1245   PHURINE 7.0 03/09/2021 1245  GLUCOSEU NEGATIVE 03/09/2021 [place] 03/09/2021 1245   [place] 03/09/2021 1245   [place] 03/09/2021 1245   [place] 03/09/2021 1245        NITRITE NEGATIVE 03/09/2021 1245   LEUKOCYTESUR LARGE (A) 03/09/2021 1245   Radiological Exams on Admission: DG Chest 2 View  Result Date: 03/09/2021 CLINICAL DATA:  Cough.  Slurred speech. EXAM: CHEST - 2 VIEW COMPARISON:  02/12/2011 FINDINGS: Heart size upper limits of normal. Aortic atherosclerotic calcification is present. The lungs are clear except for minimal atelectasis or scarring at the lung bases. No evidence of lobar consolidation or collapse. No effusion. No significant bone finding. IMPRESSION: Minimal scarring or atelectasis at the lung bases. Otherwise no active disease. Electronically Signed   By: Nelson Chimes M.D.   On: 03/09/2021 12:39   CT HEAD WO CONTRAST  Result Date: 03/09/2021 CLINICAL DATA:  Neuro deficit, acute, stroke suspected slurred speech.  leaning towards left EXAM: CT HEAD WITHOUT CONTRAST TECHNIQUE: Contiguous axial images were obtained from the base of the skull through the vertex without intravenous contrast. COMPARISON:  CT head March 13, 2011. FINDINGS: Brain: No evidence of acute large vascular territory infarction, hemorrhage, hydrocephalus, extra-axial collection or mass lesion/mass effect. Vascular: No hyperdense vessel identified. Skull: No acute fracture. Sinuses/Orbits: Mild paranasal sinus mucosal thickening. Unremarkable orbits. Other: No mastoid effusions. IMPRESSION: No evidence of acute intracranial abnormality. Electronically Signed   By: Margaretha Sheffield M.D.   On: 03/09/2021 13:29   VAS US CAROTID (at [facility] and [facility] only)  Result Date: 03/09/2021 Carotid Arterial Duplex Study Patient Name:  Kara Davis  Date of Exam:   03/09/2021 Medical Rec #: [id number]       Accession #:    [id number] Date of Birth: Aug 03, 1934       Patient Gender: F Patient Age:   81 years Exam Location:  [facility] Procedure:      VAS US CAROTID Referring Phys: Preslei Blakley --------------------------------------------------------------------------------  Indications:       TIA. Risk Factors:      Hypertension. Comparison Study:  09/20/2019 - Right Carotid: Velocities in the right ICA are                    consistent with a 1-39%                    stenosis.                     Left Carotid: Velocities in the left ICA are consistent with                    a 1-39%                    stenosis.                     Vertebrals: Bilateral vertebral arteries demonstrate                    antegrade flow.                    Subclavians: Normal flow hemodynamics were seen in bilateral                    subclavian  arteries. Performing Technologist: Oliver Hum RVT  Examination Guidelines: A complete evaluation includes B-mode imaging, spectral Doppler, color Doppler, and power Doppler as needed of all accessible portions of each  vessel. Bilateral testing is considered an integral part of a complete examination. Limited examinations for reoccurring indications may be performed as noted.  Right Carotid Findings: +----------+--------+--------+--------+-----------------------+--------+           PSV cm/sEDV cm/sStenosisPlaque Description     Comments +----------+--------+--------+--------+-----------------------+--------+ CCA Prox  74      8               smooth and heterogenous         +----------+--------+--------+--------+-----------------------+--------+ CCA Distal81      13              smooth and heterogenous         +----------+--------+--------+--------+-----------------------+--------+ ICA Prox  65      18              smooth and heterogenous         +----------+--------+--------+--------+-----------------------+--------+ ICA Distal72      17                                     tortuous +----------+--------+--------+--------+-----------------------+--------+ ECA       88      6                                               +----------+--------+--------+--------+-----------------------+--------+ +----------+--------+-------+--------+-------------------+           PSV cm/sEDV cmsDescribeArm Pressure (mmHG) +----------+--------+-------+--------+-------------------+ Subclavian116                                        +----------+--------+-------+--------+-------------------+ +---------+--------+--+--------+--+---------+ VertebralPSV cm/s47EDV cm/s11Antegrade +---------+--------+--+--------+--+---------+  Left Carotid Findings: +----------+--------+--------+--------+-----------------------+--------+           PSV cm/sEDV cm/sStenosisPlaque Description     Comments +----------+--------+--------+--------+-----------------------+--------+ CCA Prox  100     17              smooth and heterogenous          +----------+--------+--------+--------+-----------------------+--------+ CCA Distal70      14              smooth and heterogenous         +----------+--------+--------+--------+-----------------------+--------+ ICA Prox  46      15              smooth and heterogenous         +----------+--------+--------+--------+-----------------------+--------+ ICA Distal71      21                                     tortuous +----------+--------+--------+--------+-----------------------+--------+ ECA       73      9                                               +----------+--------+--------+--------+-----------------------+--------+ +----------+--------+--------+--------+-------------------+           PSV cm/sEDV  cm/sDescribeArm Pressure (mmHG) +----------+--------+--------+--------+-------------------+ [id number]                                          +----------+--------+--------+--------+-------------------+ +---------+--------+--+--------+-+---------+ VertebralPSV cm/s42EDV cm/s6Antegrade +---------+--------+--+--------+-+---------+   Summary: Right Carotid: Velocities in the right ICA are consistent with a 1-39% stenosis. Left Carotid: Velocities in the left ICA are consistent with a 1-39% stenosis. Vertebrals: Bilateral vertebral arteries demonstrate antegrade flow. *See table(s) above for measurements and observations.     Preliminary     EKG: Independently reviewed.  Vent. rate 81 BPM PR interval 172 ms QRS duration 85 ms QT/QTcB 367/426 ms P-R-T axes 51 -7 10 Sinus rhythm Atrial premature complex Abnormal R-wave progression, early transition LVH by voltage  Assessment/Plan Principal Problem:   Slurred speech    4/5 Right-sided weakness Observation/telemetry Frequent neurochecks. Swallow screen. Consult PT/OT/SLP. Check fasting lipid/hemoglobin A1c. Check carotid Doppler. Check MRI of brain without contrast. Transfer to [facility] where stroke team is  located  Active Problems:   Essential hypertension Allow permissive hypertension. Resume antihypertensives after penumbra period.    HYPERCHOLESTEROLEMIA Check fasting lipids.    Anxiety with depression Continue mirtazapine 3.75 mg nightly. Continue alprazolam as needed.    GERD Resume PPI.    Diverticulosis of colon Has mild chronic abdominal pain. Monitor for worsening symptoms. Follow-up with GI as an outpatient    Hypothyroidism Continue levothyroxine 75 mcg p.o. daily.     DVT prophylaxis: Lovenox SQ. Code Status:   Full code. Family Communication:  Her daughter was at bedside. Disposition Plan:   Patient is from:  Home.  Anticipated DC to:  Home.  Anticipated DC date:  03/10/2021.  Anticipated DC barriers: Clinical status/pending work-up.  Consults called:   Admission status:  Observation/telemetry.   Severity of Illness: High severity after presenting with slurred speech since this morning and history of apparent right-sided weakness due to leaning when walking towards the right t side since Thanksgiving.  Reubin Milan MD [facility]  How to contact the [facility] Attending or Consulting provider [place] or covering provider during after hours [place], for this patient?   Check the care team in [facility] and look for a) attending/consulting TRH provider listed and b) the [facility] team listed Log into [email or web address] and use [place]'s universal password to access. If you do not have the password, please contact the hospital operator. Locate the [facility] provider you are looking for under [facility] and page to a number that you can be directly reached. If you still have difficulty reaching the provider, please page the [facility] (Director on Call) for the Hospitalists listed on amion for assistance.  03/09/2021, 3:48 PM   This document was prepared with [employer] and may contain some unintended transcription errors.

## 2021-03-09 NOTE — Progress Notes (Addendum)
.  Transition of Care [facility]) - Emergency Department Mini Assessment   Patient Details  Name: Kara Davis MRN: [id number] Date of Birth: 09/08/1934  Transition of Care [facility]) CM/SW Contact:    Illene Regulus, LCSW Phone Number: 03/09/2021, 1:41 PM   Clinical Narrative:   TOC CSW was consulted in regards to pt's living alone. CSW spoke with pt's daughter, who stated her concerns with pt's living alone. Pt's daughter stated she is not looking for placement. Pt's Daughter stated pt does not have a guardian, but she is pt's POA. CSW informed pt's daughter the hospital cannot force pt to live with family as pt is her own guardian.  Pt's daughter stated she has not contacted Adult Protective Services about her concerns. CSW informed pt's daughter about [place] agencies, which is an out-of-pocket expense. CSW explained [place] services to pt's daughter as well.  Pt's daughter stated "I will go somewhere else for resources", then ended the phone call.   Pt has appointment with [facility] for palliative services on dec 8th.    ED Mini Assessment: What brought you to the Emergency Department? : slurred speech  Barriers to Discharge: Continued Medical Work up        Interventions which prevented an admission or readmission: SNF Placement    Patient Contact and Communications        ,                 Admission diagnosis:  Cough, Slurred speech Patient Active Problem List   Diagnosis Date Noted   Conductive hearing loss of both ears 09/17/2015   Dysfunction of both eustachian tubes 09/17/2015   Belching 04/20/2013   Nausea alone 11/02/2012   Unspecified constipation 11/02/2012   Dysuria 11/02/2012   Pyuria 03/20/2012   Knee pain 02/25/2012   Disturbance of skin sensation 02/25/2012   Aneurysm ([place]) 10/01/2011   Hyposmolality and/or hyponatremia 03/19/2011   Anemia, unspecified 03/19/2011   Aneurysm, 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/15/2011   UTI (lower urinary tract  infection) 03/15/2011   Hypothyroidism 03/13/2011   Abdominal pain, other specified site 10/12/2010   Routine general medical examination at a health care facility 08/19/2010   Encounter for long-term (current) use of other medications 08/19/2010   Hematuria 08/19/2010   Numbness 08/19/2010   Unspecified disorder of liver 08/19/2010   UTI 03/31/2010   CHILLS WITHOUT FEVER 03/31/2010   ABDOMINAL PAIN, PERIUMBILICAL 91/50/5697   VITAMIN D DEFICIENCY 05/23/2008   Palpitations 05/23/2008   HYPERCHOLESTEROLEMIA 07/27/2007   HYPERTENSION 07/27/2007   DEPRESSION 12/06/2006   GERD 12/06/2006   DYSPEPSIA 12/06/2006   DIVERTICULOSIS, COLON 12/06/2006   Irritable bowel syndrome 12/06/2006   ABDOMINAL PAIN, CHRONIC 12/06/2006   HYPERGLYCEMIA 12/06/2006   PCP:  Ginger Organ., MD Pharmacy:   [id number] - [place], [place] [address] AT [address] [place] [address] Phone: [phone] Fax: [phone]

## 2021-03-09 NOTE — ED Notes (Signed)
Neuro tele machine at bedside.

## 2021-03-09 NOTE — ED Provider Notes (Signed)
Emergency Medicine Provider Triage Evaluation Note  Kara Davis , a 86 y.o. female  was evaluated in triage.  Pt complains of slurred speech that daughter noticed this AM - son saw pt yesterday and left around 5 PM and did not mention any slurred speech. Daughter does note that she has seen pt leaning towards the left side since Thanksgiving day however pt is very stubborn, lives alone, and did not want to be evaluated. She has also had a cough and rhinorrhea for about 1 week. Daughter reports she is the [facility] and would like to speak with social work  because she does not think it is safe for patient to live alone anymore - she has offered for pt to live with her or her brother however pt has declined. No hx of stroke. Pt declines fall recently.   Review of Systems  Positive: + leaning towards left side, slurred speech, cough, rhinorrhea Negative: - head trauma, LOC, fever, SOB, facial droop, unilateral weakness or numbness  Physical Exam  BP (!) 167/68 (BP Location: Left Arm)   Pulse 97   Temp 97.8 F (36.6 C) (Oral)   Resp 18   Ht 5\' 3"  (1.6 m)   Wt 58.1 kg   SpO2 96%   BMI 22.67 kg/m  Gen:   Awake, no distress   Resp:  Normal effort  MSK:   Moves extremities without difficulty  Other:  CN intact. Strength 5/5 to BUE and BLEs. Sensation intact throughout. Slight slurred speech noted. A & O x4. Normal finger to nose.   Medical Decision Making  Medically screening exam initiated at 12:00 PM.  Appropriate orders placed.  Kara Davis was informed that the remainder of the evaluation will be completed by another provider, this initial triage assessment does not replace that evaluation, and the importance of remaining in the ED until their evaluation is complete.     Eustaquio Maize, PA-C 03/09/21 1202    Dorie Rank, MD 03/11/21 1012

## 2021-03-09 NOTE — ED Notes (Signed)
Patient transported to CT 

## 2021-03-09 NOTE — ED Triage Notes (Signed)
Patient's daughter reports that the patient has been leaning to the right since Thanksgiving. Daughter also reports that when she arrived at her mother's house 1 hour ago she noticed that the patient had slurred speech.  Daughter reports that the patient hasa non productive cough x 1 week.  Daughter also reports that the patient has chronic abdominal pain.

## 2021-03-09 NOTE — ED Notes (Signed)
Pt NAD in bed with family at bedside. A/ox3, per family has been baseline. Daughter states pt has been right leaning since thanksgiving, but this morning was much more noticeable and so they brought her into the ED. LNWT 03/08/2021. Pt clear speech, symmetry noted throughout. NIHSS 1. Pt denies symptoms.

## 2021-03-10 ENCOUNTER — Observation Stay (HOSPITAL_BASED_OUTPATIENT_CLINIC_OR_DEPARTMENT_OTHER): Payer: Medicare Other

## 2021-03-10 ENCOUNTER — Other Ambulatory Visit (HOSPITAL_COMMUNITY): Payer: Medicare Other

## 2021-03-10 DIAGNOSIS — I10 Essential (primary) hypertension: Secondary | ICD-10-CM | POA: Diagnosis not present

## 2021-03-10 DIAGNOSIS — R4781 Slurred speech: Secondary | ICD-10-CM | POA: Diagnosis not present

## 2021-03-10 LAB — LIPID PANEL
Cholesterol: 183 mg/dL (ref 0–200)
HDL: 71 mg/dL (ref >40)
LDL Cholesterol: 104 mg/dL — ABNORMAL HIGH (ref 0–99)
Total CHOL/HDL Ratio: 2.6 RATIO
Triglycerides: 38 mg/dL (ref <150)
VLDL: 8 mg/dL (ref 0–40)

## 2021-03-10 LAB — ECHOCARDIOGRAM COMPLETE
Area-P 1/2: 2.54 cm2
Height: 63 in
P 1/2 time: 365 msec
S' Lateral: 2.5 cm
Weight: 2048 oz

## 2021-03-10 LAB — TSH: TSH: 0.761 u[IU]/mL (ref 0.350–4.500)

## 2021-03-10 LAB — HEMOGLOBIN A1C
Hgb A1c MFr Bld: 5.5 % (ref 4.8–5.6)
Mean Plasma Glucose: 111.15 mg/dL

## 2021-03-10 MED ORDER — DICYCLOMINE HCL 10 MG PO CAPS: 10.0000 mg | ORAL_CAPSULE | Freq: Two times a day (BID) | ORAL | 2 refills | Status: DC

## 2021-03-10 MED ORDER — ROSUVASTATIN CALCIUM 5 MG PO TABS: 5.0000 mg | ORAL_TABLET | Freq: Every day | ORAL | 11 refills | Status: DC

## 2021-03-10 NOTE — Progress Notes (Signed)
Echocardiogram 2D Echocardiogram has been performed.  Oneal Deputy Lillion Elbert RDCS 03/10/2021, 11:32 AM

## 2021-03-10 NOTE — Discharge Summary (Signed)
Physician Discharge Summary  [name] DTO:[id number] DOB: [birth year] DOA: 03/09/2021  PCP: Ginger Organ., MD  Admit date: 03/09/2021 Discharge date: 03/10/2021  Admitted From: Home Disposition: Home   Recommendations for Outpatient Follow-up:  Follow up with PCP in 1-2 weeks Please obtain BMP/CBC in one week  Home Health: Yes Equipment/Devices: Rollator Discharge Condition: Stable CODE STATUS: Full code Diet recommendation: Cardiac Brief/Interim Summary:Kara Davis is a 86 y.o. female with medical history significant of left ophthalmic artery aneurysm, bilateral cataract, mild dementia, depression, anxiety, diverticulosis, GERD, hyperlipidemia, hypertension, IBS, osteoporosis, history of palpitations, tubular adenoma, vitamin D deficiency who was brought to the emergency department due to slurred speech since this morning and they have also observed that she has been leaning to the right side when walking since Thanksgiving.  She denied headache, blurred vision, dizziness, vertigo, but stated she has been very careful walking, particularly on the spare due to her weakness.  No fever, chills, rhinorrhea, sore throat, wheezing, dyspnea or hemoptysis.  Denied chest pain, palpitations, diaphoresis, PND, orthopnea or recent pitting edema of the lower extremities.  She had frequent/chronic abdominal pain due to diverticulosis, had an episode of loose stool this morning, but no vomiting, constipation, melena or hematochezia.  No flank pain, dysuria, frequency or hematuria.  No polyuria, polydipsia, polyphagia or blurred vision.   ED Course: Initial vital signs were temperature 97.8 F, pulse 97, respiration 18, BP 167/68 mmHg and O2 sat 96% on room air.  I ordered lorazepam 0.5 mg IVP for MRI scan premedication.   Lab work: Her urinalysis showed large leukocyte esterase and rare bacteria microscopic examination but was otherwise normal.  CBC showed a white count of 6.0, hemoglobin 14.2  g/dL platelets 320.  Normal PT, INR and PTT.  UDS was positive for benzodiazepine (takes alprazolam at home).  Influenza and coronavirus PCR was negative.  CMP was normal.   Imaging: 2 view chest radiograph show questionable atelectasis, aortic knob calcification but did not show any acute cardiopulmonary pathology.  CT head without contrast did not show any acute intracranial abnormality.  Please see images and full radiology report for further details   Discharge Diagnoses:  Principal Problem:   Slurred speech Active Problems:   HYPERCHOLESTEROLEMIA   Anxiety with depression   Essential hypertension   GERD   Diverticulosis of colon   Hypothyroidism  #1 rule out TIA/CVA-patient was admitted with right-sided weakness and slurred speech since Thanksgiving. CT head and MRI of the brain did not show any acute findings. Seen by physical therapy recommended home health PT. Echocardiogram showed normal ejection fraction small pericardial effusion was noted I discussed with cardiology and this was deemed not significant to do anything.  Patient did not have any chest pain or shortness of breath. Carotid ultrasound showed no evidence of hemodynamically significant stenosis. Lipid panel was significant for LDL of 104.  A1c is 5.5  #2 essential hypertension I have stopped her Norvasc due to soft/low blood pressure  #3 hyperlipidemia Crestor 5 mg nightly prescription was given. #4 history of anxiety and depression continue home meds.  #5 history of diverticulitis/diverticulosis/IBS-patient was complaining of spasm at night which is waking her up from sleep.  I have given her a prescription for Bentyl 10 mg twice a day.  #6 hypothyroidism normal TSH continue Synthroid.  Estimated body mass index is 22.67 kg/m as calculated from the following:   Height as of this encounter: 5\' 3"  (1.6 m).   Weight as of this encounter:  58.1 kg.  Discharge Instructions  Discharge Instructions     Call MD  for:  difficulty breathing, headache or visual disturbances   Complete by: As directed    Call MD for:  persistant dizziness or light-headedness   Complete by: As directed    Call MD for:  persistant nausea and vomiting   Complete by: As directed    Call MD for:  temperature >100.4   Complete by: As directed    Diet - low sodium heart healthy   Complete by: As directed    Increase activity slowly   Complete by: As directed       Allergies as of 03/10/2021   No Known Allergies      Medication List     STOP taking these medications    amLODipine 5 MG tablet Commonly known as: NORVASC   ketoconazole 2 % cream Commonly known as: NIZORAL   omeprazole 20 MG capsule Commonly known as: PRILOSEC       TAKE these medications    ALPRAZolam 0.25 MG tablet Commonly known as: XANAX TAKE 1 TABLET BY MOUTH 3 TIMES DAILY AS NEEDED FOR ANXIETY. **GREENSTONE** What changed:  how much to take how to take this when to take this reasons to take this additional instructions   dicyclomine 10 MG capsule Commonly known as: BENTYL Take 1 capsule (10 mg total) by mouth 2 (two) times daily.   HYDROcodone-acetaminophen 5-325 MG tablet Commonly known as: NORCO/VICODIN Take 1 tablet by mouth every 4-6 hours as needed for abdominal pain What changed:  how much to take how to take this when to take this additional instructions   levothyroxine 75 MCG tablet Commonly known as: SYNTHROID Take 75 mcg by mouth daily before breakfast.   mirtazapine 7.5 MG tablet Commonly known as: REMERON Take 1 tablet (7.5 mg total) by mouth at bedtime. What changed: how much to take   Vitamin D-3 25 MCG (1000 UT) Caps Take 1,000 Units by mouth every 7 (seven) days.        No Known Allergies  Consultations: None   Procedures/Studies: DG Chest 2 View  Result Date: 03/09/2021 CLINICAL DATA:  Cough.  Slurred speech. EXAM: CHEST - 2 VIEW COMPARISON:  02/12/2011 FINDINGS: Heart size upper  limits of normal. Aortic atherosclerotic calcification is present. The lungs are clear except for minimal atelectasis or scarring at the lung bases. No evidence of lobar consolidation or collapse. No effusion. No significant bone finding. IMPRESSION: Minimal scarring or atelectasis at the lung bases. Otherwise no active disease. Electronically Signed   By: Nelson Chimes M.D.   On: 03/09/2021 12:39   CT HEAD WO CONTRAST  Result Date: 03/09/2021 CLINICAL DATA:  Neuro deficit, acute, stroke suspected slurred speech. leaning towards left EXAM: CT HEAD WITHOUT CONTRAST TECHNIQUE: Contiguous axial images were obtained from the base of the skull through the vertex without intravenous contrast. COMPARISON:  CT head March 13, 2011. FINDINGS: Brain: No evidence of acute large vascular territory infarction, hemorrhage, hydrocephalus, extra-axial collection or mass lesion/mass effect. Vascular: No hyperdense vessel identified. Skull: No acute fracture. Sinuses/Orbits: Mild paranasal sinus mucosal thickening. Unremarkable orbits. Other: No mastoid effusions. IMPRESSION: No evidence of acute intracranial abnormality. Electronically Signed   By: Margaretha Sheffield M.D.   On: 03/09/2021 13:29   MR BRAIN WO CONTRAST  Result Date: 03/09/2021 CLINICAL DATA:  Transient ischemic attack (TIA) EXAM: MRI HEAD WITHOUT CONTRAST TECHNIQUE: Multiplanar, multiecho pulse sequences of the brain and surrounding structures were obtained without intravenous  contrast. COMPARISON:  Same day CT head.  MRI 03/13/2011. FINDINGS: Motion limited study despite multiple repeat attempts due to patient claustrophobia/motion. Within this limitation: Brain: No acute infarction, hemorrhage, hydrocephalus, extra-axial collection or mass lesion. Mild for age atrophy. Vascular: Major arterial flow voids are preserved at the skull base. Skull and upper cervical spine: Normal marrow signal. Sinuses/Orbits: Mild to moderate paranasal sinus mucosal thickening.  Unremarkable orbits Other: No mastoid effusions. IMPRESSION: No evidence of acute intracranial abnormality on this motion limited exam. Electronically Signed   By: Margaretha Sheffield M.D.   On: 03/09/2021 16:01   ECHOCARDIOGRAM COMPLETE  Result Date: 03/10/2021    ECHOCARDIOGRAM REPORT   Patient Name:   PENIEL BIEL Date of Exam: 03/10/2021 Medical Rec #:  [id number]      Height:       63.0 in Accession #:    [id number]     Weight:       128.0 lb Date of Birth:  [birth year]      BSA:          1.600 m Patient Age:    34 years       BP:           109/49 mmHg Patient Gender: F              HR:           71 bpm. Exam Location:  Inpatient Procedure: 2D Echo, Color Doppler and Cardiac Doppler Indications:    TIA  History:        Patient has prior history of Echocardiogram examinations, most                 recent 03/14/2011. Risk Factors:Hypertension and Dyslipidemia.  Sonographer:    Raquel Sarna Senior RDCS Referring Phys: [phone] [place]  Sonographer Comments: Technically difficult due to lung interference. IMPRESSIONS  1. Left ventricular ejection fraction, by estimation, is 60 to 65%. The left ventricle has normal function. The left ventricle has no regional wall motion abnormalities. Left ventricular diastolic parameters are consistent with Grade I diastolic dysfunction (impaired relaxation).  2. Right ventricular systolic function is normal. The right ventricular size is normal. Tricuspid regurgitation signal is inadequate for assessing PA pressure.  3. A small pericardial effusion is present. The pericardial effusion is anterior to the right ventricle.  4. The mitral valve is normal in structure. No evidence of mitral valve regurgitation. No evidence of mitral stenosis.  5. The aortic valve was not well visualized. Aortic valve regurgitation is trivial. No aortic stenosis is present. Comparison(s): Prior images unable to be directly viewed, comparison made by report only. No significant change from prior  study. FINDINGS  Left Ventricle: Left ventricular ejection fraction, by estimation, is 60 to 65%. The left ventricle has normal function. The left ventricle has no regional wall motion abnormalities. The left ventricular internal cavity size was normal in size. There is  no left ventricular hypertrophy. Left ventricular diastolic parameters are consistent with Grade I diastolic dysfunction (impaired relaxation). Right Ventricle: The right ventricular size is normal. No increase in right ventricular wall thickness. Right ventricular systolic function is normal. Tricuspid regurgitation signal is inadequate for assessing PA pressure. Left Atrium: Left atrial size was normal in size. Right Atrium: Right atrial size was normal in size. Pericardium: A small pericardial effusion is present. The pericardial effusion is anterior to the right ventricle. Mitral Valve: The mitral valve is normal in structure. Mild to moderate mitral annular calcification. No evidence  of mitral valve regurgitation. No evidence of mitral valve stenosis. Tricuspid Valve: The tricuspid valve is normal in structure. Tricuspid valve regurgitation is not demonstrated. No evidence of tricuspid stenosis. Aortic Valve: The aortic valve was not well visualized. Aortic valve regurgitation is trivial. Aortic regurgitation PHT measures 365 msec. No aortic stenosis is present. Pulmonic Valve: The pulmonic valve was not well visualized. Pulmonic valve regurgitation is not visualized. No evidence of pulmonic stenosis. Aorta: The aortic root and ascending aorta are structurally normal, with no evidence of dilitation. IAS/Shunts: The atrial septum is grossly normal.  LEFT VENTRICLE PLAX 2D LVIDd:         4.20 cm   Diastology LVIDs:         2.50 cm   LV e' medial:    6.20 cm/s LV PW:         0.60 cm   LV E/e' medial:  9.3 LV IVS:        0.60 cm   LV e' lateral:   7.62 cm/s LVOT diam:     1.80 cm   LV E/e' lateral: 7.6 LV SV:         56 LV SV Index:   35 LVOT Area:      2.54 cm  RIGHT VENTRICLE RV S prime:     11.40 cm/s TAPSE (M-mode): 1.9 cm LEFT ATRIUM             Index        RIGHT ATRIUM           Index LA diam:        2.20 cm 1.38 cm/m   RA Area:     10.70 cm LA Vol (A2C):   22.4 ml 14.00 ml/m  RA Volume:   21.00 ml  13.13 ml/m LA Vol (A4C):   21.0 ml 13.13 ml/m LA Biplane Vol: 21.7 ml 13.57 ml/m  AORTIC VALVE LVOT Vmax:   108.00 cm/s LVOT Vmean:  79.000 cm/s LVOT VTI:    0.221 m AI PHT:      365 msec  AORTA Ao Root diam: 2.90 cm Ao Asc diam:  3.00 cm MITRAL VALVE MV Area (PHT): 2.54 cm    SHUNTS MV Decel Time: 299 msec    Systemic VTI:  0.22 m MV E velocity: 57.80 cm/s  Systemic Diam: 1.80 cm MV A velocity: 99.80 cm/s MV E/A ratio:  0.58 Rudean Haskell MD Electronically signed by Rudean Haskell MD Signature Date/Time: 03/10/2021/12:28:24 PM    Final    VAS US CAROTID (at [facility] and [facility] only)  Result Date: 03/09/2021 Carotid Arterial Duplex Study Patient Name:  KHYRA VISCUSO  Date of Exam:   03/09/2021 Medical Rec #: [id number]       Accession #:    [id number] Date of Birth: 06/29/1934       Patient Gender: F Patient Age:   86 years Exam Location:  [facility] Procedure:      VAS US CAROTID Referring Phys: DAVID ORTIZ --------------------------------------------------------------------------------  Indications:       TIA. Risk Factors:      Hypertension. Comparison Study:  09/20/2019 - Right Carotid: Velocities in the right ICA are                    consistent with a 1-39%                    stenosis.  Left Carotid: Velocities in the left ICA are consistent with                    a 1-39%                    stenosis.                     Vertebrals: Bilateral vertebral arteries demonstrate                    antegrade flow.                    Subclavians: Normal flow hemodynamics were seen in bilateral                    subclavian                    arteries. Performing Technologist: Oliver Hum RVT  Examination Guidelines:  A complete evaluation includes B-mode imaging, spectral Doppler, color Doppler, and power Doppler as needed of all accessible portions of each vessel. Bilateral testing is considered an integral part of a complete examination. Limited examinations for reoccurring indications may be performed as noted.  Right Carotid Findings: +----------+--------+--------+--------+-----------------------+--------+           PSV cm/sEDV cm/sStenosisPlaque Description     Comments +----------+--------+--------+--------+-----------------------+--------+ CCA Prox  74      8               smooth and heterogenous         +----------+--------+--------+--------+-----------------------+--------+ CCA Distal81      13              smooth and heterogenous         +----------+--------+--------+--------+-----------------------+--------+ ICA Prox  65      18              smooth and heterogenous         +----------+--------+--------+--------+-----------------------+--------+ ICA Distal72      17                                     tortuous +----------+--------+--------+--------+-----------------------+--------+ ECA       88      6                                               +----------+--------+--------+--------+-----------------------+--------+ +----------+--------+-------+--------+-------------------+           PSV cm/sEDV cmsDescribeArm Pressure (mmHG) +----------+--------+-------+--------+-------------------+ Subclavian116                                        +----------+--------+-------+--------+-------------------+ +---------+--------+--+--------+--+---------+ VertebralPSV cm/s47EDV cm/s11Antegrade +---------+--------+--+--------+--+---------+  Left Carotid Findings: +----------+--------+--------+--------+-----------------------+--------+           PSV cm/sEDV cm/sStenosisPlaque Description     Comments  +----------+--------+--------+--------+-----------------------+--------+ CCA Prox  100     17              smooth and heterogenous         +----------+--------+--------+--------+-----------------------+--------+ CCA Distal70      14              smooth and heterogenous         +----------+--------+--------+--------+-----------------------+--------+  ICA Prox  46      15              smooth and heterogenous         +----------+--------+--------+--------+-----------------------+--------+ ICA Distal71      21                                     tortuous +----------+--------+--------+--------+-----------------------+--------+ ECA       73      9                                               +----------+--------+--------+--------+-----------------------+--------+ +----------+--------+--------+--------+-------------------+           PSV cm/sEDV cm/sDescribeArm Pressure (mmHG) +----------+--------+--------+--------+-------------------+ [id number]                                          +----------+--------+--------+--------+-------------------+ +---------+--------+--+--------+-+---------+ VertebralPSV cm/s42EDV cm/s6Antegrade +---------+--------+--+--------+-+---------+   Summary: Right Carotid: Velocities in the right ICA are consistent with a 1-39% stenosis. Left Carotid: Velocities in the left ICA are consistent with a 1-39% stenosis. Vertebrals: Bilateral vertebral arteries demonstrate antegrade flow. *See table(s) above for measurements and observations.  Electronically signed by Monica Martinez MD on 03/09/2021 at 3:53:02 PM.    Final    (Echo, Carotid, EGD, Colonoscopy, ERCP)    Subjective: Patient is resting in bed very anxious to go home denies any weakness chest pain shortness of breath headaches family by the bedside discussed with daughter in detail  Discharge Exam: Vitals:   03/10/21 0249 03/10/21 1319  BP: (!) 109/49 115/71  Pulse: 70 84   Resp: 14 16  Temp: 98.3 F (36.8 C) 98.6 F (37 C)  SpO2: 96% 98%   Vitals:   03/09/21 2100 03/09/21 2256 03/10/21 0249 03/10/21 1319  BP: (!) 106/59 (!) 105/48 (!) 109/49 115/71  Pulse: 83 77 70 84  Resp: 18  14 16   Temp: 98.2 F (36.8 C) 99.1 F (37.3 C) 98.3 F (36.8 C) 98.6 F (37 C)  TempSrc: Oral Oral Oral Oral  SpO2: 96% 93% 96% 98%  Weight:      Height:        General: [name] alert, awake, not in acute distress Cardiovascular: RRR, S1/S2 +, no rubs, no gallops Respiratory: CTA bilaterally, no wheezing, no rhonchi Abdominal: Soft, NT, ND, bowel sounds + Extremities: no edema, no cyanosis    The results of significant diagnostics from this hospitalization (including imaging, microbiology, ancillary and laboratory) are listed below for reference.     Microbiology: Recent Results (from the past 240 hour(s))  Resp Panel by RT-PCR (Flu A&B, Covid) Nasopharyngeal Swab     Status: None   Collection Time: 03/09/21  1:30 PM   Specimen: Nasopharyngeal Swab; Nasopharyngeal(NP) swabs in vial transport medium  Result Value Ref Range Status   SARS Coronavirus 2 by RT PCR NEGATIVE NEGATIVE Final    Comment: (NOTE) SARS-CoV-2 target nucleic acids are NOT DETECTED.  The SARS-CoV-2 RNA is generally detectable in upper respiratory specimens during the acute phase of infection. The lowest concentration of SARS-CoV-2 viral copies this assay can detect is 138 copies/mL. A negative result does not preclude SARS-Cov-2 infection and should not be  used as the sole basis for treatment or other patient management decisions. A negative result may occur with  improper specimen collection/handling, submission of specimen other than nasopharyngeal swab, presence of viral mutation(s) within the areas targeted by this assay, and inadequate number of viral copies(<138 copies/mL). A negative result must be combined with clinical observations, patient history, and  epidemiological information. The expected result is Negative.  Fact Sheet for Patients:  EntrepreneurPulse.com.au  Fact Sheet for Healthcare Providers:  IncredibleEmployment.be  This test is no t yet approved or cleared by the [place] FDA and  has been authorized for detection and/or diagnosis of SARS-CoV-2 by FDA under an Emergency Use Authorization (EUA). This EUA will remain  in effect (meaning this test can be used) for the duration of the COVID-19 declaration under Section 564(b)(1) of the Act, 21 U.S.C.section 360bbb-3(b)(1), unless the authorization is terminated  or revoked sooner.       Influenza A by PCR NEGATIVE NEGATIVE Final   Influenza B by PCR NEGATIVE NEGATIVE Final    Comment: (NOTE) The Xpert Xpress SARS-CoV-2/FLU/RSV plus assay is intended as an aid in the diagnosis of influenza from Nasopharyngeal swab specimens and should not be used as a sole basis for treatment. Nasal washings and aspirates are unacceptable for Xpert Xpress SARS-CoV-2/FLU/RSV testing.  Fact Sheet for Patients: EntrepreneurPulse.com.au  Fact Sheet for Healthcare Providers: IncredibleEmployment.be  This test is not yet approved or cleared by the [place] FDA and has been authorized for detection and/or diagnosis of SARS-CoV-2 by FDA under an Emergency Use Authorization (EUA). This EUA will remain in effect (meaning this test can be used) for the duration of the COVID-19 declaration under Section 564(b)(1) of the Act, 21 U.S.C. section 360bbb-3(b)(1), unless the authorization is terminated or revoked.  Performed at [facility], [place] [address], [place], [place] [address]      Labs: BNP (last 3 results) No results for input(s): BNP in the last 8760 hours. Basic Metabolic Panel: Recent Labs  Lab 03/09/21 1331 03/09/21 1341  NA 138 138  K 3.9 3.9  CL 99 100  CO2 28  --    GLUCOSE 94 91  BUN 8 7*  CREATININE 0.64 0.60  CALCIUM 9.7  --    Liver Function Tests: Recent Labs  Lab 03/09/21 1331  AST 21  ALT 18  ALKPHOS 60  BILITOT 0.6  PROT 7.8  ALBUMIN 4.3   No results for input(s): LIPASE, AMYLASE in the last 168 hours. No results for input(s): AMMONIA in the last 168 hours. CBC: Recent Labs  Lab 03/09/21 1331 03/09/21 1341  WBC 6.0  --   NEUTROABS 4.3  --   HGB 14.2 15.0  HCT 44.0 44.0  MCV 83.8  --   PLT 320  --    Cardiac Enzymes: No results for input(s): CKTOTAL, CKMB, CKMBINDEX, TROPONINI in the last 168 hours. BNP: Invalid input(s): POCBNP CBG: No results for input(s): GLUCAP in the last 168 hours. D-Dimer No results for input(s): DDIMER in the last 72 hours. Hgb A1c Recent Labs    03/10/21 0402  HGBA1C 5.5   Lipid Profile Recent Labs    03/10/21 0402  CHOL 183  HDL 71  LDLCALC 104*  TRIG 38  CHOLHDL 2.6   Thyroid function studies Recent Labs    03/09/21 1330  TSH 0.761   Anemia work up No results for input(s): VITAMINB12, FOLATE, FERRITIN, TIBC, IRON, RETICCTPCT in the last 72 hours. Urinalysis    Component  Value Date/Time   COLORURINE YELLOW 03/09/2021 1245   APPEARANCEUR HAZY (A) 03/09/2021 1245   LABSPEC 1.009 03/09/2021 1245   PHURINE 7.0 03/09/2021 1245   GLUCOSEU NEGATIVE 03/09/2021 1245   GLUCOSEU NEGATIVE 11/29/2012 1007   HGBUR NEGATIVE 03/09/2021 1245   BILIRUBINUR NEGATIVE 03/09/2021 1245   KETONESUR NEGATIVE 03/09/2021 1245   PROTEINUR NEGATIVE 03/09/2021 1245   UROBILINOGEN 0.2 05/25/2014 1332   NITRITE NEGATIVE 03/09/2021 1245   LEUKOCYTESUR LARGE (A) 03/09/2021 1245   Sepsis Labs Invalid input(s): PROCALCITONIN,  WBC,  LACTICIDVEN Microbiology Recent Results (from the past 240 hour(s))  Resp Panel by RT-PCR (Flu A&B, Covid) Nasopharyngeal Swab     Status: None   Collection Time: 03/09/21  1:30 PM   Specimen: Nasopharyngeal Swab; Nasopharyngeal(NP) swabs in vial transport medium   Result Value Ref Range Status   SARS Coronavirus 2 by RT PCR NEGATIVE NEGATIVE Final    Comment: (NOTE) SARS-CoV-2 target nucleic acids are NOT DETECTED.  The SARS-CoV-2 RNA is generally detectable in upper respiratory specimens during the acute phase of infection. The lowest concentration of SARS-CoV-2 viral copies this assay can detect is 138 copies/mL. A negative result does not preclude SARS-Cov-2 infection and should not be used as the sole basis for treatment or other patient management decisions. A negative result may occur with  improper specimen collection/handling, submission of specimen other than nasopharyngeal swab, presence of viral mutation(s) within the areas targeted by this assay, and inadequate number of viral copies(<138 copies/mL). A negative result must be combined with clinical observations, patient history, and epidemiological information. The expected result is Negative.  Fact Sheet for Patients:  EntrepreneurPulse.com.au  Fact Sheet for Healthcare Providers:  IncredibleEmployment.be  This test is no t yet approved or cleared by the [place] FDA and  has been authorized for detection and/or diagnosis of SARS-CoV-2 by FDA under an Emergency Use Authorization (EUA). This EUA will remain  in effect (meaning this test can be used) for the duration of the COVID-19 declaration under Section 564(b)(1) of the Act, 21 U.S.C.section 360bbb-3(b)(1), unless the authorization is terminated  or revoked sooner.       Influenza A by PCR NEGATIVE NEGATIVE Final   Influenza B by PCR NEGATIVE NEGATIVE Final    Comment: (NOTE) The Xpert Xpress SARS-CoV-2/FLU/RSV plus assay is intended as an aid in the diagnosis of influenza from Nasopharyngeal swab specimens and should not be used as a sole basis for treatment. Nasal washings and aspirates are unacceptable for Xpert Xpress SARS-CoV-2/FLU/RSV testing.  Fact Sheet for  Patients: EntrepreneurPulse.com.au  Fact Sheet for Healthcare Providers: IncredibleEmployment.be  This test is not yet approved or cleared by the [place] FDA and has been authorized for detection and/or diagnosis of SARS-CoV-2 by FDA under an Emergency Use Authorization (EUA). This EUA will remain in effect (meaning this test can be used) for the duration of the COVID-19 declaration under Section 564(b)(1) of the Act, 21 U.S.C. section 360bbb-3(b)(1), unless the authorization is terminated or revoked.  Performed at [facility], [place] [address] [place],  [address]      Time coordinating discharge: 38  minutes  SIGNED:   Georgette Shell, MD  [facility] 03/10/2021, 3:26 PM

## 2021-03-10 NOTE — Evaluation (Signed)
Occupational Therapy Evaluation Patient Details Name: Kara Davis MRN: [id number] DOB: Mar 04, 1935 Today's Date: 03/10/2021   History of Present Illness 86 y.o. female who was brought to the emergency department due to slurred speech since this morning and they have also observed that she has been leaning to the right side when walking since Thanksgiving. MRI negative for acute CVA but motion degraded. Noted another MRI has been ordered.  Pt with medical history significant of left ophthalmic artery aneurysm, bilateral cataract, mild dementia, depression, anxiety, diverticulosis, GERD, hyperlipidemia, hypertension, IBS, osteoporosis, history of palpitations, tubular adenoma, vitamin D deficiency   Clinical Impression   Patient is currently requiring mild Min guard assistance to supervision with ADLs including toileting, standing LE dressing, and bathing, as well as with dynamic standing activities.   Current level of function is close to patient's typical baseline with exception of continued mild standing balance deficits and decreased Left sided coordination.  Patient lives alone with 2 adult children 15-20 min away who check on pt almost daily or as much as possible. Daughter did express concerns of pt continuing to live on her own, and asking for any recommendations and resources to ensure pt safety ad lib.  Discussed medical alert button and at least twice daily calls to pt as well as physical therapy assessment.  Patient demonstrates good rehab potential, and should benefit from 1-2 more visits of skilled occupational therapy services while in acute care to maximize safety, independence and quality of life at home. ?    Recommendations for follow up therapy are one component of a multi-disciplinary discharge planning process, led by the attending physician.  Recommendations may be updated based on patient status, additional functional criteria and insurance authorization.   Follow Up  Recommendations  No OT follow up    Assistance Recommended at Discharge Intermittent Supervision/Assistance  Functional Status Assessment  Patient has had a recent decline in their functional status and demonstrates the ability to make significant improvements in function in a reasonable and predictable amount of time.  Equipment Recommendations  Other (comment) (Will defer to PT for AD assessment.)    Recommendations for Other Services PT consult     Precautions / Restrictions Precautions Precautions: Fall Restrictions Weight Bearing Restrictions: No      Mobility Bed Mobility Overal bed mobility: Modified Independent                  Transfers Overall transfer level: Needs assistance   Transfers: Sit to/from Stand;Bed to chair/wheelchair/BSC Sit to Stand: Modified independent (Device/Increase time)           General transfer comment: Pt ambulated to bathroom with supervision to [place] guard assist. Pt prefers to furniture cruise and daughter reports that his is baseline for pt.      Balance Overall balance assessment: Mild deficits observed, not formally tested                                         ADL either performed or assessed with clinical judgement   ADL Overall ADL's : At baseline                                       General ADL Comments: Pt able to demostrate Independent bed mobility, LE dressing, ambulation in room (Supervision to [employer]  guard-may improve with AD, per PT recommendations), toileting and toilet transfer at or near baseline.     Vision Baseline Vision/History: 1 Wears glasses Ability to See in Adequate Light: 0 Adequate Patient Visual Report: No change from baseline Vision Assessment?: No apparent visual deficits Additional Comments: Only wears glasses for driving.     Perception Perception Perception: Impaired Comments: Continues to have mild RT sided drift or slight lean with dynamic  ambulation. Daughter reports RT lean is much less than on admission.   Praxis Praxis Praxis: Intact    Pertinent Vitals/Pain Pain Assessment: 0-10 Pain Score: 2  Pain Location: stomach. Chronic from divcerticulitis per pt and dtr. Pain Intervention(s): Limited activity within patient's tolerance;Monitored during session;Repositioned (Assisted pt to toilet, but pt reported nothing was going to happen yet.)     Hand Dominance Right   Extremity/Trunk Assessment Upper Extremity Assessment Upper Extremity Assessment: Overall WFL for tasks assessed   Lower Extremity Assessment Lower Extremity Assessment: LLE deficits/detail LLE Deficits / Details: AROM and MMT symmetrical with RUE, however pt with decreased fine and gross motor coordination on LT UE. Pt reports this is due to being RT handed, however showed some dysmetria with finger to nose as well as impairment to LUE with rapid alternating mvoements. LLE Sensation: WNL LLE Coordination: decreased fine motor;decreased gross motor   Cervical / Trunk Assessment Cervical / Trunk Assessment: Normal   Communication Communication Communication: No difficulties   Cognition Arousal/Alertness: Awake/alert Behavior During Therapy: WFL for tasks assessed/performed Overall Cognitive Status: Within Functional Limits for tasks assessed                                 General Comments: Ortented to all but month ("November").     General Comments       Exercises     Shoulder Instructions      Home Living Family/patient expects to be discharged to:: Private residence Living Arrangements: Alone Available Help at Discharge: Family;Available PRN/intermittently Type of Home:  (2 story townhouse) Home Access: Stairs to enter [employer] of Steps: 3 Entrance Stairs-Rails: Right;Left;Can reach both Home Layout: Two level;1/2 bath on main level;Able to live on main level with bedroom/bathroom Alternate Level  Stairs-Number of Steps: full flight to bathroom with shower. Alternate Level Stairs-Rails: Right Bathroom Shower/Tub: Teacher, early years/pre:  (Comfort height)     Home Equipment: Hand held shower head;Shower seat   Additional Comments: May have her Father's old SP cane      Prior Functioning/Environment Prior Level of Function : Independent/Modified Independent;Driving             Mobility Comments: Endorsed 1 fall in past year. No injuries. ADLs Comments: Pt's daughter comes by every day or every other day and does pt's laundry as well as heavier house cleaning. Pt does drive, manages her own medications.  Per daughter, pt is stubbornly independent and resistant to leaving her townhome. Daughter has asked pt to move in with her but pt has declined.        OT Problem List: Decreased coordination;Impaired balance (sitting and/or standing)      OT Treatment/Interventions: Self-care/ADL training;Therapeutic activities;Therapeutic exercise;Neuromuscular education;DME and/or AE instruction;Patient/family education;Balance training    OT Goals(Current goals can be found in the care plan section) Acute Rehab OT Goals Patient Stated Goal: To go home and remain independent. OT Goal Formulation: With patient Time For Goal Achievement: 03/24/21 Potential to Achieve Goals:  Good ADL Goals Additional ADL Goal #1: Pt/family will be able to verbalize at least 3 LT sided coordination activities that pt will engage in at home to continue LUE rehab. Additional ADL Goal #2: Pt will engage in 10 min standing functional activities without loss of balance, in order to demonstrate improved activity tolerance and balance needed to perform ADLs safely at home.  OT Frequency:  (1-2 more visits)   Barriers to D/C: Decreased caregiver support;Inaccessible home environment          Co-evaluation              AM-PAC OT "6 Clicks" Daily Activity     Outcome Measure Help from  another person eating meals?: None Help from another person taking care of personal grooming?: None (sitting) Help from another person toileting, which includes using toliet, bedpan, or urinal?: A Little Help from another person bathing (including washing, rinsing, drying)?: A Little Help from another person to put on and taking off regular upper body clothing?: None Help from another person to put on and taking off regular lower body clothing?: A Little 6 Click Score: 21   End of Session Equipment Utilized During Treatment: Gait belt  Activity Tolerance: Patient tolerated treatment well Patient left: with family/visitor present (EOB)  OT Visit Diagnosis: Unsteadiness on feet (R26.81);History of falling (Z91.81);Other symptoms and signs involving the nervous system (R29.898)                Time: 8756-4332 OT Time Calculation (min): 37 min Charges:  OT General Charges $OT Visit: 1 Visit OT Evaluation $OT Eval Low Complexity: 1 Low OT Treatments $Self Care/Home Management : 8-22 mins  Anderson Malta, OT Acute Rehab Services Office: [phone] 03/10/2021  Julien Girt 03/10/2021, 9:33 AM

## 2021-03-10 NOTE — TOC Progression Note (Signed)
Transition of Care [facility]) - Progression Note    Patient Details  Name: HARUKA KOWALESKI MRN: [id number] Date of Birth: 05-30-1934  Transition of Care [facility]) CM/SW Contact  Leeroy Cha, RN Phone Number: 03/10/2021, 1:40 PM  Clinical Narrative:    Request for pt and aide sent to [facility].     Barriers to Discharge: Continued Medical Work up  Expected Discharge Plan and Services                                     HH Arranged: PT, Nurse's Aide [place] Agency: [place] Date [place]: 03/10/21 Time [place]: 1339 Representative spoke with at [place]: [facility]   Social Determinants of Health ([place]) Interventions    Readmission Risk Interventions No flowsheet data found.

## 2021-03-10 NOTE — Care Management CC44 (Signed)
Condition Code 44 Documentation Completed  Patient Details  Name: ANGEE GUPTON MRN: [id number] Date of Birth: October 05, 1934   Condition Code 44 given:  Yes Patient signature on Condition Code 44 notice:  Yes Documentation of 2 MD's agreement:  Yes Code 44 added to claim:  Yes    Leeroy Cha, RN 03/10/2021, 1:27 PM

## 2021-03-10 NOTE — Progress Notes (Signed)
AVS given to daughter Maudie Mercury, updated with new list medicines. Still waiting on rollator to be delivered in the room and daughter opt to come back in the morning to pick it up. She can be called at [phone] when the rollator deleivered on the floor.

## 2021-03-10 NOTE — Care Management Obs Status (Signed)
[place] NOTIFICATION   Patient Details  Name: Kara Davis MRN: [id number] Date of Birth: 06-Jul-1934   Medicare Observation Status Notification Given:  Yes    Leeroy Cha, RN 03/10/2021, 1:27 PM

## 2021-03-10 NOTE — Evaluation (Signed)
Physical Therapy Evaluation Patient Details Name: Kara Davis MRN: [id number] DOB: Jun 14, 1934 Today's Date: 03/10/2021  History of Present Illness  86 y.o. female who was brought to the emergency department due to slurred speech  and  leaning to the right side when walking since Thanksgiving. MRI negative for acute CVA.  Pt with medical history significant of left ophthalmic artery aneurysm, bilateral cataract, mild dementia, depression, anxiety, diverticulosis, GERD, hyperlipidemia, hypertension, IBS, osteoporosis, history of palpitations, tubular adenoma, vitamin D deficiency  Clinical Impression  Pt admitted with above diagnosis. Pt ambulated 150' with RW and 150' with rollator, she prefers the rollator. At baseline she ambulates without an assistive device but frequently reaches for furniture and walls for support. PT advised pt to use a rollator when ambulating to minimize falls risk, and to have supervision when using her flight of stairs. She is able to live on her first floor, but would need to go up a flight to use a shower. Daughter present for session. Recommended a transfer tub bench for upstairs shower. No deficits noted with BLE strength, sensation, coordination. Daughter reports symptoms pt presented with have resolved.  Pt currently with functional limitations due to the deficits listed below (see PT Problem List). Pt will benefit from skilled PT to increase their independence and safety with mobility to allow discharge to the venue listed below.          Recommendations for follow up therapy are one component of a multi-disciplinary discharge planning process, led by the attending physician.  Recommendations may be updated based on patient status, additional functional criteria and insurance authorization.  Follow Up Recommendations Home health PT    Assistance Recommended at Discharge Intermittent Supervision/Assistance  Functional Status Assessment Patient has had a recent  decline in their functional status and demonstrates the ability to make significant improvements in function in a reasonable and predictable amount of time.  Equipment Recommendations  Rollator (4 wheels)    Recommendations for Other Services       Precautions / Restrictions Precautions Precautions: Fall Precaution Comments: 1 fall in past 6 months Restrictions Weight Bearing Restrictions: No      Mobility  Bed Mobility Overal bed mobility: Modified Independent                  Transfers Overall transfer level: Needs assistance   Transfers: Sit to/from Stand Sit to Stand: Supervision           General transfer comment: VCs for hand placement    Ambulation/Gait Ambulation/Gait assistance: Supervision Gait Distance (Feet): 300 Feet Assistive device: Rolling walker (2 wheels);Rollator (4 wheels) Gait Pattern/deviations: Step-through pattern;Decreased stride length Gait velocity: decr     General Gait Details: 150' with RW then 150' with rollator; pt prefers rollator; "furniture walks" at baseline  Stairs Stairs: Yes Stairs assistance: Min guard Stair Management: One rail Right;Step to pattern;Forwards Number of Stairs: 9 General stair comments: pt chose to use step to pattern, daughter reports this is baseline, both hands on R rail, no loss of balance  Wheelchair Mobility    Modified Rankin (Stroke Patients Only)       Balance Overall balance assessment: Mild deficits observed, not formally tested;Needs assistance   Sitting balance-Leahy Scale: Good       Standing balance-Leahy Scale: Good               High level balance activites: Head turns;Turns High Level Balance Comments: steady with 360* turn in standing, and with head turns while  walking with RW             Pertinent Vitals/Pain Pain Assessment: No/denies pain    Home Living Family/patient expects to be discharged to:: Private residence Living Arrangements:  Alone Available Help at Discharge: Family;Available PRN/intermittently Type of Home: House (2 story townhouse) Home Access: Stairs to enter Entrance Stairs-Rails: Right;Left;Can reach both Entrance Stairs-Number of Steps: 3 Alternate Level Stairs-Number of Steps: full flight to bathroom with shower. Home Layout: Two level;1/2 bath on main level;Able to live on main level with bedroom/bathroom Home Equipment: Hand held shower head;Shower seat Additional Comments: May have her Father's old SP cane    Prior Function Prior Level of Function : Independent/Modified Independent;Driving             Mobility Comments: Endorsed 1 fall in past year. No injuries. ADLs Comments: Pt's daughter comes by every day or every other day and does pt's laundry as well as heavier house cleaning. Pt does drive, manages her own medications.  Per daughter, pt is stubbornly independent and resistant to leaving her townhome. Daughter has asked pt to move in with her but pt has declined.     Hand Dominance   Dominant Hand: Right    Extremity/Trunk Assessment   Upper Extremity Assessment Upper Extremity Assessment: Defer to OT evaluation    Lower Extremity Assessment Lower Extremity Assessment: Overall WFL for tasks assessed RLE Deficits / Details: BLE strength 5/5, coordination intact, sensation to light touch intact RLE Sensation: WNL RLE Coordination: WNL LLE Deficits / Details: BLE strength 5/5, coordination intact, sensation to light touch intact LLE Sensation: WNL LLE Coordination: WNL    Cervical / Trunk Assessment Cervical / Trunk Assessment: Normal  Communication   Communication: No difficulties  Cognition Arousal/Alertness: Awake/alert Behavior During Therapy: WFL for tasks assessed/performed Overall Cognitive Status: Within Functional Limits for tasks assessed                                 General Comments: Ortented to all but month ("November").        General  Comments      Exercises     Assessment/Plan    PT Assessment Patient needs continued PT services  PT Problem List Decreased balance;Decreased mobility;Decreased knowledge of use of DME       PT Treatment Interventions Gait training;Functional mobility training;Therapeutic exercise;Balance training;Patient/family education    PT Goals (Current goals can be found in the Care Plan section)  Acute Rehab PT Goals Patient Stated Goal: to go home PT Goal Formulation: With patient/family Time For Goal Achievement: 03/24/21 Potential to Achieve Goals: Good    Frequency Min 3X/week   Barriers to discharge        Co-evaluation               AM-PAC PT "6 Clicks" Mobility  Outcome Measure Help needed turning from your back to your side while in a flat bed without using bedrails?: None Help needed moving from lying on your back to sitting on the side of a flat bed without using bedrails?: None Help needed moving to and from a bed to a chair (including a wheelchair)?: A Little Help needed standing up from a chair using your arms (e.g., wheelchair or bedside chair)?: None Help needed to walk in hospital room?: A Little Help needed climbing 3-5 steps with a railing? : A Little 6 Click Score: 21    End of Session  Equipment Utilized During Treatment: Gait belt Activity Tolerance: Patient tolerated treatment well Patient left: in chair;with call bell/phone within reach;with chair alarm set;with family/visitor present Nurse Communication: Mobility status PT Visit Diagnosis: Difficulty in walking, not elsewhere classified (R26.2)    Time: 4098-1191 PT Time Calculation (min) (ACUTE ONLY): 50 min   Charges:   PT Evaluation $PT Eval Moderate Complexity: 1 Mod PT Treatments $Gait Training: 8-22 mins $Therapeutic Activity: 8-22 mins       Blondell Reveal Kistler PT 03/10/2021  Acute Rehabilitation Services Pager [phone] Office [phone]

## 2021-03-12 ENCOUNTER — Other Ambulatory Visit: Payer: Self-pay

## 2021-03-12 ENCOUNTER — Other Ambulatory Visit: Payer: Medicare Other | Admitting: *Deleted

## 2021-03-12 ENCOUNTER — Other Ambulatory Visit: Payer: Medicare Other

## 2021-03-12 ENCOUNTER — Ambulatory Visit: Payer: Medicare Other | Admitting: Podiatry

## 2021-03-12 ENCOUNTER — Encounter: Payer: Self-pay | Admitting: Podiatry

## 2021-03-12 VITALS — BP 153/82 | HR 88 | Temp 97.9°F | Resp 16

## 2021-03-12 DIAGNOSIS — M79675 Pain in left toe(s): Secondary | ICD-10-CM

## 2021-03-12 DIAGNOSIS — M79674 Pain in right toe(s): Secondary | ICD-10-CM

## 2021-03-12 DIAGNOSIS — Z515 Encounter for palliative care: Secondary | ICD-10-CM

## 2021-03-12 DIAGNOSIS — B351 Tinea unguium: Secondary | ICD-10-CM | POA: Diagnosis not present

## 2021-03-12 NOTE — Progress Notes (Signed)
COMMUNITY PALLIATIVE CARE SW NOTE  PATIENT NAME: Kara Davis DOB: 12-30-1934 MRN: [id number]  PRIMARY CARE PROVIDER: Ginger Organ., MD  RESPONSIBLE PARTY:  Acct ID - Guarantor Home Phone Work Phone Relationship Acct Type  [id number] KARUNA, [phone] Self P/F     [place], [place] [phone]     PLAN OF CARE and INTERVENTIONS:             GOALS OF CARE/ ADVANCE CARE PLANNING:  Goal is for patient to remain in her home. Patient is a DNR, form in the home.  SOCIAL/EMOTIONAL/SPIRITUAL ASSESSMENT/ INTERVENTIONS:  SW and RN- M. Nadara Mustard completed an initial visit with patient at her home. She was present with her daughter-Kimberly. Education was provided regarding palliative care services and both patient and daughter verbalized consent to ongoing services. Patient is alert and oriented x3, but she was repetitive in her verbalizations and some forgetfulness and anxious noted. Patient asked the team several times "Did I pass the test?". Patient appeared to be disshelved in her appearance. Patient was hospitalized Monday and Tuesday due to changes in her condition due to a suspected stroke. Today, patient reported that she was having pain to her stomach, in which she has medication for, but she declines to take. Patient reported, "I want to be alert and I don't like taking medication during the day". Patient is scheduled to see the podiatrist today for a cramp in her toe. Patient walks independently by holding on the the wall and furniture. She has new rollator walker, but only uses it when she goes out. Patient has an upstairs, but her children have advised her not to go upstairs. Instead that have a bed set up for her in the living room. Patient does her own personal care, but could needs encouragement and reminders to do so. Patient managers her medications, but has misplaced several of her medications, that the daughter is planning to refill.  Palliative RN recommended to patient and her daughter to utilize a pil box to manage her medications. Patient's appetite is good despite an 8 ln weight loss in the last month. The team toured patient's home and made some safety recommendations to her daughter such as removing loose rugs for safety, throw out old food and consider installing a camera for additional safety. Patient's son spends a few nights with her also and her daughter checks in with patient daily. Patient enjoys going out with her daughter.  Patient is to start PT, OT and nursing visits following an assessment on Friday. SW provided education/resources to patient and her daughter regarding senior day programs or possibly hiring someone to assist patient-patient was resistant to this stating she was not interested. Patient has a POA/HCPOA, Living Will and DNR. Patient was born and raised in [place], [place].  She is widowed and mother of two. Patient worked as a Art therapist. Patient and her daughter remain open to ongoing palliative visits/support.  PATIENT/CAREGIVER EDUCATION/ COPING:  Patient appears to be forgetful. She and her daughter appeared to be receptive to education regarding palliative care services and interventions offered.  PERSONAL EMERGENCY PLAN:  911 can be activated for emergencies. Patient has daily contact with her children.  COMMUNITY RESOURCES COORDINATION/ HEALTH CARE NAVIGATION:  Patient to have physical and occupational therapy.  FINANCIAL/LEGAL CONCERNS/INTERVENTIONS:  None.     SOCIAL HX:  Social History   Tobacco Use   Smoking status: Never   Smokeless tobacco: Never  Substance Use  Topics   Alcohol use: Yes    Alcohol/week: 0.0 standard drinks    Comment: very rare    CODE STATUS: DNR ADVANCED DIRECTIVES: Yes MOST FORM COMPLETE:  No HOSPICE EDUCATION PROVIDED: No  PPS: Patient is alert and oriented x3 with some forgetfulness and repetitive. She ambulates independently by holding on to the walls  and furniture.   Duration of visit and documentation: 60 minutes.  [address] [place], [place]

## 2021-03-13 NOTE — ED Provider Notes (Signed)
[facility] Provider Note   CSN: [id number] Arrival date & time: 03/09/21  1136     History Chief Complaint  Patient presents with   Cough   Aphasia    Kara Davis is a 86 y.o. female.  Patient presents the ER by family chief concern of possible stroke.  Reportedly had an unsteady gait for the past 2 to 3 weeks.  This has not improved but has been persistent, however this morning they noted she had slurred speech word finding difficulty.  Confused.  Word finding difficulty and confusion lasted for about 15 to 30 minutes and has continued to improve.  No reports of fall no reports of headache or chest pain or abdominal pain no fever cough no vomiting or diarrhea reported.      Past Medical History:  Diagnosis Date   Aneurysm ([place])    over left eye   Cataracts, bilateral    Dementia ([place])    DEPRESSION 12/06/2006   Dr. Clovis Pu   DIVERTICULOSIS, COLON 12/06/2006   GERD 12/06/2006   HYPERCHOLESTEROLEMIA 07/27/2007   HYPERTENSION 07/27/2007   Irritable bowel syndrome 12/06/2006   Osteoporosis    rx refused   Palpitations 05/23/2008   Tubular adenoma 09/09/2009   VITAMIN D DEFICIENCY 05/23/2008    Patient Active Problem List   Diagnosis Date Noted   Slurred speech 03/09/2021   Conductive hearing loss of both ears 09/17/2015   Dysfunction of both eustachian tubes 09/17/2015   Belching 04/20/2013   Nausea alone 11/02/2012   Unspecified constipation 11/02/2012   Dysuria 11/02/2012   Pyuria 03/20/2012   Knee pain 02/25/2012   Disturbance of skin sensation 02/25/2012   Aneurysm ([place]) 10/01/2011   Hyposmolality and/or hyponatremia 03/19/2011   Anemia, unspecified 03/19/2011   Aneurysm, 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/15/2011   UTI (lower urinary tract infection) 03/15/2011   Hypothyroidism 03/13/2011   Abdominal pain, other specified site 10/12/2010   Routine general medical examination at a health care facility 08/19/2010   Encounter  for long-term (current) use of other medications 08/19/2010   Hematuria 08/19/2010   Numbness 08/19/2010   Unspecified disorder of liver 08/19/2010   UTI 03/31/2010   CHILLS WITHOUT FEVER 03/31/2010   ABDOMINAL PAIN, PERIUMBILICAL 89/38/1017   VITAMIN D DEFICIENCY 05/23/2008   Palpitations 05/23/2008   HYPERCHOLESTEROLEMIA 07/27/2007   Essential hypertension 07/27/2007   Anxiety with depression 12/06/2006   GERD 12/06/2006   DYSPEPSIA 12/06/2006   Diverticulosis of colon 12/06/2006   Irritable bowel syndrome 12/06/2006   ABDOMINAL PAIN, CHRONIC 12/06/2006   HYPERGLYCEMIA 12/06/2006    Past Surgical History:  Procedure Laterality Date   APPENDECTOMY     CATARACT EXTRACTION     EXPLORATORY LAPAROTOMY  1993   secondary to abd. pain   thumb surgery     for fracture     OB History   No obstetric history on file.     Family History  Problem Relation Age of Onset   Heart disease Mother    Diabetes Brother    Prostate cancer Brother    Colon cancer Neg Hx     Social History   Tobacco Use   Smoking status: Never   Smokeless tobacco: Never  Vaping Use   Vaping Use: Never used  Substance Use Topics   Alcohol use: Yes    Alcohol/week: 0.0 standard drinks    Comment: very rare   Drug use: No    Home Medications Prior to Admission medications  Medication Sig Start Date End Date Taking? Authorizing Provider  ALPRAZolam (XANAX) 0.25 MG tablet TAKE 1 TABLET BY MOUTH 3 TIMES DAILY AS NEEDED FOR ANXIETY. **GREENSTONE** Patient taking differently: Take 0.25 mg by mouth at bedtime as needed for sleep (GREENSTONE BRAND ONLY). 11/11/20  Yes Cottle, Billey Co., MD  Cholecalciferol (VITAMIN D-3) 25 MCG (1000 UT) CAPS Take 1,000 Units by mouth every 7 (seven) days.   Yes [provider]  dicyclomine (BENTYL) 10 MG capsule Take 1 capsule (10 mg total) by mouth 2 (two) times daily. 03/10/21  Yes Georgette Shell, MD  HYDROcodone-acetaminophen (NORCO/VICODIN) 5-325 MG  tablet Take 1 tablet by mouth every 4-6 hours as needed for abdominal pain Patient taking differently: Take 0.125-0.25 tablets by mouth See admin instructions. Take 0.125-0.25 tablet by mouth at bedtime- EVERY OTHER NIGHT 03/14/15  Yes Mauri Pole, MD  levothyroxine (SYNTHROID) 75 MCG tablet Take 75 mcg by mouth daily before breakfast. 11/06/18  Yes [provider]  mirtazapine (REMERON) 7.5 MG tablet Take 1 tablet (7.5 mg total) by mouth at bedtime. Patient taking differently: Take 1.875 mg by mouth at bedtime. 11/11/20  Yes Cottle, Billey Co., MD  rosuvastatin (CRESTOR) 5 MG tablet Take 1 tablet (5 mg total) by mouth daily. 03/10/21 03/10/22 Yes Georgette Shell, MD    Allergies    Patient has no known allergies.  Review of Systems   Review of Systems  Constitutional:  Negative for fever.  HENT:  Negative for ear pain.   Eyes:  Negative for pain.  Respiratory:  Negative for cough.   Cardiovascular:  Negative for chest pain.  Gastrointestinal:  Negative for abdominal pain.  Genitourinary:  Negative for flank pain.  Musculoskeletal:  Negative for back pain.  Skin:  Negative for rash.  Neurological:  Negative for headaches.   Physical Exam Updated Vital Signs BP 115/71 (BP Location: Right Arm)   Pulse 84   Temp 98.6 F (37 C) (Oral)   Resp 16   Ht 5\' 3"  (1.6 m)   Wt 58.1 kg   SpO2 98%   BMI 22.67 kg/m   Physical Exam Constitutional:      General: She is not in acute distress.    Appearance: Normal appearance.  HENT:     Head: Normocephalic.     Nose: Nose normal.  Eyes:     Extraocular Movements: Extraocular movements intact.  Cardiovascular:     Rate and Rhythm: Normal rate.  Pulmonary:     Effort: Pulmonary effort is normal.  Musculoskeletal:        General: Normal range of motion.     Cervical back: Normal range of motion.  Neurological:     General: No focal deficit present.     Mental Status: She is alert and oriented to person, place, and  time. Mental status is at baseline.     Cranial Nerves: No cranial nerve deficit.     Motor: No weakness.    ED Results / Procedures / Treatments   Labs (all labs ordered are listed, but only abnormal results are displayed) Labs Reviewed  CBC - Abnormal; Notable for the following components:      Result Value   RBC 5.25 (*)    All other components within normal limits  RAPID URINE DRUG SCREEN, HOSP PERFORMED - Abnormal; Notable for the following components:   Benzodiazepines POSITIVE (*)    All other components within normal limits  URINALYSIS, ROUTINE W REFLEX MICROSCOPIC - Abnormal;  Notable for the following components:   APPearance HAZY (*)    Leukocytes,Ua LARGE (*)    Bacteria, UA RARE (*)    All other components within normal limits  LIPID PANEL - Abnormal; Notable for the following components:   LDL Cholesterol 104 (*)    All other components within normal limits  I-STAT CHEM 8, ED - Abnormal; Notable for the following components:   BUN 7 (*)    All other components within normal limits  RESP PANEL BY RT-PCR (FLU A&B, COVID) ARPGX2  ETHANOL  PROTIME-INR  APTT  DIFFERENTIAL  COMPREHENSIVE METABOLIC PANEL  HEMOGLOBIN A1C  TSH    EKG EKG Interpretation  Date/Time:  Monday March 09 2021 13:37:30 EST Ventricular Rate:  81 PR Interval:  172 QRS Duration: 85 QT Interval:  367 QTC Calculation: 426 R Axis:   -7 Text Interpretation: Sinus rhythm Atrial premature complex Abnormal R-wave progression, early transition LVH by voltage Confirmed by Veryl Speak [phone]) on 03/10/2021 10:15:43 PM  Radiology No results found.  Procedures Procedures   Medications Ordered in ED Medications   stroke: mapping our early stages of recovery book ( Does not apply Given 03/09/21 1611)  LORazepam (ATIVAN) injection 0.5 mg (0.5 mg Intravenous Given 03/09/21 1519)    ED Course  I have reviewed the triage vital signs and the nursing notes.  Pertinent labs & imaging results  that were available during my care of the patient were reviewed by me and considered in my medical decision making (see chart for details).    MDM Rules/Calculators/A&P                           No focal neurodeficit seen at this time.  Given history concern for possible TIA.  Imaging unremarkable.  Labs unremarkable.  Admitted to the hospitalist team for history concerning for TIA.  Final Clinical Impression(s) / ED Diagnoses Final diagnoses:  TIA (transient ischemic attack)    Rx / DC Orders ED Discharge Orders          Ordered    dicyclomine (BENTYL) 10 MG capsule  2 times daily        03/10/21 1442    Increase activity slowly        03/10/21 1442    Diet - low sodium heart healthy        03/10/21 1442    Call MD for:  persistant nausea and vomiting        03/10/21 1442    Call MD for:  temperature >100.4        03/10/21 1442    Call MD for:  persistant dizziness or light-headedness        03/10/21 1442    Call MD for:  difficulty breathing, headache or visual disturbances        03/10/21 1442    rosuvastatin (CRESTOR) 5 MG tablet  Daily        03/10/21 1534             Luna Fuse, MD 03/13/21 1622

## 2021-03-13 NOTE — Progress Notes (Signed)
Subjective:   Patient ID: Kara Davis, female   DOB: 86 y.o.   MRN: [id number]   HPI Patient presents with elongated nailbeds 1-5 both feet that are thick yellow and brittle and hard for her to cut and they are concerned about lesions that they wanted checked   ROS      Objective:  Physical Exam  Neurovascular status intact with thick yellow brittle nailbeds 1-5 both feet with no apparent lesions     Assessment:  Chronic mycotic nail infection 1-5 both feet with pain     Plan:  Debrided all nailbeds today no iatrogenic bleeding can be repeated as needed do not see anything else to do currently     Patient presents with

## 2021-03-16 NOTE — Progress Notes (Signed)
[facility] RN NOTE  PATIENT NAME: Kara Davis DOB: 09-27-1934 MRN: [id number]  PRIMARY CARE PROVIDER: Ginger Organ., MD  RESPONSIBLE PARTY: [name] (daughter) Acct ID - Guarantor Home Phone Work Phone Relationship Acct Type  [id number] MADIGAN, [phone] Self P/F     [address] [place], [place] [address]   Covid-19 Pre-screening Negative  PLAN OF CARE and INTERVENTION:  ADVANCE CARE PLANNING/GOALS OF CARE: Goal is for patient to remain in her home for as long as possible. She has a DNR. PATIENT/CAREGIVER EDUCATION: Explained palliative care services, symptom management, pain management, safe mobility, s/s of infection DISEASE STATUS: Joint initial palliative care visit made with LCSW, M. Lonon. Met with patient and her daughter, Kara Davis, in patient's home. Patient is intermittently confused and forgetful and makes repetitive statements. Her mood is pleasant. She is able to engage in conversation, follow commands and make her needs known. She reports ongoing issues with abdominal pain. Kara Davis states that she has recently been prescribed Bentyl and she will pick this up from the pharmacy today. She currently takes 1/4 of a Hydrocodone tablet usually at bedtime. She says that it helps but will not take any more than this. She feels that by staying busy it keeps her mind off of the pain. Respirations are even, regular and unlabored. She has an occasional mild non-productive cough. She is ambulatory and has a rollator walker, but does not always utilize this. She mainly holds onto furniture and walls. She does not go upstairs unless someone assists her. She requires assistance with bathing and dressing. Kara Davis says that often times patient does not bathe. Her appetite is good. Kara Davis says she has lost about 8 lbs over the past 6 months but overall feels she eats well. Patient manages her medications, but daughter is unsure if she is taking  her medications properly. She has tried pill boxes in the past and patient would dump them out. I recommended to try doing another pill box and explained to patient how this could be more helpful. Another concern is that patient does not like for anyone to throw food out and she eats some foods that are expired. Advised that this also could be contributing to her abdominal pain. Daughter had to take out a cup of coffee that appeared to have thickened significantly so is unsure as to how long it had been in there. Patient had a recent brief hospitalization from 03/09/21 to 03/10/21 due to slurred speech and leaning significantly to the right. She was found to have a TIA. This has resolved and she was walking/sitting upright without difficulty. She has PT/OT coming this Friday for an evaluation and will also have a Therapist, sports. She had a swallow evaluation while in the hospital and did well without issues. She is sleeping ok at night. She takes Xanax. They have a follow-up appointment with Dr. Brigitte Pulse next week. The hospital discontinued her Norvasc so daughter will discuss whether this is needed or not with her PCP. She has an appointment with Podiatry today. They are agreeable to future visits with palliative care.   HISTORY OF PRESENT ILLNESS: This is a 86 yo female with a history of ophthalmic artery aneurysm, essential hypertension, GERD, IBS, hypothyroidism and anxiety with depression. Palliative care team has been asked to follow patient for additional support, goals of care and complex decision making.  CODE STATUS: DNR ADVANCED DIRECTIVES: Y MOST FORM: no PPS: 60%   PHYSICAL EXAM:  VITALS: Today's Vitals   03/12/21 1040  BP: (!) 153/82  Pulse: 88  Resp: 16  Temp: 97.9 F (36.6 C)  TempSrc: Temporal  SpO2: 99%  PainSc: 2   PainLoc: Abdomen    LUNGS: clear to auscultation  CARDIAC: Cor RRR EXTREMITIES: No edema SKIN:  Exposed skin is dry and intact; denies any skin issues   NEURO:  Alert and  oriented x 3, intermittent confusion, forgetful and repetitive, generalized weakness, ambulatory and has rollator available   (Duration of visit and documentation 75 minutes)    Daryl Eastern, RN BSN

## 2021-04-02 ENCOUNTER — Telehealth: Payer: Self-pay

## 2021-04-02 DIAGNOSIS — Z515 Encounter for palliative care: Secondary | ICD-10-CM

## 2021-04-02 NOTE — Telephone Encounter (Signed)
(  1:31 pm)  Palliative care SW returned a call to patient's daughter-Kimberly. Joelene Millin advised that patient has been staying at her home for the past two weeks. Patient has had increased pain and in her intake has declined overall. Patient's neurologist recommended that patient take a whole hydrocodone, instead of the half she was taking. Once patient took the whole tablet, she became sick and vomited. Joelene Millin expressed that she is not sure what to do at this point.  She had questions regarding placement and medicaid. SW provided an elder Environmental education officer and encouraged her to call and discuss estate planning with her for patient.  SW provided education, support and resources, normalization of feelings and scheduled a follow-up appointment with the palliative care NP-Karen Highfill for 04/14/21 @ 2 pm.  No other concerns noted.

## 2021-04-14 ENCOUNTER — Other Ambulatory Visit: Payer: Self-pay

## 2021-04-14 ENCOUNTER — Other Ambulatory Visit: Payer: Medicare HMO | Admitting: Family Medicine

## 2021-04-14 DIAGNOSIS — G459 Transient cerebral ischemic attack, unspecified: Secondary | ICD-10-CM | POA: Diagnosis not present

## 2021-04-14 DIAGNOSIS — R1030 Lower abdominal pain, unspecified: Secondary | ICD-10-CM

## 2021-04-14 DIAGNOSIS — Z515 Encounter for palliative care: Secondary | ICD-10-CM | POA: Diagnosis not present

## 2021-04-14 DIAGNOSIS — R4189 Other symptoms and signs involving cognitive functions and awareness: Secondary | ICD-10-CM | POA: Diagnosis not present

## 2021-04-14 NOTE — Progress Notes (Signed)
[facility] Consult Note Telephone: [phone]  Fax: [phone]    Date of encounter: 04/14/21 2:09 PM PATIENT NAME: Kara Davis [id number]   [phone] (home) [phone] (work) DOB: [birth year] MRN: [id number] PRIMARY CARE PROVIDER:    Ginger Davis., MD,  [place] [address] [phone]  REFERRING PROVIDER:   Ginger Davis., MD [place],  [place] [address] [phone]  RESPONSIBLE PARTY:    Contact Information     Name Relation Home Work Mobile   Kara Davis Daughter [phone]         I met face to face with patient and her daughter in daughter's home at [place], [place]. Palliative Care was asked to follow this patient by consultation request of  Kara Davis., MD to address advance care planning and complex medical decision making. This is a follow up visit.                                   ASSESSMENT, SYMPTOM MANAGEMENT AND PLAN / RECOMMENDATIONS:  Cognitive Impairment-likely has some degree of cognitive impairment affecting independence of ADLs and may be Alzheimers vs vascular dementia. Continue to monitor for safety and consider possible in-home video surveillance. Recommend MMSE at next follow up.  Pt requires assistance.  Recommended she stop driving. Close monitoring and planning with caregiver.  Recommended life alert button and potential home surveillance monitor if pt to remain in her own home independently for now.  Advised pt may be able to remain in home for a little while longer but will likely need 24/7 supervision in the near future. TIA-stable at baseline. Lipids with only mild elevation of LDL at 104 and high HDL.  Continue dietary management. Given age and likely cognitive impairment, believe that statin not indicated. Abdominal pain of uncertain etiology.  Has had extensive work  up, not related to food intake or lack of, elimination.  Has hx of abdominal surgery and may be related to adhesions.  Encourage scheduled Tylenol on pain days for less severe pain and continue Hydrocodone Acetaminophen as prescribed for more severe pain. 4.  Palliative Care Encounter-see below     Follow up Palliative Care Visit: Palliative care will continue to follow for complex medical decision making, advance care planning, and clarification of goals. Return 4 weeks or prn.    This visit was coded based on medical decision making (MDM).  PPS: 50%  HOSPICE ELIGIBILITY/DIAGNOSIS: TBD  Chief Complaint: New Kara Davis is following pt to help with symptom management, advance directive and goals of care planning following recent hospitalization for TIA.  HISTORY OF PRESENT ILLNESS:  Kara Davis is a 87 y.o. year old female  with recent hospitalization in December, presented with pt leaning to the right and difficulty with speech.  She was thoroughly evaluated at hospital and was shortly back to normal with no abnormals on any testing and diagnosed with TIA.  Daughter states she has fallen twice since the hospitalization from a chair, no injury.  No visual changes.  She has a rollator and was strongly encouraged by PT both inpatient and since she has been home to use her rollator but is resistive to using it and states she would rather have it without the wheels. Pt dresses independently with daughter assistance to  tie shoes, makes her own breakfast with oatmeal and fruit, does not like meat but does eat dairy, nuts and beans. Likes to go to bed at 9 pm and get up early, reports never getting good sleep.  Denies coughing or choking after eating or drinking.  Daughter describes 12 lbs weight loss since June from 138 to 122 lbs.  Urinates a lot, some urinary urge incontinence. Biggest c/o is a waxing/waning lower abdominal pain that wakes her up out of sleep.  Denies intake,  type or intake or lack of intake to change the timing and character of the pain.  Pain will be particularly bothersome for about 24 hours, does not change based on position and is not affected by elimination.  Has some diverrticular disease. She states pain is more bearable with Hydrocodone which she tries to portion out breaking into halves and quarters.   Daughter states pt has gotten lost multiple times while driving and she has GPS tracker for her phone.  Has not left the stove on nor wandered from home.  Refuses to bathe stating that she takes a "bird bath" but has seat in shower.  Pt states if she had a fire she would leave the home immediately but does not identify call to 911.  If she had accidentally cut herself and was bleeding she would apply pressure and call a neighbor or her daughter.  Daughter plans to get life alert for pt and states mother wants to go home and stay in her own home at times alone. Although pt is willing to stay with daughter at times and has son that sometimes stays with her there is no 24/7 supervision at her home currently.     History obtained from review of EMR, discussion with primary team, and interview with family, facility staff/caregiver and/or Kara Davis.  I reviewed available labs, medications, imaging, studies and related documents from the EMR.  Records reviewed and summarized above.   ROS  General: NAD EYES: denies vision changes ENMT: denies dysphagia Cardiovascular: denies chest pain, denies DOE Pulmonary: denies cough, denies increased SOB Abdomen: endorses good appetite, denies constipation, endorses continence of bowel GU: denies dysuria, endorses continence of urine MSK:  denies increased weakness,  no falls reported Skin: denies rashes or wounds Neurological: denies pain, denies insomnia Psych: Endorses positive mood Heme/lymph/immuno: denies bruises, abnormal bleeding  Physical Exam: Current and past weights: Constitutional: NAD General:   thin  EYES: anicteric sclera, lids intact, no discharge  ENMT: intact hearing, oral mucous membranes moist, dentition in varying stages of repair CV: S1S2, RRR, no LE edema Pulmonary: CTAB, no increased work of breathing, no cough, room air Abdomen:  normo-active BS + 4 quadrants, soft and non tender, no ascites GU: deferred MSK: Some loss of muscle mass in extremities, moves all extremities, ambulatory.  Equal strength in all 4 extremities Skin: warm and dry, no rashes or wounds on visible skin Neuro:  no generalized weakness Psych: mild anxious affect, A and O x 3 Hem/lymph/immuno: no widespread bruising  -------------------------------------------------------------------------------------------------------------------------------------------------------------------------------------------------------------------- Palliative Care Encounter-Discussed MOST, completed new DNR.  Patient and family to review MOST, will address at future visit if required. All diagnoses for visit are included for the Palliative Care Encounter.  Advance Care Planning/Goals of Care: Goals include to maximize quality of life and symptom management. Patient and health care surrogate gave their permission to discuss. Our advance care planning conversation included a discussion about:    The value and importance of advance care planning  Experiences with loved ones who have been seriously ill or have died  Exploration of personal, cultural or spiritual beliefs that might influence medical decisions-strong faith in God and that God's timing/plan for her life and death should be the determining factor in decisions on her care. Exploration of goals of care in the event of a sudden injury or illness-has DNR but not at daughter's home, completed new DNR. Identification of a healthcare agent  Review of MOST as advance directive document.  Left blank copy for pt and daughter to review and discuss. Decision not to resuscitate  or to de-escalate disease focused treatments due to poor prognosis.  CODE STATUS: Do not resuscitate (DNR)  I spent 18 minutes (3 pm - 3:18 pm) providing this consultation. More than 50% of the time in this consultation was spent in counseling and care coordination. --------------------------------------------------------------------------------------------------------------------------------------------------------------------------------------------------------------------  Thank you for the opportunity to participate in the care of Ms. Gruszka.  The palliative care team will continue to follow. Please call our office at [phone] if we can be of additional assistance.   Marijo Conception, FNP -C  COVID-19 PATIENT SCREENING TOOL Asked and negative response unless otherwise noted:   Have you had symptoms of covid, tested positive or been in contact with someone with symptoms/positive test in the past 5-10 days?  no

## 2021-05-16 ENCOUNTER — Other Ambulatory Visit: Payer: Self-pay | Admitting: Psychiatry

## 2021-05-16 DIAGNOSIS — F411 Generalized anxiety disorder: Secondary | ICD-10-CM

## 2021-05-16 DIAGNOSIS — F5105 Insomnia due to other mental disorder: Secondary | ICD-10-CM

## 2021-05-26 ENCOUNTER — Other Ambulatory Visit: Payer: Medicare HMO | Admitting: Family Medicine

## 2021-05-26 ENCOUNTER — Other Ambulatory Visit: Payer: Self-pay

## 2021-05-26 ENCOUNTER — Encounter: Payer: Self-pay | Admitting: Family Medicine

## 2021-05-26 VITALS — BP 108/56 | HR 87 | Temp 96.1°F | Resp 16

## 2021-05-26 DIAGNOSIS — R4189 Other symptoms and signs involving cognitive functions and awareness: Secondary | ICD-10-CM | POA: Diagnosis not present

## 2021-05-26 DIAGNOSIS — I10 Essential (primary) hypertension: Secondary | ICD-10-CM

## 2021-05-26 DIAGNOSIS — Z515 Encounter for palliative care: Secondary | ICD-10-CM | POA: Diagnosis not present

## 2021-05-26 NOTE — Progress Notes (Signed)
[facility] Consult Note Telephone: [phone]  Fax: [phone]    Date of encounter: 05/26/21 10:48 AM PATIENT NAME: Kara Davis [place] [address]   [phone] (home) [phone] (work) DOB: 01-Jun-1934 MRN: [id number] PRIMARY CARE PROVIDER:    Ginger Organ., MD,  [place] [address] [phone]  REFERRING PROVIDER:   Ginger Organ., MD [address] [place],  [place] [address] [phone]  RESPONSIBLE PARTY:    Contact Information     Name Relation Home Work Mobile   Jerilynn Mages Daughter [phone]         I met face to face with patient in her home at [address], [address] [place], [place], [place] [address]. Palliative Care was asked to follow this patient by consultation request of  Ginger Organ., MD to address advance care planning and complex medical decision making. This is a follow up visit.                                   ASSESSMENT AND PLAN / RECOMMENDATIONS:    Palliative Care Encounter secondary to Cognitive Impairment DNR status Living independently in own home with support from primary caregiver, daughter Kara Davis Recommend daughter monitor and fill pillbox  2.   Hypertension BP low normal today at 108/56. Recommend daily monitoring by family x 2 weeks and record. Encourage fluid intake. Antihypertensive has been d/c'd and continue monitoring.  Advance Care Planning/Goals of Care: Goals include to maximize quality of life and symptom management. Our advance care planning conversation included a discussion about:    Exploration of personal, cultural or spiritual beliefs that might influence medical decisions-Pt wishes to remain independent in her own home.  Has belief that when it is her time, "God will take me." Exploration of goals of care in the event of a sudden injury or illness-DNR Identification of a healthcare  agent-daughter Kara Davis Review and updating or creation of an  advance directive document . Decision not to resuscitate. CODE STATUS: DNR      Follow up Palliative Care Visit: Palliative care will continue to follow for complex medical decision making, advance care planning, and clarification of goals. Return 6 weeks or prn.   This visit was coded based on medical decision making (MDM).  PPS: 50%  HOSPICE ELIGIBILITY/DIAGNOSIS: TBD  Chief Complaint:  Follow up visit for medical management of patient with dementia and hypertension. Pt states she thinks that there may be some discussion about stopping her antihypertensive as she may not need it anymore.  HISTORY OF PRESENT ILLNESS:  Kara Davis is a 87 y.o. year old female with dementia and hypertension.  She has returned to her home to stay alone.  Denies pain, SOB, nausea, difficulty sleeping.  States appetite varies but overall is good with no significant weight change.  She is able to do simple meal prep and her daughter Kara Davis checks in on her.  She states that she walked for about 30 minutes this morning.  She tends to repeat her comments at times and mood is pleasant. Received call from pt's daughter Kara Davis prior to visit indicating that pt was back staying in her own home. Daughter checks in on her frequently and helps with essential household items/maintenance.  History obtained from review of EMR, discussion with daughter Kara Davis and/or Ms. Hase.  I reviewed available labs, medications, imaging, studies and related documents from the EMR.  Records reviewed and summarized above.   ROS  General: NAD EYES: denies vision changes ENMT: denies dysphagia Cardiovascular: denies chest pain, denies DOE Pulmonary: denies cough, denies increased SOB Abdomen: endorses good appetite, denies constipation, endorses continence of bowel GU: denies dysuria, endorses continence of urine MSK:  denies increased weakness, no falls reported Skin: denies  rashes or wounds Neurological: denies pain, denies insomnia Psych: Endorses positive mood Heme/lymph/immuno: denies bruises, abnormal bleeding  Physical Exam: Current and past weights: On 04/14/21 weight 122 lbs decreased from 127 lbs 8 ounces on 03/09/21 Constitutional: NAD General: Thin, WD EYES: anicteric sclera, lids intact, no discharge  ENMT: intact hearing, oral mucous membranes moist, dentition intact CV: S1S2, RRR, no LE edema Pulmonary: CTAB, no increased work of breathing, no cough, room air Abdomen:normo-active BS + 4 quadrants, soft and non tender, no ascites GU: deferred MSK: no sarcopenia, moves all extremities, ambulatory Skin: warm and dry, no rashes or wounds on visible skin Neuro:  no generalized weakness,  some short term memory deficit Psych: non-anxious affect, A and O x 3 Hem/lymph/immuno: no widespread bruising   Thank you for the opportunity to participate in the care of Ms. Dust.  The palliative care team will continue to follow. Please call our office at [phone] if we can be of additional assistance.   Marijo Conception, FNP   COVID-19 PATIENT SCREENING TOOL Asked and negative response unless otherwise noted:   Have you had symptoms of covid, tested positive or been in contact with someone with symptoms/positive test in the past 5-10 days?  No

## 2021-05-30 ENCOUNTER — Encounter: Payer: Self-pay | Admitting: Family Medicine

## 2021-05-30 DIAGNOSIS — R4189 Other symptoms and signs involving cognitive functions and awareness: Secondary | ICD-10-CM | POA: Insufficient documentation

## 2021-05-30 DIAGNOSIS — Z515 Encounter for palliative care: Secondary | ICD-10-CM | POA: Insufficient documentation

## 2021-07-06 ENCOUNTER — Telehealth: Payer: Self-pay | Admitting: Gastroenterology

## 2021-07-06 ENCOUNTER — Emergency Department (HOSPITAL_BASED_OUTPATIENT_CLINIC_OR_DEPARTMENT_OTHER): Payer: Medicare HMO

## 2021-07-06 ENCOUNTER — Encounter (HOSPITAL_BASED_OUTPATIENT_CLINIC_OR_DEPARTMENT_OTHER): Payer: Self-pay | Admitting: Emergency Medicine

## 2021-07-06 ENCOUNTER — Other Ambulatory Visit: Payer: Self-pay

## 2021-07-06 ENCOUNTER — Emergency Department (HOSPITAL_BASED_OUTPATIENT_CLINIC_OR_DEPARTMENT_OTHER)
Admission: EM | Admit: 2021-07-06 | Discharge: 2021-07-06 | Disposition: A | Discharge status: Home / Self Care | Payer: Medicare HMO | Attending: Emergency Medicine | Admitting: Emergency Medicine

## 2021-07-06 DIAGNOSIS — N3000 Acute cystitis without hematuria: Secondary | ICD-10-CM | POA: Diagnosis not present

## 2021-07-06 DIAGNOSIS — R110 Nausea: Secondary | ICD-10-CM | POA: Diagnosis not present

## 2021-07-06 DIAGNOSIS — I700 Atherosclerosis of aorta: Secondary | ICD-10-CM | POA: Diagnosis not present

## 2021-07-06 DIAGNOSIS — E039 Hypothyroidism, unspecified: Secondary | ICD-10-CM | POA: Diagnosis not present

## 2021-07-06 DIAGNOSIS — G8929 Other chronic pain: Secondary | ICD-10-CM | POA: Diagnosis not present

## 2021-07-06 DIAGNOSIS — L0100 Impetigo, unspecified: Secondary | ICD-10-CM | POA: Insufficient documentation

## 2021-07-06 DIAGNOSIS — R1032 Left lower quadrant pain: Secondary | ICD-10-CM | POA: Diagnosis not present

## 2021-07-06 DIAGNOSIS — Z79899 Other long term (current) drug therapy: Secondary | ICD-10-CM | POA: Insufficient documentation

## 2021-07-06 DIAGNOSIS — R195 Other fecal abnormalities: Secondary | ICD-10-CM | POA: Diagnosis not present

## 2021-07-06 DIAGNOSIS — R109 Unspecified abdominal pain: Secondary | ICD-10-CM | POA: Diagnosis not present

## 2021-07-06 LAB — URINALYSIS, ROUTINE W REFLEX MICROSCOPIC
Bilirubin Urine: NEGATIVE
Glucose, UA: NEGATIVE mg/dL
Hgb urine dipstick: NEGATIVE
Ketones, ur: NEGATIVE mg/dL
Nitrite: NEGATIVE
Specific Gravity, Urine: 1.025 (ref 1.005–1.030)
pH: 6 (ref 5.0–8.0)

## 2021-07-06 LAB — CBC WITH DIFFERENTIAL/PLATELET
Abs Immature Granulocytes: 0.03 10*3/uL (ref 0.00–0.07)
Basophils Absolute: 0.1 10*3/uL (ref 0.0–0.1)
Basophils Relative: 1 %
Eosinophils Absolute: 0.2 10*3/uL (ref 0.0–0.5)
Eosinophils Relative: 2 %
HCT: 38.2 % (ref 36.0–46.0)
Hemoglobin: 12.4 g/dL (ref 12.0–15.0)
Immature Granulocytes: 1 %
Lymphocytes Relative: 14 %
Lymphs Abs: 0.9 10*3/uL (ref 0.7–4.0)
MCH: 26.6 pg (ref 26.0–34.0)
MCHC: 32.5 g/dL (ref 30.0–36.0)
MCV: 81.8 fL (ref 80.0–100.0)
Monocytes Absolute: 0.6 10*3/uL (ref 0.1–1.0)
Monocytes Relative: 10 %
Neutro Abs: 4.4 10*3/uL (ref 1.7–7.7)
Neutrophils Relative %: 72 %
Platelets: 264 10*3/uL (ref 150–400)
RBC: 4.67 MIL/uL (ref 3.87–5.11)
RDW: 14.6 % (ref 11.5–15.5)
WBC: 6.2 10*3/uL (ref 4.0–10.5)
nRBC: 0 % (ref 0.0–0.2)

## 2021-07-06 LAB — COMPREHENSIVE METABOLIC PANEL
ALT: 54 U/L — ABNORMAL HIGH (ref 0–44)
AST: 41 U/L (ref 15–41)
Albumin: 4.2 g/dL (ref 3.5–5.0)
Alkaline Phosphatase: 94 U/L (ref 38–126)
Anion gap: 7 (ref 5–15)
BUN: 15 mg/dL (ref 8–23)
CO2: 28 mmol/L (ref 22–32)
Calcium: 9 mg/dL (ref 8.9–10.3)
Chloride: 100 mmol/L (ref 98–111)
Creatinine, Ser: 0.86 mg/dL (ref 0.44–1.00)
GFR, Estimated: >60 mL/min (ref >60)
Glucose, Bld: 104 mg/dL — ABNORMAL HIGH (ref 70–99)
Potassium: 4.1 mmol/L (ref 3.5–5.1)
Sodium: 135 mmol/L (ref 135–145)
Total Bilirubin: 0.6 mg/dL (ref 0.3–1.2)
Total Protein: 6.9 g/dL (ref 6.5–8.1)

## 2021-07-06 LAB — LIPASE, BLOOD: Lipase: 18 U/L (ref 11–51)

## 2021-07-06 MED ORDER — SODIUM CHLORIDE 0.9 % IV SOLN
1.0000 g | Freq: Once | INTRAVENOUS | Status: AC
  Administered 2021-07-06: 1 g via INTRAVENOUS
  Filled 2021-07-06: qty 10

## 2021-07-06 MED ORDER — SODIUM CHLORIDE 0.9 % IV BOLUS
500.0000 mL | Freq: Once | INTRAVENOUS | Status: AC
  Administered 2021-07-06: 500 mL via INTRAVENOUS

## 2021-07-06 MED ORDER — FENTANYL CITRATE PF 50 MCG/ML IJ SOSY
50.0000 ug | PREFILLED_SYRINGE | Freq: Once | INTRAMUSCULAR | Status: AC
  Administered 2021-07-06: 50 ug via INTRAVENOUS
  Filled 2021-07-06: qty 1

## 2021-07-06 MED ORDER — IOHEXOL 300 MG/ML  SOLN
100.0000 mL | Freq: Once | INTRAMUSCULAR | Status: AC | PRN
  Administered 2021-07-06: 75 mL via INTRAVENOUS

## 2021-07-06 MED ORDER — MORPHINE SULFATE (PF) 2 MG/ML IV SOLN
2.0000 mg | Freq: Once | INTRAVENOUS | Status: AC
  Administered 2021-07-06: 2 mg via INTRAVENOUS
  Filled 2021-07-06: qty 1

## 2021-07-06 MED ORDER — CEPHALEXIN 500 MG PO CAPS: 500.0000 mg | ORAL_CAPSULE | Freq: Two times a day (BID) | ORAL | 0 refills | Status: DC

## 2021-07-06 MED ORDER — MUPIROCIN CALCIUM 2 % EX CREA: 1.0000 "application " | TOPICAL_CREAM | Freq: Two times a day (BID) | CUTANEOUS | 0 refills | Status: DC

## 2021-07-06 MED ORDER — ONDANSETRON HCL 4 MG/2ML IJ SOLN
4.0000 mg | Freq: Once | INTRAMUSCULAR | Status: AC
  Administered 2021-07-06: 4 mg via INTRAVENOUS
  Filled 2021-07-06: qty 2

## 2021-07-06 NOTE — ED Provider Notes (Signed)
?[facility] ?Provider Note ? ? ?CSN: [id number] ?Arrival date & time: 07/06/21  1733 ? ?  ? ?History ? ?Chief Complaint  ?Patient presents with  ? Abdominal Pain  ? ? ?Kara Davis is a 87 y.o. female. ? ?Patient is a 87 year old female who presents with abdominal pain.  She reports a 2-day history of pain across her abdomen.  She cannot really localize it.  She has an associated nausea.  She has had a little bit of loose stools.  No fevers.  No urinary symptoms.  No vomiting.  She does states she has a history of some chronic abdominal pain but this seems to be a little bit different than her chronic pain.  She tried to take a quarter of a Vicodin tablet.  She did not stated help very much.  She says she does not think Vicodin really helps with her pain and makes her constipated.  She does not like to take it for those reasons.  She also has a sore develop on her right external ear.  Her daughter who is at bedside says that the patient likes to lay on that side frequently and thinks it is like a sore from her laying on it.  They has been using over-the-counter antibiotic cream and peroxide with no improvement in symptoms.  Its been there about 5 days. ? ? ?  ? ?Home Medications ?Prior to Admission medications   ?Medication Sig Start Date End Date Taking? Authorizing Provider  ?cephALEXin (KEFLEX) 500 MG capsule Take 1 capsule (500 mg total) by mouth 2 (two) times daily. 07/06/21  Yes Malvin Johns, MD  ?mupirocin cream (BACTROBAN) 2 % Apply 1 application. topically 2 (two) times daily. 07/06/21  Yes Malvin Johns, MD  ?ALPRAZolam Duanne Moron) 0.25 MG tablet TAKE 1 TABLET BY MOUTH 3 TIMES DAILY AS NEEDED FOR ANXIETY. **GREENSTONE** 05/18/21   Cottle, Billey Co., MD  ?Cholecalciferol (VITAMIN D-3) 25 MCG (1000 UT) CAPS Take 1,000 Units by mouth every 7 (seven) days.    [provider]  ?HYDROcodone-acetaminophen (NORCO/VICODIN) 5-325 MG tablet Take 1 tablet by mouth every 4-6 hours as  needed for abdominal pain ?Patient taking differently: Take 0.125-0.25 tablets by mouth See admin instructions. Take 0.125-0.25 tablet by mouth at bedtime- EVERY OTHER NIGHT 03/14/15   Mauri Pole, MD  ?levothyroxine (SYNTHROID) 75 MCG tablet Take 75 mcg by mouth daily before breakfast. 11/06/18   [provider]  ?   ? ?Allergies    ?Patient has no known allergies.   ? ?Review of Systems   ?Review of Systems  ?Constitutional:  Positive for fatigue. Negative for chills, diaphoresis and fever.  ?HENT:  Negative for congestion, rhinorrhea and sneezing.   ?Eyes: Negative.   ?Respiratory:  Negative for cough, chest tightness and shortness of breath.   ?Cardiovascular:  Negative for chest pain and leg swelling.  ?Gastrointestinal:  Positive for abdominal pain and nausea. Negative for blood in stool, diarrhea and vomiting.  ?Genitourinary:  Negative for difficulty urinating, flank pain, frequency and hematuria.  ?Musculoskeletal:  Negative for arthralgias and back pain.  ?Skin:  Positive for wound. Negative for rash.  ?Neurological:  Negative for dizziness, speech difficulty, weakness, numbness and headaches.  ? ?Physical Exam ?Updated Vital Signs ?BP (!) 123/52   Pulse 64   Temp 98.8 ?F (37.1 ?C) (Oral)   Resp 13   Ht '5\' 2"'$  (1.575 m)   Wt 53.5 kg   SpO2 95%   BMI 21.58 kg/m?  ?  Physical Exam ?Constitutional:   ?   Appearance: She is well-developed.  ?HENT:  ?   Head: Normocephalic and atraumatic.  ?   Ears:  ?   Comments: Patient has a yellow crusted wound to her upper ear helix.  No drainage.  No swelling ?Eyes:  ?   Pupils: Pupils are equal, round, and reactive to light.  ?Cardiovascular:  ?   Rate and Rhythm: Normal rate and regular rhythm.  ?   Heart sounds: Normal heart sounds.  ?Pulmonary:  ?   Effort: Pulmonary effort is normal. No respiratory distress.  ?   Breath sounds: Normal breath sounds. No wheezing or rales.  ?Chest:  ?   Chest wall: No tenderness.  ?Abdominal:  ?   General: Bowel  sounds are normal.  ?   Palpations: Abdomen is soft.  ?   Tenderness: There is generalized abdominal tenderness. There is no guarding or rebound.  ?Musculoskeletal:     ?   General: Normal range of motion.  ?   Cervical back: Normal range of motion and neck supple.  ?Lymphadenopathy:  ?   Cervical: No cervical adenopathy.  ?Skin: ?   General: Skin is warm and dry.  ?   Findings: No rash.  ?Neurological:  ?   Mental Status: She is alert and oriented to person, place, and time.  ? ? ?ED Results / Procedures / Treatments   ?Labs ?(all labs ordered are listed, but only abnormal results are displayed) ?Labs Reviewed  ?COMPREHENSIVE METABOLIC PANEL - Abnormal; Notable for the following components:  ?    Result Value  ? Glucose, Bld 104 (*)   ? ALT 54 (*)   ? All other components within normal limits  ?URINALYSIS, ROUTINE W REFLEX MICROSCOPIC - Abnormal; Notable for the following components:  ? Protein, ur TRACE (*)   ? Leukocytes,Ua LARGE (*)   ? All other components within normal limits  ?URINE CULTURE  ?LIPASE, BLOOD  ?CBC WITH DIFFERENTIAL/PLATELET  ? ? ?EKG ?None ? ?Radiology ?CT Abdomen Pelvis W Contrast ? ?Result Date: 07/06/2021 ?CLINICAL DATA:  Acute nonlocalized abdominal pain since yesterday. EXAM: CT ABDOMEN AND PELVIS WITH CONTRAST TECHNIQUE: Multidetector CT imaging of the abdomen and pelvis was performed using the standard protocol following bolus administration of intravenous contrast. RADIATION DOSE REDUCTION: This exam was performed according to the departmental dose-optimization program which includes automated exposure control, adjustment of the mA and/or kV according to patient size and/or use of iterative reconstruction technique. CONTRAST:  78m OMNIPAQUE IOHEXOL 300 MG/ML  SOLN COMPARISON:  05/25/2014 FINDINGS: Lower chest: Fibrosis or atelectasis in the lung bases. Hepatobiliary: No focal liver abnormality is seen. No gallstones, gallbladder wall thickening, or biliary dilatation. Pancreas:  Unremarkable. No pancreatic ductal dilatation or surrounding inflammatory changes. Spleen: Normal in size without focal abnormality. Adrenals/Urinary Tract: Adrenal glands are unremarkable. Kidneys are normal, without renal calculi, focal solid lesion, or hydronephrosis. Bladder is unremarkable. Stomach/Bowel: Stomach, small bowel, and colon are not abnormally distended. No wall thickening or inflammatory changes. Colonic diverticulosis without evidence of acute diverticulitis. The appendix is not identified. Vascular/Lymphatic: Aortic atherosclerosis. No enlarged abdominal or pelvic lymph nodes. Reproductive: Uterus and bilateral adnexa are unremarkable. Other: No abdominal wall hernia or abnormality. No abdominopelvic ascites. Musculoskeletal: No acute or significant osseous findings. IMPRESSION: 1. No acute abnormality demonstrated in the abdomen or pelvis. No evidence of bowel obstruction or inflammation. 2. Fibrosis or atelectasis in the lung bases. 3. Aortic atherosclerosis. Electronically Signed   By: WGwyndolyn Saxon  Gerilyn Nestle M.D.   On: 07/06/2021 19:54   ? ?Procedures ?Procedures  ? ? ?Medications Ordered in ED ?Medications  ?sodium chloride 0.9 % bolus 500 mL (0 mLs Intravenous Stopped 07/06/21 2019)  ?fentaNYL (SUBLIMAZE) injection 50 mcg (50 mcg Intravenous Given 07/06/21 1842)  ?ondansetron [facility]) injection 4 mg (4 mg Intravenous Given 07/06/21 1838)  ?morphine (PF) 2 MG/ML injection 2 mg (2 mg Intravenous Given 07/06/21 1916)  ?iohexol (OMNIPAQUE) 300 MG/ML solution 100 mL (75 mLs Intravenous Contrast Given 07/06/21 1937)  ?cefTRIAXone (ROCEPHIN) 1 g in sodium chloride 0.9 % 100 mL IVPB (0 g Intravenous Stopped 07/06/21 2108)  ?morphine (PF) 2 MG/ML injection 2 mg (2 mg Intravenous Given 07/06/21 2032)  ? ? ?ED Course/ Medical Decision Making/ A&P ?  ?                        ?Medical Decision Making ?Amount and/or Complexity of Data Reviewed ?Labs: ordered. ?Radiology: ordered. ? ?Risk ?Prescription drug  management. ? ? ?Patient is a 87 year old female who presents with abdominal pain.  She has had a history of some chronic abdominal pain in the past but this appeared a bit different and she had some associated nausea.  Labs are drawn which

## 2021-07-06 NOTE — ED Notes (Signed)
RT called to patient's room due to desaturations to 87%. RT attempted to place patient on oxygen via [place]. Patient stated that she would not be able to tolerate a nasal cannula in her nose due to being nauseated and vomiting. Patient states that she is uncomfortable. RT offered to reposition in bed, patient declined. Patient requesting pain medication. Paramedic Josh made aware.  ?

## 2021-07-06 NOTE — Discharge Instructions (Addendum)
Use antibiotics as discussed.  Follow-up with your doctor in the next 2 to 3 days for recheck.  The wound on your ear needs to be rechecked.  Return to emergency room if you have any worsening symptoms. ?

## 2021-07-06 NOTE — ED Triage Notes (Signed)
Nausea since yesterday and  mid abd pain since this am took a 1/4 of a vicodin got better but now worse , no vomiting or diarrhea, has an infection on her ear that is being txn with antbx ?

## 2021-07-06 NOTE — ED Notes (Signed)
Pts family given all discharge forms and understood. ?

## 2021-07-06 NOTE — ED Notes (Signed)
Pt complained of increase in pain to her abdomen. Stated that the pain was all over her abdomen, increased upon palpation and started to worsen after being given the pain medication. Provider notified. ?

## 2021-07-06 NOTE — Telephone Encounter (Signed)
Inbound call from patients daughter stating that she is taking her mom to the ER and she just wanted to make [place] aware just in case they keep her and she is not able to make it her appointment tomorrow morning. ?

## 2021-07-07 ENCOUNTER — Encounter: Payer: Self-pay | Admitting: Gastroenterology

## 2021-07-07 ENCOUNTER — Ambulatory Visit: Payer: Medicare HMO | Admitting: Gastroenterology

## 2021-07-07 VITALS — BP 118/52 | HR 68 | Ht 62.0 in | Wt 112.4 lb

## 2021-07-07 DIAGNOSIS — R110 Nausea: Secondary | ICD-10-CM | POA: Diagnosis not present

## 2021-07-07 DIAGNOSIS — R1084 Generalized abdominal pain: Secondary | ICD-10-CM | POA: Diagnosis not present

## 2021-07-07 DIAGNOSIS — R1013 Epigastric pain: Secondary | ICD-10-CM | POA: Diagnosis not present

## 2021-07-07 MED ORDER — ONDANSETRON 4 MG PO TBDP
4.0000 mg | ORAL_TABLET | Freq: Every day | ORAL | 0 refills | Status: DC | PRN
Start: 2021-07-07 — End: 2021-08-03

## 2021-07-07 NOTE — Patient Instructions (Addendum)
We have sent the following medications to your pharmacy for you to pick up at your convenience:   Zofran ? ?Use FDgard 1 capsule three times a day as needed ? ?Follow up as needed ? ?If you are age 85 or older, your body mass index should be between 23-30. Your Body mass index is 20.56 kg/m?Marland Kitchen If this is out of the aforementioned range listed, please consider follow up with your Primary Care Provider. ? ?If you are age 51 or younger, your body mass index should be between 19-25. Your Body mass index is 20.56 kg/m?Marland Kitchen If this is out of the aformentioned range listed, please consider follow up with your Primary Care Provider.  ? ?________________________________________________________ ? ?The [facility] providers would like to encourage you to use [facility] to communicate with providers for non-urgent requests or questions.  Due to long hold times on the telephone, sending your provider a message by [facility] may be a faster and more efficient way to get a response.  Please allow 48 business hours for a response.  Please remember that this is for non-urgent requests.  ?_______________________________________________________  ? ?I appreciate the  opportunity to care for you ? ?Thank You  ? ?Harl Bowie , MD  ?

## 2021-07-07 NOTE — Progress Notes (Signed)
? ?       ? ?Kara Davis    [id number]    09-Mar-1935 ? ?Primary Care Physician:Shaw, Emily Filbert., MD ? ?Referring Physician: Ginger Organ., MD ?[address] ?[place],  [place] [address] ? ? ?Chief complaint: Nausea, generalized abdominal pain ? ?HPI: ?87 year old very pleasant female here to reestablish GI care accompanied by her daughter ?Complains of generalized abdominal pain, she has history of chronic abdominal pain was previously followed by Dr. Olevia Perches and has been on chronic opiates for pain control.  She has had extensive GI work-up in the past that was unrevealing for any significant pathology. ? ?She complains of severe nausea and inability to sleep.  She is requesting sleep medication.  Her sleep pattern is altered, she sleeps mostly during daytime because she is unable to fall asleep during nighttime.  She is has decreased appetite and has not been eating much.  She has lost significant weight in the past year. ? ?Presented to ER yesterday, was noted to have dirty UA and was started on antibiotics for UTI ? ?Denies any melena, blood per rectum or change in bowel habits.  No vomiting, odynophagia or dysphagia. ? ?CT abdomen & pelvis with contrast 07/06/21 ?1. No acute abnormality demonstrated in the abdomen or pelvis. No ?evidence of bowel obstruction or inflammation. ?2. Fibrosis or atelectasis in the lung bases. ?3. Aortic atherosclerosis. ? ?EGD and colonoscopy in 2014 were unremarkable except for fundic gland polyps.  ? ?Outpatient Encounter Medications as of 07/07/2021  ?Medication Sig  ? ALPRAZolam (XANAX) 0.25 MG tablet TAKE 1 TABLET BY MOUTH 3 TIMES DAILY AS NEEDED FOR ANXIETY. **GREENSTONE**  ? cephALEXin (KEFLEX) 500 MG capsule Take 1 capsule (500 mg total) by mouth 2 (two) times daily.  ? Cholecalciferol (VITAMIN D-3) 25 MCG (1000 UT) CAPS Take 1,000 Units by mouth every 7 (seven) days.  ? HYDROcodone-acetaminophen (NORCO/VICODIN) 5-325 MG tablet Take 1 tablet by mouth every 4-6  hours as needed for abdominal pain (Patient taking differently: Take 0.125-0.25 tablets by mouth See admin instructions. Take 0.125-0.25 tablet by mouth at bedtime- EVERY OTHER NIGHT)  ? levothyroxine (SYNTHROID) 75 MCG tablet Take 75 mcg by mouth daily before breakfast.  ? mupirocin cream (BACTROBAN) 2 % Apply 1 application. topically 2 (two) times daily.  ? ?No facility-administered encounter medications on file as of 07/07/2021.  ? ? ?Allergies as of 07/07/2021  ? (No Known Allergies)  ? ? ?Past Medical History:  ?Diagnosis Date  ? Aneurysm ([place])   ? over left eye  ? Cataracts, bilateral   ? Dementia ([place])   ? DEPRESSION 12/06/2006  ? Dr. Clovis Pu  ? DIVERTICULOSIS, COLON 12/06/2006  ? GERD 12/06/2006  ? HYPERCHOLESTEROLEMIA 07/27/2007  ? HYPERTENSION 07/27/2007  ? Irritable bowel syndrome 12/06/2006  ? Osteoporosis   ? rx refused  ? Palpitations 05/23/2008  ? Tubular adenoma 09/09/2009  ? VITAMIN D DEFICIENCY 05/23/2008  ? ? ?Past Surgical History:  ?Procedure Laterality Date  ? APPENDECTOMY    ? CATARACT EXTRACTION    ? EXPLORATORY LAPAROTOMY  1993  ? secondary to abd. pain  ? thumb surgery    ? for fracture  ? ? ?Family History  ?Problem Relation Age of Onset  ? Heart disease Mother   ? Diabetes Brother   ? Prostate cancer Brother   ? Colon cancer Neg Hx   ? ? ?Social History  ? ?Socioeconomic History  ? Marital status: Widowed  ?  Spouse name: Not on file  ?  Number of children: 2  ? Years of education: Not on file  ? Highest education level: Not on file  ?Occupational History  ? Occupation: Unemployed  ?  Employer: RETIRED  ?Tobacco Use  ? Smoking status: Former  ?  Types: Cigarettes  ? Smokeless tobacco: Never  ?Vaping Use  ? Vaping Use: Never used  ?Substance and Sexual Activity  ? Alcohol use: Yes  ?  Comment: [place] the beach  ? Drug use: No  ? Sexual activity: Not on file  ?Other Topics Concern  ? Not on file  ?Social History Narrative  ? Divorced x many years  ? Daily Caffeine Use-1 cup  of 1/2 caf coffee  ? ?Social Determinants of Health  ? ?Financial Resource Strain: Not on file  ?Food Insecurity: Not on file  ?Transportation Needs: Not on file  ?Physical Activity: Not on file  ?Stress: Not on file  ?Social Connections: Not on file  ?Intimate Partner Violence: Not on file  ? ? ? ? ?Review of systems: ?All other review of systems negative except as mentioned in the HPI. ? ? ?Physical Exam: ?Vitals:  ? 07/07/21 0827  ?BP: (!) 118/52  ?Pulse: 68  ? ?Body mass index is 20.56 kg/m?. ?Gen:      No acute distress ?HEENT:  sclera anicteric ?Abd:      soft, non-tender; no palpable masses, no distension ?Ext:    No edema ?Neuro: alert and oriented x 3 ?Psych: normal mood and affect ? ?Data Reviewed: ? ?Reviewed labs, radiology imaging, old records and pertinent past GI work up ? ? ?Assessment and Plan/Recommendations: ? ?87 year old very pleasant female with mild dementia, chronic GERD, chronic generalized abdominal pain with complaints of severe nausea ?Reviewed CT abdomen pelvis with contrast that was done in the ER yesterday, was unremarkable ?She has had extensive GI work-up in the past that was unrevealing for significant pathology ?She has history of chronic opiate dependence ? ?Use Zofran 4 mg daily as needed for severe nausea ?Small frequent meals with high calorie and high-protein ? ?She is currently enrolled in palliative home care ?Given cognitive decline, will benefit from frequent home monitoring visits and also advised her daughter to explore community resources that are available ? ?Use FD guard 1 capsule up to 3 times daily as needed for dyspepsia ? ?Complete antibiotic course for UTI ? ?Return as needed ? ?This visit required 60 minutes of patient care (this includes precharting, chart review, review of results, face-to-face time used for counseling as well as treatment plan and follow-up. The patient was provided an opportunity to ask questions and all were answered. The patient agreed  with the plan and demonstrated an understanding of the instructions. ? ?K. Denzil Magnuson , MD ?  ? ?CC: Ginger Organ., MD ? ? ?

## 2021-07-08 ENCOUNTER — Other Ambulatory Visit: Payer: Self-pay

## 2021-07-08 DIAGNOSIS — R1084 Generalized abdominal pain: Secondary | ICD-10-CM

## 2021-07-08 DIAGNOSIS — R110 Nausea: Secondary | ICD-10-CM

## 2021-07-08 LAB — URINE CULTURE

## 2021-07-08 MED ORDER — SUCRALFATE 1 GM/10ML PO SUSP: 1.0000 g | Freq: Three times a day (TID) | ORAL | 2 refills | Status: DC

## 2021-07-08 NOTE — Telephone Encounter (Signed)
Spoke with the daughter Maudie Mercury. ?Discussed the plan of care.  ?Agrees to this plan. Discussed how to take Carafate including not to take it with other medications. Discussed possible timing of her medication in order to maximize benefit. ?Prescription to [facility]. ?Order for UGI asap placed and schedulers notified. ?

## 2021-07-08 NOTE — Telephone Encounter (Signed)
Patient was seen in office 07/07/21. ?The daughter calls this morning about the patient. Her mother called her late last night with complaints of worsening abdominal pain and nausea. She was more distraught than her usual. Daughter gave her Vicodin and Zofran. She did not get relief from either one of these. Continues to be in "a lot of pain and very nauseated." She will not eat due to the extreme nausea. The daughter voices concern that there could be something inside her stomach that is responsible for her symptoms. She asks if an EGD is possible. She does not know how often her mother can take Zofran.  ?(Daughter [name] [phone]) ?

## 2021-07-08 NOTE — Telephone Encounter (Signed)
Nausea could be secondary to opiates.  She underwent CT abdomen pelvis with contrast in the ER that was unrevealing for any acute pathology including no gastric wall thickening or mass lesions. ?Please order barium upper GI series, will be noninvasive modality. ?Please send prescription for Carafate 1 g before meals and at bedtime as needed X120 tabs with 0 refills.  Thank you ?

## 2021-07-09 ENCOUNTER — Other Ambulatory Visit: Payer: Self-pay | Admitting: Gastroenterology

## 2021-07-09 NOTE — Telephone Encounter (Signed)
UGI 07/15/21 ?

## 2021-07-11 ENCOUNTER — Encounter (HOSPITAL_COMMUNITY): Payer: Self-pay

## 2021-07-11 ENCOUNTER — Other Ambulatory Visit: Payer: Self-pay

## 2021-07-11 ENCOUNTER — Emergency Department (HOSPITAL_COMMUNITY)
Admission: EM | Admit: 2021-07-11 | Discharge: 2021-07-11 | Disposition: A | Discharge status: Home / Self Care | Payer: Medicare HMO | Attending: Emergency Medicine | Admitting: Emergency Medicine

## 2021-07-11 ENCOUNTER — Emergency Department (HOSPITAL_COMMUNITY): Payer: Medicare HMO

## 2021-07-11 DIAGNOSIS — I959 Hypotension, unspecified: Secondary | ICD-10-CM | POA: Diagnosis not present

## 2021-07-11 DIAGNOSIS — R531 Weakness: Secondary | ICD-10-CM | POA: Diagnosis not present

## 2021-07-11 DIAGNOSIS — R197 Diarrhea, unspecified: Secondary | ICD-10-CM | POA: Insufficient documentation

## 2021-07-11 DIAGNOSIS — M25551 Pain in right hip: Secondary | ICD-10-CM | POA: Diagnosis not present

## 2021-07-11 DIAGNOSIS — E039 Hypothyroidism, unspecified: Secondary | ICD-10-CM | POA: Diagnosis not present

## 2021-07-11 DIAGNOSIS — Z79899 Other long term (current) drug therapy: Secondary | ICD-10-CM | POA: Diagnosis not present

## 2021-07-11 DIAGNOSIS — R109 Unspecified abdominal pain: Secondary | ICD-10-CM | POA: Diagnosis not present

## 2021-07-11 DIAGNOSIS — M25552 Pain in left hip: Secondary | ICD-10-CM | POA: Insufficient documentation

## 2021-07-11 DIAGNOSIS — F32A Depression, unspecified: Secondary | ICD-10-CM | POA: Diagnosis not present

## 2021-07-11 DIAGNOSIS — M160 Bilateral primary osteoarthritis of hip: Secondary | ICD-10-CM | POA: Diagnosis not present

## 2021-07-11 DIAGNOSIS — I517 Cardiomegaly: Secondary | ICD-10-CM | POA: Diagnosis not present

## 2021-07-11 DIAGNOSIS — R1084 Generalized abdominal pain: Secondary | ICD-10-CM | POA: Diagnosis not present

## 2021-07-11 DIAGNOSIS — W19XXXA Unspecified fall, initial encounter: Secondary | ICD-10-CM | POA: Insufficient documentation

## 2021-07-11 DIAGNOSIS — S79912A Unspecified injury of left hip, initial encounter: Secondary | ICD-10-CM | POA: Diagnosis not present

## 2021-07-11 DIAGNOSIS — Z743 Need for continuous supervision: Secondary | ICD-10-CM | POA: Diagnosis not present

## 2021-07-11 DIAGNOSIS — R69 Illness, unspecified: Secondary | ICD-10-CM | POA: Diagnosis not present

## 2021-07-11 LAB — URINALYSIS, ROUTINE W REFLEX MICROSCOPIC
Bilirubin Urine: NEGATIVE
Glucose, UA: NEGATIVE mg/dL
Hgb urine dipstick: NEGATIVE
Ketones, ur: 80 mg/dL — AB
Leukocytes,Ua: NEGATIVE
Nitrite: NEGATIVE
Protein, ur: NEGATIVE mg/dL
Specific Gravity, Urine: 1.016 (ref 1.005–1.030)
pH: 6 (ref 5.0–8.0)

## 2021-07-11 LAB — CBC WITH DIFFERENTIAL/PLATELET
Abs Immature Granulocytes: 0.03 10*3/uL (ref 0.00–0.07)
Basophils Absolute: 0.1 10*3/uL (ref 0.0–0.1)
Basophils Relative: 1 %
Eosinophils Absolute: 0 10*3/uL (ref 0.0–0.5)
Eosinophils Relative: 0 %
HCT: 38.8 % (ref 36.0–46.0)
Hemoglobin: 12.9 g/dL (ref 12.0–15.0)
Immature Granulocytes: 0 %
Lymphocytes Relative: 7 %
Lymphs Abs: 0.6 10*3/uL — ABNORMAL LOW (ref 0.7–4.0)
MCH: 27.1 pg (ref 26.0–34.0)
MCHC: 33.2 g/dL (ref 30.0–36.0)
MCV: 81.5 fL (ref 80.0–100.0)
Monocytes Absolute: 0.6 10*3/uL (ref 0.1–1.0)
Monocytes Relative: 7 %
Neutro Abs: 7.5 10*3/uL (ref 1.7–7.7)
Neutrophils Relative %: 85 %
Platelets: 253 10*3/uL (ref 150–400)
RBC: 4.76 MIL/uL (ref 3.87–5.11)
RDW: 14.1 % (ref 11.5–15.5)
WBC: 8.8 10*3/uL (ref 4.0–10.5)
nRBC: 0 % (ref 0.0–0.2)

## 2021-07-11 LAB — COMPREHENSIVE METABOLIC PANEL
ALT: 90 U/L — ABNORMAL HIGH (ref 0–44)
AST: 49 U/L — ABNORMAL HIGH (ref 15–41)
Albumin: 3.2 g/dL — ABNORMAL LOW (ref 3.5–5.0)
Alkaline Phosphatase: 73 U/L (ref 38–126)
Anion gap: 10 (ref 5–15)
BUN: 11 mg/dL (ref 8–23)
CO2: 24 mmol/L (ref 22–32)
Calcium: 7.8 mg/dL — ABNORMAL LOW (ref 8.9–10.3)
Chloride: 100 mmol/L (ref 98–111)
Creatinine, Ser: 0.8 mg/dL (ref 0.44–1.00)
GFR, Estimated: >60 mL/min (ref >60)
Glucose, Bld: 84 mg/dL (ref 70–99)
Potassium: 3.6 mmol/L (ref 3.5–5.1)
Sodium: 134 mmol/L — ABNORMAL LOW (ref 135–145)
Total Bilirubin: 0.6 mg/dL (ref 0.3–1.2)
Total Protein: 5.8 g/dL — ABNORMAL LOW (ref 6.5–8.1)

## 2021-07-11 LAB — LIPASE, BLOOD: Lipase: 26 U/L (ref 11–51)

## 2021-07-11 LAB — TSH: TSH: 3.398 u[IU]/mL (ref 0.350–4.500)

## 2021-07-11 MED ORDER — ONDANSETRON 4 MG PO TBDP: 4.0000 mg | ORAL_TABLET | Freq: Every day | ORAL | Status: DC | PRN

## 2021-07-11 MED ORDER — CEPHALEXIN 500 MG PO CAPS
500.0000 mg | ORAL_CAPSULE | Freq: Two times a day (BID) | ORAL | Status: DC
  Administered 2021-07-11: 500 mg via ORAL
  Filled 2021-07-11: qty 1

## 2021-07-11 MED ORDER — HYDROCODONE-ACETAMINOPHEN 5-325 MG PO TABS: 0.5000 | ORAL_TABLET | Freq: Four times a day (QID) | ORAL | Status: DC | PRN

## 2021-07-11 MED ORDER — SUCRALFATE 1 GM/10ML PO SUSP
1.0000 g | Freq: Three times a day (TID) | ORAL | Status: DC
  Administered 2021-07-11: 1 g via ORAL
  Filled 2021-07-11: qty 10

## 2021-07-11 MED ORDER — MORPHINE SULFATE (PF) 2 MG/ML IV SOLN
2.0000 mg | Freq: Once | INTRAVENOUS | Status: AC
  Administered 2021-07-11: 2 mg via INTRAVENOUS
  Filled 2021-07-11: qty 1

## 2021-07-11 MED ORDER — SODIUM CHLORIDE 0.9 % IV SOLN: INTRAVENOUS | Status: DC

## 2021-07-11 MED ORDER — LEVOTHYROXINE SODIUM 50 MCG PO TABS: 75.0000 ug | ORAL_TABLET | Freq: Every day | ORAL | Status: DC

## 2021-07-11 MED ORDER — ACETAMINOPHEN 325 MG PO TABS
325.0000 mg | ORAL_TABLET | Freq: Four times a day (QID) | ORAL | Status: DC | PRN
Start: 2021-07-11 — End: 2021-07-12

## 2021-07-11 MED ORDER — SODIUM CHLORIDE 0.9 % IV BOLUS
500.0000 mL | Freq: Once | INTRAVENOUS | Status: AC
  Administered 2021-07-11: 500 mL via INTRAVENOUS

## 2021-07-11 MED ORDER — ONDANSETRON HCL 4 MG/2ML IJ SOLN
4.0000 mg | Freq: Once | INTRAMUSCULAR | Status: AC
  Administered 2021-07-11: 4 mg via INTRAVENOUS
  Filled 2021-07-11: qty 2

## 2021-07-11 NOTE — ED Provider Notes (Signed)
Family now desires discharge of the patient. ? ?They plan on following up closely with the patient's PCP on Monday or Tuesday of this week for assistance with finding placement for the patient. ? ?They decline additional holding / boarding here in the ED until placement can be obtained. ?  ?Valarie Merino, MD ?07/11/21 1840 ? ?

## 2021-07-11 NOTE — Discharge Instructions (Addendum)
Return for any problem.  ? ?Follow-up closely with your regular outpatient care provider for assistance with placement. ?

## 2021-07-11 NOTE — Progress Notes (Signed)
.Transition of Care [facility]) - Emergency Department Mini Assessment ? ? ?Patient Details  ?Name: Kara Davis ?MRN: [id number] ?Date of Birth: [birth year] ? ?Transition of Care (TOC) CM/SW Contact:    ?Arlie Solomons Rawlins Stuard, LCSW ?Phone Number: ?07/11/2021, 12:41 PM ? ? ?Clinical Narrative: ? ?TOC CSW received consult , CSW called pt's room , pt's daughter Maudie Mercury answered the phone. Pt's daughter stated pt is unable to walk , she stated pt was unable to get out of the bed this morning to make it to bathroom. Pt daughter is requesting PT services . CSW inquired if pt would like any DME like bedside commode or walker ,pt's daughter reported she does not need any equipment.CSW inquired if pt would like [place] services, pt's daughter reported she does not want her mother to go home in her condition. CSW attempted to explain SNF processes and it would need to be recommended by physical therapy , pt's daughter became upset and hung phone up.   ? ?1:10pm ?CSW spoke with MD , Physical Theapy hs been ordered.  ?CSW spoke with pt's daughter Maudie Mercury to inform her PT was order. CSW informed pt's daughter SNF would need to be recommended by PT and prior auth would need to be completed for pt to d/c to a facility. Pt's daughter was informed pt will need to remain in the ED while this process takes place. CSW informed pt's daughter due to pt being in ED , if SNF is reccommended she would have to choose from first available bed offers. Pt's daughter provided verbal understanding of conversation. TOC to follow. ? ?ED Mini Assessment: ?What brought you to the Emergency Department? : Abdominal Pain, Fall ? ?Barriers to Discharge: Continued Medical Work up ? ?  ? ?  ? ?Interventions which prevented an admission or readmission: DME Provided, Home Health Consult or Services, SNF Placement ? ? ? ?Patient Contact and Communications ?  ?  ?  ? ,     ?  ?  ? ?  ?  ?  ? ?Admission diagnosis:  Hip Pain, fall ?Patient Active Problem List  ? Diagnosis Date  Noted  ? Palliative care encounter 05/30/2021  ? Cognitive impairment 05/30/2021  ? Slurred speech 03/09/2021  ? Conductive hearing loss of both ears 09/17/2015  ? Dysfunction of both eustachian tubes 09/17/2015  ? Belching 04/20/2013  ? Nausea alone 11/02/2012  ? Unspecified constipation 11/02/2012  ? Dysuria 11/02/2012  ? Pyuria 03/20/2012  ? Knee pain 02/25/2012  ? Disturbance of skin sensation 02/25/2012  ? Aneurysm ([place]) 10/01/2011  ? Hyposmolality and/or hyponatremia 03/19/2011  ? Anemia, unspecified 03/19/2011  ? Aneurysm, 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/15/2011  ? UTI (lower urinary tract infection) 03/15/2011  ? Hypothyroidism 03/13/2011  ? Abdominal pain, other specified site 10/12/2010  ? Routine general medical examination at a health care facility 08/19/2010  ? Encounter for long-term (current) use of other medications 08/19/2010  ? Gross hematuria 08/19/2010  ? Numbness 08/19/2010  ? Unspecified disorder of liver 08/19/2010  ? UTI 03/31/2010  ? CHILLS WITHOUT FEVER 03/31/2010  ? Abdominal pain 08/18/2009  ? VITAMIN D DEFICIENCY 05/23/2008  ? Palpitations 05/23/2008  ? HYPERCHOLESTEROLEMIA 07/27/2007  ? Essential hypertension 07/27/2007  ? Anxiety with depression 12/06/2006  ? GERD 12/06/2006  ? DYSPEPSIA 12/06/2006  ? Diverticulosis of colon 12/06/2006  ? Irritable bowel syndrome 12/06/2006  ? ABDOMINAL PAIN, CHRONIC 12/06/2006  ? HYPERGLYCEMIA 12/06/2006  ? ?PCP:  Ginger Organ., MD ?Pharmacy:   ?[id number]  [place] [address] ?[place] [address] ?Phone: [phone] Fax: [phone] ?  ?

## 2021-07-11 NOTE — ED Triage Notes (Addendum)
Pt coming from home via EMS. Per EMS pt had a fall around 0300 this morning with left hip pain, but patient reports that she didn't fall, just almost fell. Pt c/o abdominal pain and bilateral foot pain with cramps x a few months getting worse.  ? ?50 mcg Fentanyl with EMS. Hx dementia. ?

## 2021-07-11 NOTE — ED Notes (Signed)
Patient was ambulated with minimal assistance, patient and daughter report to nurse that this is how she has been ambulating at home. Per daughter patient refuses to use cane or walker at home and has become more dependent on using furniture in her home to stabilize and get around.  ?

## 2021-07-11 NOTE — Evaluation (Signed)
Physical Therapy Evaluation ?Patient Details ?Name: Kara Davis ?MRN: [id number] ?DOB: 1934-08-14 ?Today's Date: 07/11/2021 ? ?History of Present Illness ? 87 yo female admitted to ED after sustaining a fall/near fall at home. Hx of dementia, anxiety, osteoporosis, IBS, depression, L opthalmic artery aneurysm.  ?Clinical Impression ? On eval, pt was Supv for mobility. She walked ~75 feet with a RW. Pt denied dizziness. She tolerated activity well. At baseline, per family report, pt "cruises/"furniture walks" around her home, so used a RW for ambulation safety. Pt has a rollator that she would not/does not use-she doesn't like the wheels. She required assistance and cueing for ADLs (toileting hygiene, clothing management). Daughter was present during session-family is requesting SNF placement-they are concerned about her being able to manage at home alone. Will continue to follow and progress mobility as able.  ?   ? ?Recommendations for follow up therapy are one component of a multi-disciplinary discharge planning process, led by the attending physician.  Recommendations may be updated based on patient status, additional functional criteria and insurance authorization. ? ?Follow Up Recommendations Skilled nursing-short term rehab (<3 hours/day) ? ?  ?Assistance Recommended at Discharge Frequent or constant Supervision/Assistance  ?Patient can return home with the following ? A little help with walking and/or transfers;A little help with bathing/dressing/bathroom;Assistance with cooking/housework;Assist for transportation;Help with stairs or ramp for entrance ? ?  ?Equipment Recommendations Rolling walker (2 wheels)  ?Recommendations for Other Services ?    ?  ?Functional Status Assessment Patient has had a recent decline in their functional status and demonstrates the ability to make significant improvements in function in a reasonable and predictable amount of time.  ? ?  ?Precautions / Restrictions  Precautions ?Precautions: Fall ?Precaution Comments: incontinent ?Restrictions ?Weight Bearing Restrictions: No  ? ?  ? ?Mobility ? Bed Mobility ?Overal bed mobility: Needs Assistance ?Bed Mobility: Supine to Sit, Sit to Supine ?  ?  ?Supine to sit: Supervision, HOB elevated ?Sit to supine: Supervision, HOB elevated ?  ?General bed mobility comments: Supv for safety, lines. Increased time. Cues provided. ?  ? ?Transfers ?Overall transfer level: Needs assistance ?Equipment used: Rolling walker (2 wheels) ?Transfers: Sit to/from Stand ?Sit to Stand: Supervision ?  ?  ?  ?  ?  ?General transfer comment: Supv for safety.Cues for safety ?  ? ?Ambulation/Gait ?Ambulation/Gait assistance: Supervision ?Gait Distance (Feet): 75 Feet ?Assistive device: Rolling walker (2 wheels) ?Gait Pattern/deviations: Step-through pattern ?  ?  ?  ?General Gait Details: Supv for safety. Pt denied dizziness. No LOB with RW use. Pt tolerated distance well. Cues for safety. ? ?Stairs ?  ?  ?  ?  ?  ? ?Wheelchair Mobility ?  ? ?Modified Rankin (Stroke Patients Only) ?  ? ?  ? ?Balance Overall balance assessment: Needs assistance, History of Falls ?  ?  ?  ?  ?Standing balance support: Bilateral upper extremity supported, During functional activity, Reliant on assistive device for balance ?Standing balance-Leahy Scale: Fair ?  ?  ?  ?  ?  ?  ?  ?  ?  ?  ?  ?  ?   ? ? ? ?Pertinent Vitals/Pain Pain Assessment ?Pain Assessment: No/denies pain  ? ? ?Home Living Family/patient expects to be discharged to:: Private residence ?Living Arrangements: Alone ?Available Help at Discharge: Family ?Type of Home: House ?Home Access: Stairs to enter ?Entrance Stairs-Rails: Right;Left;Can reach both ?Entrance Stairs-Number of Steps: 3 ?  ?Home Layout: Two level;1/2 bath on main level;Able to  live on main level with bedroom/bathroom ?Home Equipment: Rollator (4 wheels);Hand held shower head;Shower seat ?   ?  ?Prior Function Prior Level of Function :  Independent/Modified Independent ?  ?  ?  ?  ?  ?  ?  ?ADLs Comments: Pt's daughter comes by every day or every other day and does pt's laundry as well as heavier house cleaning. Pt does drive, manages her own medications.  Per daughter, pt is stubbornly independent and resistant to leaving her townhome. Daughter has asked pt to move in with her but pt has declined. ?  ? ? ?Hand Dominance  ?   ? ?  ?Extremity/Trunk Assessment  ? Upper Extremity Assessment ?Upper Extremity Assessment: Defer to OT evaluation ?  ? ?Lower Extremity Assessment ?Lower Extremity Assessment: Generalized weakness ?  ? ?Cervical / Trunk Assessment ?Cervical / Trunk Assessment: Normal  ?Communication  ? Communication: No difficulties  ?Cognition Arousal/Alertness: Awake/alert ?Behavior During Therapy: [facility] for tasks assessed/performed ?Overall Cognitive Status: History of cognitive impairments - at baseline ?  ?  ?  ?  ?  ?  ?  ?  ?  ?  ?  ?  ?  ?  ?  ?  ?  ?  ?  ? ?  ?General Comments   ? ?  ?Exercises    ? ?Assessment/Plan  ?  ?PT Assessment Patient needs continued PT services  ?PT Problem List Decreased strength;Decreased mobility;Decreased activity tolerance;Decreased knowledge of use of DME;Decreased balance;Decreased cognition;Decreased safety awareness ? ?   ?  ?PT Treatment Interventions DME instruction;Gait training;Therapeutic activities;Therapeutic exercise;Patient/family education;Balance training;Functional mobility training   ? ?PT Goals (Current goals can be found in the Care Plan section)  ?Acute Rehab PT Goals ?Patient Stated Goal: family is requesting SNF placement ?PT Goal Formulation: With patient/family ?Time For Goal Achievement: 07/25/21 ?Potential to Achieve Goals: Good ? ?  ?Frequency Min 2X/week ?  ? ? ?Co-evaluation   ?  ?  ?  ?  ? ? ?  ?AM-PAC PT "6 Clicks" Mobility  ?Outcome Measure Help needed turning from your back to your side while in a flat bed without using bedrails?: A Little ?Help needed moving from lying on  your back to sitting on the side of a flat bed without using bedrails?: A Little ?Help needed moving to and from a bed to a chair (including a wheelchair)?: A Little ?Help needed standing up from a chair using your arms (e.g., wheelchair or bedside chair)?: A Little ?Help needed to walk in hospital room?: A Little ?Help needed climbing 3-5 steps with a railing? : A Little ?6 Click Score: 18 ? ?  ?End of Session Equipment Utilized During Treatment: Gait belt ?Activity Tolerance: Patient tolerated treatment well ?Patient left: in bed;with call bell/phone within reach;with family/visitor present ?  ?PT Visit Diagnosis: Unsteadiness on feet (R26.81);History of falling (Z91.81);Muscle weakness (generalized) (M62.81) ?  ? ?Time: 6381-7711 ?PT Time Calculation (min) (ACUTE ONLY): 35 min ? ? ?Charges:   PT Evaluation ?$PT Eval Moderate Complexity: 1 Mod ?PT Treatments ?$Gait Training: 8-22 mins ?  ?   ? ? ? ? ? ?Doreatha Massed, PT ?Acute Rehabilitation  ?Office: [phone] ?Pager: [phone] ? ?  ?

## 2021-07-11 NOTE — ED Provider Notes (Signed)
?[facility] DEPT ?Provider Note ? ? ?CSN: [id number] ?Arrival date & time: 07/11/21  4193 ? ?  ? ?History ? ?Chief Complaint  ?Patient presents with  ?? Abdominal Pain  ?? Fall  ? ? ?Kara Davis is a 87 y.o. female. ? ? ?Abdominal Pain ?Fall ?Associated symptoms include abdominal pain.  ? ?Patient has history of hypothyroidism, anxiety, and irritable bowel syndrome, hyperglycemia, UTI, who presents with complaints of weakness, cramping and abdominal pain.  Patient states she has history of irritable bowel syndrome.  She often has loose stools.  She has maybe 5 episodes per day.  Last evening she was trying to walk to the bathroom.  She was afraid that she was going to feel weak so she did not necessarily want to go but she had a significant urge and had to go to the bathroom.  Patient states she did not make it to the bathroom and soiled herself.  She ended up getting back to the bed on her own and denies actually falling.  Ended up calling EMS and they brought her to the ED.  Apparently she was complaining of some hip pain at that time and she was given 50 mcg of fentanyl.  Patient however denies to me that she actually fell.  Her primary complaint was cramping in her right toe.  She does have bunions but denies any injuries or trauma to her toe. ? ?Prior records reviewed.  Patient was recently seen in the emergency room on April 3.  She was diagnosed with a UTI.  During that visit the patient did have a CT scan.  Those findings were reviewed.  No acute abnormalities were noted.  Patient had a follow-up visit with her GI doctor on April 4.  Results of that visit reviewed ? ?Home Medications ?Prior to Admission medications   ?Medication Sig Start Date End Date Taking? Authorizing Provider  ?ALPRAZolam (XANAX) 0.25 MG tablet TAKE 1 TABLET BY MOUTH 3 TIMES DAILY AS NEEDED FOR ANXIETY. **[facility]** 05/18/21   Cottle, Billey Co., MD  ?cephALEXin (KEFLEX) 500 MG capsule Take 1 capsule  (500 mg total) by mouth 2 (two) times daily. 07/06/21   Malvin Johns, MD  ?Cholecalciferol (VITAMIN D-3) 25 MCG (1000 UT) CAPS Take 1,000 Units by mouth every 7 (seven) days.    [provider]  ?HYDROcodone-acetaminophen (NORCO/VICODIN) 5-325 MG tablet Take 1 tablet by mouth every 4-6 hours as needed for abdominal pain ?Patient taking differently: Take 0.125-0.25 tablets by mouth See admin instructions. Take 0.125-0.25 tablet by mouth at bedtime- EVERY OTHER NIGHT 03/14/15   Mauri Pole, MD  ?levothyroxine (SYNTHROID) 75 MCG tablet Take 75 mcg by mouth daily before breakfast. 11/06/18   [provider]  ?mupirocin cream (BACTROBAN) 2 % Apply 1 application. topically 2 (two) times daily. 07/06/21   Malvin Johns, MD  ?ondansetron (ZOFRAN-ODT) 4 MG disintegrating tablet Take 1 tablet (4 mg total) by mouth daily as needed for nausea or vomiting. 07/07/21   Mauri Pole, MD  ?sucralfate (CARAFATE) 1 GM/10ML suspension TAKE 10 MLS BY MOUTH 4 TIMES DAILY - WITH MEALS AND AT BEDTIME. 07/09/21   Mauri Pole, MD  ?   ? ?Allergies    ?Patient has no known allergies.   ? ?Review of Systems   ?Review of Systems  ?Gastrointestinal:  Positive for abdominal pain.  ? ?Physical Exam ?Updated Vital Signs ?BP (!) 143/55 (BP Location: Right Arm)   Pulse 73   Temp 98.1 ?  F (36.7 ?C) (Oral)   Resp 17   Ht 1.575 m ('5\' 2"'$ )   Wt 50.8 kg   SpO2 96%   BMI 20.49 kg/m?  ?Physical Exam ?Vitals and nursing note reviewed.  ?Constitutional:   ?   Appearance: She is well-developed.  ?HENT:  ?   Head: Normocephalic and atraumatic.  ?   Right Ear: External ear normal.  ?   Left Ear: External ear normal.  ?Eyes:  ?   General: No scleral icterus.    ?   Right eye: No discharge.     ?   Left eye: No discharge.  ?   Conjunctiva/sclera: Conjunctivae normal.  ?Neck:  ?   Trachea: No tracheal deviation.  ?Cardiovascular:  ?   Rate and Rhythm: Normal rate and regular rhythm.  ?Pulmonary:  ?   Effort: Pulmonary effort is  normal. No respiratory distress.  ?   Breath sounds: Normal breath sounds. No stridor. No wheezing or rales.  ?Abdominal:  ?   General: Bowel sounds are normal. There is no distension.  ?   Palpations: Abdomen is soft.  ?   Tenderness: There is generalized abdominal tenderness. There is no guarding or rebound.  ?Musculoskeletal:     ?   General: No tenderness or deformity.  ?   Cervical back: Neck supple.  ?   Comments: Bunions noted bilaterally, no erythema no skin ulcerations, able to extend both hips without pain, no ttp hip pelvis  ?Skin: ?   General: Skin is warm and dry.  ?   Findings: No rash.  ?Neurological:  ?   General: No focal deficit present.  ?   Mental Status: She is alert.  ?   Cranial Nerves: No cranial nerve deficit (no facial droop, extraocular movements intact, no slurred speech).  ?   Sensory: No sensory deficit.  ?   Motor: No abnormal muscle tone or seizure activity.  ?   Coordination: Coordination normal.  ?Psychiatric:     ?   Mood and Affect: Mood is depressed.  ? ? ?ED Results / Procedures / Treatments   ?Labs ?(all labs ordered are listed, but only abnormal results are displayed) ?Labs Reviewed  ?COMPREHENSIVE METABOLIC PANEL - Abnormal; Notable for the following components:  ?    Result Value  ? Sodium 134 (*)   ? Calcium 7.8 (*)   ? Total Protein 5.8 (*)   ? Albumin 3.2 (*)   ? AST 49 (*)   ? ALT 90 (*)   ? All other components within normal limits  ?CBC WITH DIFFERENTIAL/PLATELET - Abnormal; Notable for the following components:  ? Lymphs Abs 0.6 (*)   ? All other components within normal limits  ?URINALYSIS, ROUTINE W REFLEX MICROSCOPIC - Abnormal; Notable for the following components:  ? Ketones, ur 80 (*)   ? All other components within normal limits  ?LIPASE, BLOOD  ?TSH  ? ? ?EKG ?None ? ?Radiology ?DG Abd Acute W/Chest ? ?Result Date: 07/11/2021 ?CLINICAL DATA:  Abdominal pain, fall this morning EXAM: DG ABDOMEN ACUTE WITH 1 VIEW CHEST COMPARISON:  None. FINDINGS: Gas-filled,  nondistended loops of bowel throughout the abdomen and pelvis, with gas and stool present to the rectum. Moderate burden of stool in the distal colon. No radiopaque calculi or other significant radiographic abnormality is seen. Mild cardiomegaly. Both lungs are clear. IMPRESSION: 1.  No acute abnormality of the lungs in AP portable projection. 2. Nonobstructive pattern of bowel gas with gas and stool  present to the rectum. Moderate burden of stool in distal colon. No free air. Electronically Signed   By: Delanna Ahmadi M.D.   On: 07/11/2021 08:34  ? ?DG Hips Bilat W or Wo Pelvis 2 Views ? ?Result Date: 07/11/2021 ?CLINICAL DATA:  87 year old female with history of trauma from a fall presenting with left hip pain. EXAM: DG HIP (WITH OR WITHOUT PELVIS) 2V BILAT COMPARISON:  No priors. FINDINGS: There is no evidence of hip fracture or dislocation. Joint space narrowing, subchondral sclerosis, subchondral cyst formation and osteophyte formation is noted in the hip joints bilaterally, indicative of osteoarthritis. IMPRESSION: 1. No acute radiographic abnormality of the bony pelvis or either hip. 2. Moderate bilateral hip joint osteoarthritis. Electronically Signed   By: Vinnie Langton M.D.   On: 07/11/2021 08:33   ? ?Procedures ?Procedures  ? ? ?Medications Ordered in ED ?Medications  ?sodium chloride 0.9 % bolus 500 mL (0 mLs Intravenous Stopped 07/11/21 0900)  ?  And  ?0.9 %  sodium chloride infusion ( Intravenous New Bag/Given 07/11/21 0928)  ?cephALEXin (KEFLEX) capsule 500 mg (has no administration in time range)  ?levothyroxine (SYNTHROID) tablet 75 mcg (has no administration in time range)  ?ondansetron (ZOFRAN-ODT) disintegrating tablet 4 mg (has no administration in time range)  ?sucralfate (CARAFATE) 1 GM/10ML suspension 1 g (has no administration in time range)  ?HYDROcodone-acetaminophen (NORCO/VICODIN) 5-325 MG per tablet 0.5 tablet (has no administration in time range)  ?acetaminophen (TYLENOL) tablet 325 mg  (has no administration in time range)  ?ondansetron [facility]) injection 4 mg (4 mg Intravenous Given 07/11/21 0756)  ?morphine (PF) 2 MG/ML injection 2 mg (2 mg Intravenous Given 07/11/21 0856)  ? ? ?ED Course/ Medical Dec

## 2021-07-15 ENCOUNTER — Ambulatory Visit (HOSPITAL_COMMUNITY): Payer: Medicare HMO

## 2021-08-03 ENCOUNTER — Other Ambulatory Visit: Payer: Self-pay | Admitting: Gastroenterology

## 2021-10-07 DIAGNOSIS — E039 Hypothyroidism, unspecified: Secondary | ICD-10-CM | POA: Diagnosis not present

## 2021-10-07 DIAGNOSIS — I10 Essential (primary) hypertension: Secondary | ICD-10-CM | POA: Diagnosis not present

## 2021-10-07 DIAGNOSIS — E785 Hyperlipidemia, unspecified: Secondary | ICD-10-CM | POA: Diagnosis not present

## 2021-10-07 DIAGNOSIS — M810 Age-related osteoporosis without current pathological fracture: Secondary | ICD-10-CM | POA: Diagnosis not present

## 2021-10-07 DIAGNOSIS — R7301 Impaired fasting glucose: Secondary | ICD-10-CM | POA: Diagnosis not present

## 2021-10-13 DIAGNOSIS — R7301 Impaired fasting glucose: Secondary | ICD-10-CM | POA: Diagnosis not present

## 2021-10-13 DIAGNOSIS — M791 Myalgia, unspecified site: Secondary | ICD-10-CM | POA: Diagnosis not present

## 2021-10-13 DIAGNOSIS — R1084 Generalized abdominal pain: Secondary | ICD-10-CM | POA: Diagnosis not present

## 2021-10-13 DIAGNOSIS — E785 Hyperlipidemia, unspecified: Secondary | ICD-10-CM | POA: Diagnosis not present

## 2021-10-13 DIAGNOSIS — I129 Hypertensive chronic kidney disease with stage 1 through stage 4 chronic kidney disease, or unspecified chronic kidney disease: Secondary | ICD-10-CM | POA: Diagnosis not present

## 2021-10-13 DIAGNOSIS — I10 Essential (primary) hypertension: Secondary | ICD-10-CM | POA: Diagnosis not present

## 2021-10-13 DIAGNOSIS — N182 Chronic kidney disease, stage 2 (mild): Secondary | ICD-10-CM | POA: Diagnosis not present

## 2021-10-13 DIAGNOSIS — R69 Illness, unspecified: Secondary | ICD-10-CM | POA: Diagnosis not present

## 2021-10-13 DIAGNOSIS — M810 Age-related osteoporosis without current pathological fracture: Secondary | ICD-10-CM | POA: Diagnosis not present

## 2021-10-13 DIAGNOSIS — E039 Hypothyroidism, unspecified: Secondary | ICD-10-CM | POA: Diagnosis not present

## 2021-10-13 DIAGNOSIS — Z0000 Encounter for general adult medical examination without abnormal findings: Secondary | ICD-10-CM | POA: Diagnosis not present

## 2021-10-13 DIAGNOSIS — I708 Atherosclerosis of other arteries: Secondary | ICD-10-CM | POA: Diagnosis not present

## 2021-11-10 ENCOUNTER — Ambulatory Visit: Payer: Medicare Other | Admitting: Psychiatry

## 2021-12-01 ENCOUNTER — Telehealth: Payer: Self-pay | Admitting: Psychiatry

## 2021-12-01 ENCOUNTER — Other Ambulatory Visit: Payer: Self-pay

## 2021-12-01 DIAGNOSIS — F5105 Insomnia due to other mental disorder: Secondary | ICD-10-CM

## 2021-12-01 DIAGNOSIS — F411 Generalized anxiety disorder: Secondary | ICD-10-CM

## 2021-12-01 MED ORDER — ALPRAZOLAM 0.25 MG PO TABS: ORAL_TABLET | ORAL | 1 refills | Status: DC

## 2021-12-01 NOTE — Telephone Encounter (Signed)
Next visit is 0.25 mg. [name], Kara Davis's daughter, called (Listed on [place]) stating her mom needs a refill on her Xanax 0.25 mg called to:  [id number] [place] [address] Phone:  [phone] Fax:  [phone]  If any questions please call daughter KClois Comberat [phone]

## 2021-12-01 NOTE — Telephone Encounter (Signed)
Pended.

## 2021-12-30 ENCOUNTER — Other Ambulatory Visit: Payer: Medicare HMO | Admitting: Family Medicine

## 2021-12-30 ENCOUNTER — Encounter: Payer: Self-pay | Admitting: Family Medicine

## 2021-12-30 ENCOUNTER — Telehealth: Payer: Self-pay

## 2021-12-30 VITALS — BP 108/58 | HR 71 | Resp 18

## 2021-12-30 DIAGNOSIS — Z636 Dependent relative needing care at home: Secondary | ICD-10-CM

## 2021-12-30 DIAGNOSIS — R69 Illness, unspecified: Secondary | ICD-10-CM | POA: Diagnosis not present

## 2021-12-30 DIAGNOSIS — K5900 Constipation, unspecified: Secondary | ICD-10-CM | POA: Diagnosis not present

## 2021-12-30 DIAGNOSIS — R4189 Other symptoms and signs involving cognitive functions and awareness: Secondary | ICD-10-CM

## 2021-12-30 NOTE — Progress Notes (Signed)
[facility] Palliative Care Consult Note Telephone: [phone]  Fax: [phone]    Date of encounter: 12/30/21 1:11 PM PATIENT NAME: Kara Davis [id number]   [phone] (home) [phone] (work) DOB: 03-03-1935 MRN: [id number] PRIMARY CARE PROVIDER:    Ginger Organ., MD,  [place] [address] [phone]  REFERRING PROVIDER:   Ginger Organ., MD [place],  [place] [address] [phone]  RESPONSIBLE PARTY:    Contact Information     Name Relation Home Work Mobile   Jerilynn Mages Daughter [phone]         I met face to face with patient and her daughter in patient's home. Palliative Care was asked to follow this patient by consultation request of  Ginger Organ., MD to address advance care planning and complex medical decision making. This is a follow up visit   ASSESSMENT , SYMPTOM MANAGEMENT AND PLAN / RECOMMENDATIONS:   Cognitive impairment with behavioral disturbance Seroquel 25 mg QHS for mood and sleep Can increase Melatonin dose to 6 mg QHS   Constipation Miralax 17 gm daily. Increase fluids.  3.  Caregiver stress Advised daughter if pt has a 3 day admission to the hospital that she may qualify for short term rehab. Encouraged daughter to investigate facilities that accepted Long Term Medicaid in case needed for pt's care. Encouraged her to go to DSS to see what would be involved in placement for pt in long term memory care if needed. Advised that it may require a spend down of her assets before she would qualify.  Advance Care Planning/Goals of Care: Goals include to maximize quality of life and symptom management.  Exploration of goals of care in the event of a sudden injury or illness -DNR Identification of a healthcare agent-daughter Kara Davis Review of an advance directive document. DNR Decision not to resuscitate  or to de-escalate disease focused treatments due to poor prognosis. CODE STATUS: DNR     Follow up Palliative Care Visit: Patient remains stable with slow decline but continues to eat and do some self care.  She is currently not close to a Hospice level of care and will be d/c'd from Palliative Services.  Her daughter can request re-evaluation if there is a significant decline in function.    This visit was coded based on medical decision making (MDM).  PPS: 60%  HOSPICE ELIGIBILITY/DIAGNOSIS: TBD  Chief Complaint:  Palliative Care is following pt for chronic medical management in setting of dementia.  HISTORY OF PRESENT ILLNESS:  Kara Davis is a 87 y.o. year old female with dementia with behavioral disturbance now exhibiting signs of paranoia with caregiver, calling daughter 40-60 times per night and being resistive to care. Pt also has ophthalmic artery aneurysm, HTN, GERD, indigestion, diverticulosis, IBS,  hypothyroidism, anxiety with depression, vitamin D deficiency, abd pain,  nausea, constipation, slurred speech.  Daughter has hired a caregiver during the day but pt remains suspicious and accuses her of taking things.  She also states "My daughter thinks I am losing my mind."  Pt insists she was recently seen and treated for Covid.  Daughter states she put on her coat a few days ago and walked out to her car to go to the store but the car wouldn't start (Family has disabled her car to prevent her driving.)  She states she is not sleeping at night and remains tired during  the day.  Dozing a little during the day.  [name] not having hallucinations or wandering.  Not eating as much but taking Ensure BID.  Last fall in last 2 months with no injury.  Has some nausea on occasion but no vomiting.  Denies dysuria.  Has to strain a little to have a BM, denies bright red or dark/tarry stools. Denies coughing/choking or trouble taking pills.  Had some cramping in her abd or toes.  Trying to eat  fiber and yogurt with probiotics. Pt knows we are in [place], today is Tuesday or Wednesday, 2023.    History obtained from review of EMR, discussion with family, pid caregiver and/or Ms. Espiritu.    I reviewed EMR for available labs, medications, imaging, studies and related documents.  There are no new records since last visit.Marland Kitchen   ROS General: NAD EYES: denies vision changes ENMT: denies dysphagia Cardiovascular: denies chest pain, denies DOE Pulmonary: denies cough, denies increased SOB Abdomen: endorses fair appetite, endorses constipation, endorses continence of bowel GU: denies dysuria, endorses continence of urine MSK:  Endorses generalized weakness,  no falls reported Skin: denies rashes or wounds Neurological: denies pain, endorses insomnia Psych: Endorses positive mood Heme/lymph/immuno: denies bruises, abnormal bleeding  Physical Exam: Current and past weights: 107 lbs in July 2023, 112 lbs as of 07/11/21 Constitutional: NAD General: WN, mildly disheveled ENMT: hard of hearing CV: S1S2, RRR, no LE edema Pulmonary: CTAB, no increased work of breathing, no cough, room air Abdomen:  hypo-active BS + 4 quadrants, soft and non tender, no ascites GU: deferred MSK: no sarcopenia, moves all extremities, ambulatory Skin: warm and dry, no rashes or wounds on visible skin Neuro:  no generalized weakness,  no cognitive impairment Psych: non-anxious affect, A and O x 2 Hem/lymph/immuno: no widespread bruising   Thank you for the opportunity to participate in the care of Kara Davis.  The palliative care team will continue to follow. Please call our office at [phone] if we can be of additional assistance.   Marijo Conception, FNP -C  COVID-19 PATIENT SCREENING TOOL Asked and negative response unless otherwise noted:   Have you had symptoms of covid, tested positive or been in contact with someone with symptoms/positive test in the past 5-10 days? No

## 2021-12-30 NOTE — Telephone Encounter (Signed)
Late Entry for 12/29/21.  Message received from daughter Maudie Mercury.  Patient's dementia has progressed and she is requiring supervision most of the time now.  Patient is refusing to go to doctor's appointments.  Daughter would like to have a follow up visit with NP.  We discussed home based providers as patient is refusing to leave her home.  Resources provided.  Damaris Hippo, NP to see patient on 12/30/21 @ 1 pm.

## 2022-01-02 ENCOUNTER — Encounter: Payer: Self-pay | Admitting: Family Medicine

## 2022-01-03 DIAGNOSIS — Z636 Dependent relative needing care at home: Secondary | ICD-10-CM | POA: Insufficient documentation

## 2022-01-12 ENCOUNTER — Emergency Department (HOSPITAL_BASED_OUTPATIENT_CLINIC_OR_DEPARTMENT_OTHER)
Admission: EM | Admit: 2022-01-12 | Discharge: 2022-01-12 | Disposition: A | Discharge status: Home / Self Care | Payer: Medicare HMO | Attending: Emergency Medicine | Admitting: Emergency Medicine

## 2022-01-12 ENCOUNTER — Encounter (HOSPITAL_BASED_OUTPATIENT_CLINIC_OR_DEPARTMENT_OTHER): Payer: Self-pay | Admitting: Emergency Medicine

## 2022-01-12 ENCOUNTER — Emergency Department (HOSPITAL_BASED_OUTPATIENT_CLINIC_OR_DEPARTMENT_OTHER): Payer: Medicare HMO

## 2022-01-12 ENCOUNTER — Telehealth: Payer: Self-pay

## 2022-01-12 ENCOUNTER — Other Ambulatory Visit (HOSPITAL_BASED_OUTPATIENT_CLINIC_OR_DEPARTMENT_OTHER): Payer: Self-pay

## 2022-01-12 ENCOUNTER — Other Ambulatory Visit: Payer: Self-pay

## 2022-01-12 DIAGNOSIS — Z79899 Other long term (current) drug therapy: Secondary | ICD-10-CM | POA: Diagnosis not present

## 2022-01-12 DIAGNOSIS — S99921A Unspecified injury of right foot, initial encounter: Secondary | ICD-10-CM | POA: Diagnosis not present

## 2022-01-12 DIAGNOSIS — E039 Hypothyroidism, unspecified: Secondary | ICD-10-CM | POA: Diagnosis not present

## 2022-01-12 DIAGNOSIS — M79672 Pain in left foot: Secondary | ICD-10-CM | POA: Diagnosis not present

## 2022-01-12 DIAGNOSIS — F0390 Unspecified dementia without behavioral disturbance: Secondary | ICD-10-CM | POA: Diagnosis not present

## 2022-01-12 DIAGNOSIS — M79671 Pain in right foot: Secondary | ICD-10-CM | POA: Diagnosis not present

## 2022-01-12 DIAGNOSIS — L84 Corns and callosities: Secondary | ICD-10-CM | POA: Diagnosis not present

## 2022-01-12 DIAGNOSIS — G8929 Other chronic pain: Secondary | ICD-10-CM | POA: Insufficient documentation

## 2022-01-12 DIAGNOSIS — R69 Illness, unspecified: Secondary | ICD-10-CM | POA: Diagnosis not present

## 2022-01-12 MED ORDER — CLOTRIMAZOLE 1 % EX OINT
2.0000 g | TOPICAL_OINTMENT | Freq: Every day | CUTANEOUS | 0 refills | Status: DC
  Filled 2022-01-12: qty 56.7, fill #0

## 2022-01-12 MED ORDER — CEPHALEXIN 500 MG PO CAPS
500.0000 mg | ORAL_CAPSULE | Freq: Two times a day (BID) | ORAL | 0 refills | Status: AC
  Filled 2022-01-12: qty 10, 5d supply, fill #0

## 2022-01-12 NOTE — ED Triage Notes (Signed)
Pt arrives to ED with c/o foot pain, cramps, and possible toe infection.

## 2022-01-12 NOTE — ED Provider Notes (Signed)
[facility] EMERGENCY DEPT Provider Note   CSN: [id number] Arrival date & time: 01/12/22  1155     History Chief Complaint  Patient presents with   Foot Pain    Kara Davis is a 87 y.o. female with history of dementia, hypothyroidism presents the emergency department for evaluation of of bilateral feet pain.  She comes accompanied by daughter who provides the majority of the history.  Her daughter reports that she has had foot cramps and pain for years.  She reports that she has been having bilateral feet pain for the past few weeks and it hurts whenever she is walking.  She has not seen a podiatrist in a year.  The daughter reports that she is scheduled multiple appointments but the patient refused to go.  The patient currently lives by herself with a home aide.  Unknown if any medications were trialed prior to arrival.  Denies any numbness or tingling.   Foot Pain       Home Medications Prior to Admission medications   Medication Sig Start Date End Date Taking? Authorizing Provider  cephALEXin (KEFLEX) 500 MG capsule Take 1 capsule (500 mg total) by mouth 2 (two) times daily for 5 days. 01/12/22 01/17/22 Yes Sherrell Puller, PA-C  Clotrimazole 1 % OINT Apply 2 g topically daily. 01/12/22  Yes Sherrell Puller, PA-C  ALPRAZolam (XANAX) 0.25 MG tablet TAKE 1 TABLET BY MOUTH 3 TIMES DAILY AS NEEDED FOR ANXIETY. **[facility]** 12/01/21   Cottle, Billey Co., MD  Cholecalciferol (VITAMIN D-3) 25 MCG (1000 UT) CAPS Take 1,000 Units by mouth daily.    [provider]  Homeopathic Products [facility] DRY EYE RELIEF) SOLN Place 1 drop into both eyes daily.    [provider]  HYDROcodone-acetaminophen (NORCO/VICODIN) 5-325 MG tablet Take 1 tablet by mouth every 4-6 hours as needed for abdominal pain Patient not taking: Reported on 07/11/2021 03/14/15   Mauri Pole, MD  levothyroxine (SYNTHROID) 75 MCG tablet Take 75 mcg by mouth daily before breakfast.  11/06/18   [provider]  melatonin 5 MG TABS Take 5 mg by mouth at bedtime as needed (sleep).    [provider]  Multiple Vitamin (MULTIVITAMIN WITH MINERALS) TABS tablet Take 1 tablet by mouth daily.    [provider]  mupirocin cream (BACTROBAN) 2 % Apply 1 application. topically 2 (two) times daily. Patient not taking: Reported on 07/11/2021 07/06/21   Malvin Johns, MD  ondansetron (ZOFRAN-ODT) 4 MG disintegrating tablet DISSOLVE 1 TABLET ON TONGUE DAILY AS NEEDED FOR NAUSEA OR VOMITING. 08/03/21   Mauri Pole, MD  QUEtiapine (SEROQUEL) 25 MG tablet Take 25 mg by mouth at bedtime. 12/30/21   [provider]  Sodium Chloride-Xylitol (XLEAR SINUS CARE SPRAY) SOLN Place 1 spray into both nostrils daily.    [provider]  sucralfate (CARAFATE) 1 GM/10ML suspension TAKE 10 MLS BY MOUTH 4 TIMES DAILY - WITH MEALS AND AT BEDTIME. Patient not taking: Reported on 07/11/2021 07/09/21   Mauri Pole, MD      Allergies    Patient has no known allergies.    Review of Systems   Review of Systems  Constitutional:  Negative for chills and fever.  Musculoskeletal:  Positive for myalgias.    Physical Exam Updated Vital Signs BP (!) 150/63   Pulse 66   Temp 98.4 F (36.9 C) (Oral)   Resp 15   Ht '5\' 2"'$  (1.575 m)   Wt 48.5 kg  SpO2 96%   BMI 19.57 kg/m  Physical Exam Vitals and nursing note reviewed.  Constitutional:      General: She is not in acute distress.    Appearance: Normal appearance. She is not ill-appearing or toxic-appearing.  HENT:     Mouth/Throat:     Comments: Poor dentition Eyes:     General: No scleral icterus. Pulmonary:     Effort: Pulmonary effort is normal. No respiratory distress.  Skin:    General: Skin is dry.     Findings: No rash.     Comments: Palpable pulses to the bilateral feet.  There is some arthritic changes noted.  Patient has overgrown toenails on toes 1 through 5.  There is large amount of  corns and skin growths noted to the plantar and dorsum of the bilateral feet.  Please see images.  Patient is missing her right great toe nail.  There is some erythema, however minimal increased temperature.  No purulent discharge noted.  Compartments are soft.  No feet swelling noted.  I do not see any open wounds or macerations.  Patient does have dry growths between all of her toes on bilateral feet.  Neurological:     General: No focal deficit present.     Mental Status: She is alert. Mental status is at baseline.  Psychiatric:        Mood and Affect: Mood normal.     ED Results / Procedures / Treatments   Labs (all labs ordered are listed, but only abnormal results are displayed) Labs Reviewed - No data to display  EKG None  Radiology DG Foot Complete Right  Result Date: 01/12/2022 CLINICAL DATA:  Positive great toe injury. Great toe medial toenail erythema. EXAM: RIGHT FOOT COMPLETE - 3+ VIEW COMPARISON:  None Available. FINDINGS: Diffuse decreased bone mineralization. Moderate to high-grade hallux valgus. Moderate great toe metatarsophalangeal joint space narrowing and peripheral osteophytosis. Within the limitations of great toe metatarsophalangeal extensor positioning and bone overlap, no definite acute fracture is identified. IMPRESSION: 1. Within the limitations of diffuse decreased bone mineralization, no definite acute fracture is identified. 2. Moderate to high-grade hallux valgus and moderate great toe metatarsophalangeal osteoarthritis. Electronically Signed   By: Yvonne Kendall M.D.   On: 01/12/2022 13:20    Procedures Procedures   Medications Ordered in ED Medications - No data to display  ED Course/ Medical Decision Making/ A&P Clinical Course as of 01/12/22 1348  Tue Jan 12, 2022  1226 BL LE foot pain. Podiatry [CC]    Clinical Course User Index [CC] Tretha Sciara, MD                           Medical Decision Making Amount and/or Complexity of Data  Reviewed Radiology: ordered.  Risk OTC drugs. Prescription drug management.   87 y/o F presents to the ED for evaluation of chronic bilateral foot pain.  Differential diagnosis includes is limited to fungal infection, corns, calluses, cellulitis.  Vital signs show slightly blood pressure 1 otherwise afebrile, normal pulse rate, satting well on room air with any increased work of breathing.  Physical exam as noted above.  Please see pictures.  On chart investigation, it appears that patient's last visit with the podiatrist was on 03-12-2021.  Per the provider's note at [facility], she has painful thick toenails that are difficult to trim and interferes with her ambulation.  She has relieved pain with periodic professional debridement.  Diagnosed with chronic mycotic nail infection in toenails 1 through 5 in both feet with pain.  We will order x-ray of the right foot to rule out any toe fracture on the right although patient denies that there was any trauma to the area, she is demented.  Otherwise, I do not see any lab work needed.  Patient does not meet any SIRS criteria.  X-ray shows  1. Within the limitations of diffuse decreased bone mineralization, no definite acute fracture is identified. 2. Moderate to high-grade hallux valgus and moderate great toe metatarsophalangeal osteoarthritis.  Given this, we will start the patient on some Keflex twice daily 5 mg the next 5 days as well as some clotrimazole ointment.  Ultimately, patient will need to follow-up with a podiatrist for treatment and for reevaluation.  Discussed with daughter at bedside treatment plans and need for podiatry follow-up.  We discussed return precautions and red flag symptoms.  Patient and having member verbalized understanding agrees with plan.  Patient is stable be discharged home in good condition.  I discussed this case with my attending physician who cosigned this note including patient's presenting  symptoms, physical exam, and planned diagnostics and interventions. Attending physician stated agreement with plan or made changes to plan which were implemented.   Attending physician assessed patient at bedside.  Final Clinical Impression(s) / ED Diagnoses Final diagnoses:  Chronic pain of both feet  Corn of foot    Rx / DC Orders ED Discharge Orders          Ordered    cephALEXin (KEFLEX) 500 MG capsule  2 times daily        01/12/22 1255    Clotrimazole 1 % OINT  Daily        01/12/22 1255              Sherrell Puller, PA-C 01/12/22 1355    Tretha Sciara, MD 01/13/22 [phone]

## 2022-01-12 NOTE — Discharge Instructions (Addendum)
You were seen in the ER for evaluation of your feet pain. Your pain is likely from the overgrowth of skin and toenails on your feet. For this you will need to see a podiatrist. I am sending you home with an antibiotic to take twice daily for the next five days in addition to some antifungal cream to apply to both feet daily. Please make sure you call the podiatrist to schedule an appointment. If you have any concerns, new or worsening symptoms, please return to the ER for evaluation.   Contact a health care provider if: Your symptoms do not improve with treatment. You have redness or swelling that gets worse. Your corn or callus becomes painful. You have fluid, blood, or pus coming from your corn or callus. You have new symptoms. Get help right away if: You develop severe pain with redness.

## 2022-01-13 ENCOUNTER — Ambulatory Visit: Payer: Medicare HMO | Admitting: Podiatry

## 2022-01-13 DIAGNOSIS — M79675 Pain in left toe(s): Secondary | ICD-10-CM | POA: Diagnosis not present

## 2022-01-13 DIAGNOSIS — B351 Tinea unguium: Secondary | ICD-10-CM | POA: Diagnosis not present

## 2022-01-13 DIAGNOSIS — M79674 Pain in right toe(s): Secondary | ICD-10-CM

## 2022-01-13 DIAGNOSIS — L97522 Non-pressure chronic ulcer of other part of left foot with fat layer exposed: Secondary | ICD-10-CM

## 2022-01-13 NOTE — Progress Notes (Signed)
   Chief Complaint  Patient presents with   Nail Problem    Patient is her for toe nail fungus, she is currently on antibiotics, she was seen at the [facility] for this issues and was advised to be seen by podiatry.patient states that her toe nails are very sensitive and would like something for pain.   Callouses    SUBJECTIVE Patient  presents to office today complaining of elongated, thickened nails that cause pain while ambulating in shoes.  Patient is unable to trim their own nails.  Patient was last seen here in the office 03/12/2021.  Patient is here for further evaluation and treatment.  Past Medical History:  Diagnosis Date   Aneurysm ([facility])    over left eye   Cataracts, bilateral    Dementia ([facility])    DEPRESSION 12/06/2006   Dr. Clovis Pu   DIVERTICULOSIS, COLON 12/06/2006   GERD 12/06/2006   HYPERCHOLESTEROLEMIA 07/27/2007   HYPERTENSION 07/27/2007   Irritable bowel syndrome 12/06/2006   Osteoporosis    rx refused   Palpitations 05/23/2008   Tubular adenoma 09/09/2009   VITAMIN D DEFICIENCY 05/23/2008    No Known Allergies     OBJECTIVE General Patient is awake, alert, and oriented x 3 and in no acute distress. Derm small ulcer noted to the medial aspect of the first MTP joint measuring approximately 0.3 time 0.3 time 0.1 cm.  Granular wound base.  There is no exposed bone muscle tendon ligament or joint.  No malodor.  It appears very chronic and stable.  Please see above noted photo  nails are tender, long, thickened and dystrophic with subungual debris, consistent with onychomycosis, 1-5 bilateral. No signs of infection noted. Vasc  DP and PT pedal pulses palpable bilaterally. Temperature gradient within normal limits.  Neuro light touch and protective threshold sensation intact bilaterally.  Musculoskeletal Exam No symptomatic pedal deformities noted bilateral. Muscular strength within normal limits.  ASSESSMENT 1.  Pain due to onychomycosis of toenails  bilateral 2.  Ulcer medial aspect of the first MTP left  PLAN OF CARE 1. Patient evaluated today. 2. Instructed to maintain good pedal hygiene and foot care.  3. Mechanical debridement of nails 1-5 bilaterally performed using a nail nipper. Filed with dremel without incident.  4.  Excisional debridement of the hyperkeratotic callus tissue to the overlying ulcer was performed and additional debridement including subcutaneous tissue was also performed using a tissue nipper.  Excisional debridement of the necrotic nonviable tissue down to healthier bleeding viable tissue was performed with postdebridement measurement same as pre- 5.  Recommend triple antibiotic ointment and a light dressing daily to the area  6.  Return to clinic in 3 mos. for routine foot care    Edrick Kins, DPM [facility]  Dr. Edrick Kins, DPM    [address], [place] [address]                Office [phone]  Fax [phone]

## 2022-01-14 NOTE — Telephone Encounter (Signed)
Patient was seen in the clinic on yesterday. No further evaluation is needed at this time.

## 2022-01-20 ENCOUNTER — Ambulatory Visit: Payer: Medicare Other | Admitting: Psychiatry

## 2022-01-20 ENCOUNTER — Emergency Department (HOSPITAL_COMMUNITY)
Admission: EM | Admit: 2022-01-20 | Discharge: 2022-01-20 | Disposition: A | Discharge status: Home / Self Care | Payer: Medicare HMO | Attending: Emergency Medicine | Admitting: Emergency Medicine

## 2022-01-20 ENCOUNTER — Emergency Department (HOSPITAL_COMMUNITY): Payer: Medicare HMO

## 2022-01-20 ENCOUNTER — Other Ambulatory Visit: Payer: Self-pay

## 2022-01-20 ENCOUNTER — Encounter (HOSPITAL_COMMUNITY): Payer: Self-pay

## 2022-01-20 DIAGNOSIS — R42 Dizziness and giddiness: Secondary | ICD-10-CM | POA: Insufficient documentation

## 2022-01-20 DIAGNOSIS — R55 Syncope and collapse: Secondary | ICD-10-CM | POA: Insufficient documentation

## 2022-01-20 DIAGNOSIS — R69 Illness, unspecified: Secondary | ICD-10-CM | POA: Diagnosis not present

## 2022-01-20 DIAGNOSIS — I10 Essential (primary) hypertension: Secondary | ICD-10-CM | POA: Diagnosis not present

## 2022-01-20 DIAGNOSIS — R531 Weakness: Secondary | ICD-10-CM | POA: Diagnosis not present

## 2022-01-20 DIAGNOSIS — F0390 Unspecified dementia without behavioral disturbance: Secondary | ICD-10-CM | POA: Diagnosis not present

## 2022-01-20 DIAGNOSIS — Z743 Need for continuous supervision: Secondary | ICD-10-CM | POA: Diagnosis not present

## 2022-01-20 DIAGNOSIS — I959 Hypotension, unspecified: Secondary | ICD-10-CM | POA: Diagnosis not present

## 2022-01-20 LAB — CBC WITH DIFFERENTIAL/PLATELET
Abs Immature Granulocytes: 0.01 10*3/uL (ref 0.00–0.07)
Basophils Absolute: 0.1 10*3/uL (ref 0.0–0.1)
Basophils Relative: 1 %
Eosinophils Absolute: 0.2 10*3/uL (ref 0.0–0.5)
Eosinophils Relative: 3 %
HCT: 43.3 % (ref 36.0–46.0)
Hemoglobin: 14 g/dL (ref 12.0–15.0)
Immature Granulocytes: 0 %
Lymphocytes Relative: 23 %
Lymphs Abs: 1.3 10*3/uL (ref 0.7–4.0)
MCH: 27.8 pg (ref 26.0–34.0)
MCHC: 32.3 g/dL (ref 30.0–36.0)
MCV: 86.1 fL (ref 80.0–100.0)
Monocytes Absolute: 0.5 10*3/uL (ref 0.1–1.0)
Monocytes Relative: 9 %
Neutro Abs: 3.7 10*3/uL (ref 1.7–7.7)
Neutrophils Relative %: 64 %
Platelets: 311 10*3/uL (ref 150–400)
RBC: 5.03 MIL/uL (ref 3.87–5.11)
RDW: 13.5 % (ref 11.5–15.5)
WBC: 5.7 10*3/uL (ref 4.0–10.5)
nRBC: 0 % (ref 0.0–0.2)

## 2022-01-20 LAB — URINALYSIS, ROUTINE W REFLEX MICROSCOPIC
Bacteria, UA: NONE SEEN
Bilirubin Urine: NEGATIVE
Glucose, UA: NEGATIVE mg/dL
Hgb urine dipstick: NEGATIVE
Ketones, ur: NEGATIVE mg/dL
Nitrite: NEGATIVE
Protein, ur: NEGATIVE mg/dL
Specific Gravity, Urine: 1.02 (ref 1.005–1.030)
pH: 5 (ref 5.0–8.0)

## 2022-01-20 LAB — BASIC METABOLIC PANEL
Anion gap: 7 (ref 5–15)
BUN: 13 mg/dL (ref 8–23)
CO2: 26 mmol/L (ref 22–32)
Calcium: 9.2 mg/dL (ref 8.9–10.3)
Chloride: 100 mmol/L (ref 98–111)
Creatinine, Ser: 0.75 mg/dL (ref 0.44–1.00)
GFR, Estimated: >60 mL/min (ref >60)
Glucose, Bld: 101 mg/dL — ABNORMAL HIGH (ref 70–99)
Potassium: 4.5 mmol/L (ref 3.5–5.1)
Sodium: 133 mmol/L — ABNORMAL LOW (ref 135–145)

## 2022-01-20 LAB — CBG MONITORING, ED: Glucose-Capillary: 90 mg/dL (ref 70–99)

## 2022-01-20 NOTE — ED Provider Notes (Signed)
[facility] DEPT Provider Note   CSN: [id number] Arrival date & time: 01/20/22  1334     History  Chief Complaint  Patient presents with   Loss of Consciousness    Kara Davis is a 87 y.o. female presenting emergency department with near syncope.  The patient was at dinner with her daughter, who is also here providing history, and reportedly the patient began to feel lightheaded and her daughter reports the patient "slumped over".  The patient reports that she was awake during this and conscious but felt lightheaded.  Her daughter said the patient was nonverbal for about 30 to 40 seconds, and afterwards was able to speak.  There is no jerking or clonic or seizure-like activity.  Her daughter reports the patient frequent complaints of lightheadedness, is been eating poorly.  Patient denies any known history of arrhythmias, anemia, blood thinner use.  She denies any chest pain or pressure.  Here in the ED she feels asymptomatic and normal.  HPI     Home Medications Prior to Admission medications   Medication Sig Start Date End Date Taking? Authorizing Provider  ALPRAZolam (XANAX) 0.25 MG tablet TAKE 1 TABLET BY MOUTH 3 TIMES DAILY AS NEEDED FOR ANXIETY. **[facility]** 12/01/21   Cottle, Billey [employer]., MD  Cholecalciferol (VITAMIN D-3) 25 MCG (1000 UT) CAPS Take 1,000 Units by mouth daily.    [provider]  Clotrimazole 1 % OINT Apply 2 g topically daily. 01/12/22   Sherrell Puller, PA-C  [facility] DRY EYE RELIEF) SOLN Place 1 drop into both eyes daily.    [provider]  HYDROcodone-acetaminophen (NORCO/VICODIN) 5-325 MG tablet Take 1 tablet by mouth every 4-6 hours as needed for abdominal pain 03/14/15   Mauri Pole, MD  levothyroxine (SYNTHROID) 75 MCG tablet Take 75 mcg by mouth daily before breakfast. 11/06/18   [provider]  melatonin 5 MG TABS Take 5 mg by mouth at bedtime as needed (sleep).     [provider]  Multiple Vitamin (MULTIVITAMIN WITH MINERALS) TABS tablet Take 1 tablet by mouth daily.    [provider]  mupirocin cream (BACTROBAN) 2 % Apply 1 application. topically 2 (two) times daily. 07/06/21   Malvin Johns, MD  ondansetron (ZOFRAN-ODT) 4 MG disintegrating tablet DISSOLVE 1 TABLET ON TONGUE DAILY AS NEEDED FOR NAUSEA OR VOMITING. 08/03/21   Mauri Pole, MD  QUEtiapine (SEROQUEL) 25 MG tablet Take 25 mg by mouth at bedtime. 12/30/21   [provider]  Sodium Chloride-Xylitol (XLEAR SINUS CARE SPRAY) SOLN Place 1 spray into both nostrils daily.    [provider]  sucralfate (CARAFATE) 1 GM/10ML suspension TAKE 10 MLS BY MOUTH 4 TIMES DAILY - WITH MEALS AND AT BEDTIME. 07/09/21   Mauri Pole, MD      Allergies    Patient has no known allergies.    Review of Systems   Review of Systems  Physical Exam Updated Vital Signs BP (!) 145/51   Pulse 61   Temp 97.9 F (36.6 C) (Oral)   Resp 17   SpO2 99%  Physical Exam Constitutional:      General: She is not in acute distress. HENT:     Head: Normocephalic and atraumatic.  Eyes:     Conjunctiva/sclera: Conjunctivae normal.     Pupils: Pupils are equal, round, and reactive to light.  Cardiovascular:     Rate and Rhythm: Normal rate and regular rhythm.  Pulmonary:  Effort: Pulmonary effort is normal. No respiratory distress.  Abdominal:     General: There is no distension.     Tenderness: There is no abdominal tenderness.  Skin:    General: Skin is warm and dry.  Neurological:     General: No focal deficit present.     Mental Status: She is alert. Mental status is at baseline.  Psychiatric:        Mood and Affect: Mood normal.        Behavior: Behavior normal.     ED Results / Procedures / Treatments   Labs (all labs ordered are listed, but only abnormal results are displayed) Labs Reviewed  BASIC METABOLIC PANEL - Abnormal; Notable for the following  components:      Result Value   Sodium 133 (*)    Glucose, Bld 101 (*)    All other components within normal limits  URINALYSIS, ROUTINE W REFLEX MICROSCOPIC - Abnormal; Notable for the following components:   Leukocytes,Ua TRACE (*)    All other components within normal limits  CBC WITH DIFFERENTIAL/PLATELET  CBG MONITORING, ED  CBG MONITORING, ED    EKG None  Radiology DG Chest 2 View  Result Date: 01/20/2022 CLINICAL DATA:  Weakness. EXAM: CHEST - 2 VIEW COMPARISON:  July 11, 2021. FINDINGS: The heart size and mediastinal contours are within normal limits. Both lungs are clear. Hyperexpansion of the lungs. The visualized skeletal structures are unremarkable. IMPRESSION: No active cardiopulmonary disease. Electronically Signed   By: Marijo Conception M.D.   On: 01/20/2022 14:17    Procedures Procedures    Medications Ordered in ED Medications - No data to display  ED Course/ Medical Decision Making/ A&P                           Medical Decision Making  This patient presents to the ED with concern for near syncope. This involves an extensive number of treatment options, and is a complaint that carries with it a high risk of complications and morbidity.  The differential diagnosis includes arrhythmia versus pneumonia versus infection versus anemia versus vasovagal episode versus other  Co-morbidities that complicate the patient evaluation: History of dementia, patient is level 5 caveat, unreliable history  Additional history obtained from patient's daughter at bedside  External records from outside source obtained and reviewed including echocardiogram in December 2022 with an EF of 60 to 65%, no severe valvular stenosis, and trace pericardial effusion.  Her blood pressure stable in the ED and his presentation is not consistent with cardiac tamponade from large effusion.  Likewise have a low suspicion for PE clinically.  She has no hypoxia or tachycardia.  Do not see an  indication for CT imaging of the chest at this time.  Likewise her presentation is not consistent with a stroke or CVA.  I ordered and personally interpreted labs.  The pertinent results include: No acute anemia.  No leukocytosis.  No significant dehydration.  Glucose within normal limits.  No UTI  I ordered imaging studies including the chest I independently visualized and interpreted imaging which showed no acute abnormality I agree with the radiologist interpretation  The patient was maintained on a cardiac monitor.  I personally viewed and interpreted the cardiac monitored which showed an underlying rhythm of: NSR  Per my interpretation the patient's ECG shows sinus rhythm no acute ischemic findings   After the interventions noted above, I reevaluated the patient and found  that they have: stayed the same  She remained asymptomatic throughout her nearly 6 hours stay in the emergency department  Social Determinants of Health:Daughter reports the patient is a 34 caretaker lives with her at home  Dispostion:  After consideration of the diagnostic results and the patients response to treatment, I feel that the patent would benefit from referral to cardiology for outpatient follow-up, specifically for evaluation for potential arrhythmia, or Holter monitoring.  I did discuss with the patient and her daughter the option of observation stay in the hospital on telemetry, as I cannot exclude the possibility that she experienced an arrhythmia.  They would prefer to go home at this time.  They understand the risk could include recurring arrhythmia, dizziness, lightheadedness, falls, and death.  However I do think that this is a reasonable decision engaged in shared medical decision making, given the patient's dementia and her discomfort with staying in the hospital, and the likelihood this is a chronic ongoing condition.  Return precautions were discussed.         Final Clinical Impression(s) /  ED Diagnoses Final diagnoses:  Near syncope    Rx / DC Orders ED Discharge Orders          Ordered    Ambulatory referral to Cardiology       Comments: Arrhythmia evaluation / holter monitoring for syncope   01/20/22 1944              Wyvonnia Dusky, MD 01/20/22 1953

## 2022-01-20 NOTE — ED Triage Notes (Signed)
Patient BIB [facility] from a restaurant. Had a syncopal episode that lasted for 45 seconds. Patient was in a seated position when she lost consciousness. Felt tired and nauseous. Poor appetite for a few days.

## 2022-01-20 NOTE — ED Provider Triage Note (Signed)
Emergency Medicine Provider Triage Evaluation Note  Kara Davis , a 87 y.o. female  was evaluated in triage.  Pt complains of a syncopal episode.  Patient was in a restaurant with her daughter when she felt tired and slumped over on her daughter's shoulder.  The patient did not fall, did not hit her head, has no complaints of injury at this time.  Patient was reportedly unconscious for approximately 30 to 45 seconds.  EMS noted that she was hypotensive upon their arrival and administered a saline bolus.  Patient is currently normotensive/hypertensive.  Patient endorses generalized weakness over the past few days.  Patient's daughter endorses frequent near falls over the past few weeks.  Patient denies shortness of breath, chest pain, abdominal pain, headache, urinary symptoms.  Patient is not on blood thinners  Review of Systems  Positive: As above Negative: As above  Physical Exam  BP (!) 155/53   Pulse 60   Temp 97.6 F (36.4 C) (Oral)   Resp 16   SpO2 98%  Gen:   Awake, no distress   Resp:  Normal effort  MSK:   Moves extremities without difficulty  Other:    Medical Decision Making  Medically screening exam initiated at 1:51 PM.  Appropriate orders placed.  [name] was informed that the remainder of the evaluation will be completed by another provider, this initial triage assessment does not replace that evaluation, and the importance of remaining in the ED until their evaluation is complete.     Dorothyann Peng, PA-C 01/20/22 1352

## 2022-01-20 NOTE — Discharge Instructions (Signed)
I placed a referral to a heart doctor or cardiologist to help schedule a follow-up appointment in the office.  You will need further evaluation to make sure your heart is not having dangerous rhythms, or arrhythmias.  This can cause people to become lightheaded and lose consciousness, and can even lead to death.  We talked about the options of staying in the hospital overnight on observation, versus following up as an outpatient.  You preferred to go home and follow-up as an outpatient.  If you do not hear from the heart doctors office within 3 business days, please call the number above to make sure you have a follow-up appointment.  You should also follow-up with your primary care doctor if you continue feeling lightheaded.

## 2022-01-20 NOTE — ED Triage Notes (Signed)
Patient attempted urine sample. Unsuccessful. 

## 2022-02-03 ENCOUNTER — Ambulatory Visit: Payer: Medicare HMO | Admitting: Internal Medicine

## 2022-02-14 ENCOUNTER — Emergency Department (HOSPITAL_BASED_OUTPATIENT_CLINIC_OR_DEPARTMENT_OTHER)
Admission: EM | Admit: 2022-02-14 | Discharge: 2022-02-14 | Discharge status: Against Medical Advice | Payer: Medicare HMO | Attending: Emergency Medicine | Admitting: Emergency Medicine

## 2022-02-14 ENCOUNTER — Other Ambulatory Visit: Payer: Self-pay

## 2022-02-14 ENCOUNTER — Encounter (HOSPITAL_BASED_OUTPATIENT_CLINIC_OR_DEPARTMENT_OTHER): Payer: Self-pay

## 2022-02-14 DIAGNOSIS — Z5321 Procedure and treatment not carried out due to patient leaving prior to being seen by health care provider: Secondary | ICD-10-CM | POA: Diagnosis not present

## 2022-02-14 DIAGNOSIS — M21612 Bunion of left foot: Secondary | ICD-10-CM | POA: Insufficient documentation

## 2022-02-14 NOTE — ED Triage Notes (Signed)
Pt arrives via pov from home. Pt has a bunion to her left foot that has developed a sore. [name] not a diabetic. She states the pain has been keeping her from getting a good nights rest. Area is is reddened, but no other obvious signs of infection.

## 2022-02-15 ENCOUNTER — Encounter: Payer: Self-pay | Admitting: Podiatry

## 2022-02-15 ENCOUNTER — Ambulatory Visit (INDEPENDENT_AMBULATORY_CARE_PROVIDER_SITE_OTHER): Payer: Medicare HMO

## 2022-02-15 ENCOUNTER — Ambulatory Visit: Payer: Medicare HMO | Admitting: Podiatry

## 2022-02-15 DIAGNOSIS — M21612 Bunion of left foot: Secondary | ICD-10-CM

## 2022-02-15 DIAGNOSIS — M79672 Pain in left foot: Secondary | ICD-10-CM | POA: Diagnosis not present

## 2022-02-15 MED ORDER — DOXYCYCLINE HYCLATE 100 MG PO TABS: 100.0000 mg | ORAL_TABLET | Freq: Two times a day (BID) | ORAL | 1 refills | Status: DC

## 2022-02-15 NOTE — Progress Notes (Signed)
Subjective:   Patient ID: Kara Davis, female   DOB: 87 y.o.   MRN: [id number]   HPI Patient presents with caregiver stating that she has had a lot of pain in her left foot she has been using a lot of Biofreeze and might have used too much of it   ROS      Objective:  Physical Exam  No change in neurovascular status from previous visit with patient found to have some redness around the first MPJ left medial side and a slight abrasion on the mid arch area left probably caused by overusage of Biofreeze to try to help with pain.  Not a good historian but it seems that its been worse recently     Assessment:  Significant external deformity with inflammatory condition with possibility for low-grade infection with no drainage noted currently     Plan:  Reviewed condition at great length took x-rays and discussed conservative treatment cushioning and I am placing her on an antibiotic consisting of doxycycline twice daily.  May require other treatments educated him on this if any drainage were to occur or any other indications of redness infection they are to reappoint immediately  X-rays indicate osteoporosis but I did not note any significant other pathology no indications of osteomyelitis at the current time.  Does have significant structural bunion left

## 2022-02-18 ENCOUNTER — Telehealth: Payer: Self-pay

## 2022-02-18 NOTE — Telephone Encounter (Signed)
     Patient  visit on 11/12  at [facility]   Have you been able to follow up with your primary care physician? Yes   The patient was or was not able to obtain any needed medicine or equipment. Yes   Are there diet recommendations that you are having difficulty following? Na   Patient expresses understanding of discharge instructions and education provided has no other needs at this time.  Yes      [place], [facility], Care Management  [phone] [address], [place], [place] [address] Phone: [phone] Email: [email or web address]

## 2022-03-04 ENCOUNTER — Other Ambulatory Visit: Payer: Self-pay | Admitting: Podiatry

## 2022-03-04 DIAGNOSIS — M79672 Pain in left foot: Secondary | ICD-10-CM

## 2022-03-04 DIAGNOSIS — M21612 Bunion of left foot: Secondary | ICD-10-CM

## 2022-04-01 DIAGNOSIS — N390 Urinary tract infection, site not specified: Secondary | ICD-10-CM | POA: Diagnosis not present

## 2022-04-01 DIAGNOSIS — R350 Frequency of micturition: Secondary | ICD-10-CM | POA: Diagnosis not present

## 2022-04-01 DIAGNOSIS — Z7689 Persons encountering health services in other specified circumstances: Secondary | ICD-10-CM | POA: Diagnosis not present

## 2022-04-21 ENCOUNTER — Ambulatory Visit: Payer: Medicare HMO | Admitting: Podiatry

## 2022-04-21 VITALS — BP 122/81 | HR 91

## 2022-04-21 DIAGNOSIS — R69 Illness, unspecified: Secondary | ICD-10-CM | POA: Diagnosis not present

## 2022-04-21 DIAGNOSIS — R627 Adult failure to thrive: Secondary | ICD-10-CM | POA: Diagnosis not present

## 2022-04-21 DIAGNOSIS — M79675 Pain in left toe(s): Secondary | ICD-10-CM

## 2022-04-21 DIAGNOSIS — M79674 Pain in right toe(s): Secondary | ICD-10-CM

## 2022-04-21 DIAGNOSIS — I708 Atherosclerosis of other arteries: Secondary | ICD-10-CM | POA: Diagnosis not present

## 2022-04-21 DIAGNOSIS — I129 Hypertensive chronic kidney disease with stage 1 through stage 4 chronic kidney disease, or unspecified chronic kidney disease: Secondary | ICD-10-CM | POA: Diagnosis not present

## 2022-04-21 DIAGNOSIS — G720 Drug-induced myopathy: Secondary | ICD-10-CM | POA: Diagnosis not present

## 2022-04-21 DIAGNOSIS — N182 Chronic kidney disease, stage 2 (mild): Secondary | ICD-10-CM | POA: Diagnosis not present

## 2022-04-21 DIAGNOSIS — G300 Alzheimer's disease with early onset: Secondary | ICD-10-CM | POA: Diagnosis not present

## 2022-04-21 DIAGNOSIS — M21612 Bunion of left foot: Secondary | ICD-10-CM | POA: Diagnosis not present

## 2022-04-21 DIAGNOSIS — B351 Tinea unguium: Secondary | ICD-10-CM

## 2022-04-21 DIAGNOSIS — I671 Cerebral aneurysm, nonruptured: Secondary | ICD-10-CM | POA: Diagnosis not present

## 2022-04-21 DIAGNOSIS — M810 Age-related osteoporosis without current pathological fracture: Secondary | ICD-10-CM | POA: Diagnosis not present

## 2022-04-21 DIAGNOSIS — I351 Nonrheumatic aortic (valve) insufficiency: Secondary | ICD-10-CM | POA: Diagnosis not present

## 2022-04-21 NOTE — Progress Notes (Signed)
   Chief Complaint  Patient presents with   Foot Ulcer    Left foot ulcer, patient still having some pain,  nail trim TX: Doxycyline,     SUBJECTIVE Patient presents to office today complaining of elongated, thickened nails that cause pain while ambulating in shoes.  Patient is unable to trim their own nails.  Patient also developed symptomatic callus to the medial aspect of the left foot bunion site.  Patient is here for further evaluation and treatment.  Past Medical History:  Diagnosis Date   Aneurysm ([facility])    over left eye   Cataracts, bilateral    Dementia ([facility])    DEPRESSION 12/06/2006   Dr. Clovis Pu   DIVERTICULOSIS, COLON 12/06/2006   GERD 12/06/2006   HYPERCHOLESTEROLEMIA 07/27/2007   HYPERTENSION 07/27/2007   Irritable bowel syndrome 12/06/2006   Osteoporosis    rx refused   Palpitations 05/23/2008   Tubular adenoma 09/09/2009   VITAMIN D DEFICIENCY 05/23/2008    No Known Allergies   OBJECTIVE General Patient is awake, alert, and oriented x 3 and in no acute distress. Derm Skin is dry and supple bilateral. Negative open lesions or macerations. Remaining integument unremarkable. Nails are tender, long, thickened and dystrophic with subungual debris, consistent with onychomycosis, 1-5 bilateral. No signs of infection noted.  Hyperkeratotic preulcerative callus noted left foot Vasc  DP and PT pedal pulses palpable bilaterally. Temperature gradient within normal limits.  Neuro Epicritic and protective threshold sensation grossly intact bilaterally.  Musculoskeletal Exam hallux valgus noted bilateral  ASSESSMENT 1.  Pain due to onychomycosis of toenails both 2.  Preulcerative callus lesion medial aspect of the left foot bunion 3.  Hallux valgus left  PLAN OF CARE 1. Patient evaluated today.  2. Instructed to maintain good pedal hygiene and foot care.  3. Mechanical debridement of nails 1-5 bilaterally performed using a nail nipper. Filed with dremel without  incident.  4.  Light excisional debridement of the hyperkeratotic preulcerative callus lesion was performed using a 312 scalpel without incident or bleeding.  5.  Silicone bunion pads were dispensed and provided for the patient to alleviate pressure from the bunion.  Continue conservative treatment 6.  Return to clinic in 3 mos.    Edrick Kins, DPM [facility]  Dr. Edrick Kins, DPM    [address], [place] [address]                Office [phone]  Fax [phone]

## 2022-04-23 ENCOUNTER — Emergency Department (HOSPITAL_COMMUNITY): Payer: Medicare HMO

## 2022-04-23 ENCOUNTER — Inpatient Hospital Stay (HOSPITAL_COMMUNITY)
Admission: EM | Admit: 2022-04-23 | Discharge: 2022-04-30 | DRG: 522 | Disposition: A | Discharge status: Skilled Nursing Facility | Payer: Medicare HMO | Attending: Family Medicine | Admitting: Family Medicine

## 2022-04-23 ENCOUNTER — Encounter (HOSPITAL_COMMUNITY): Payer: Self-pay

## 2022-04-23 ENCOUNTER — Other Ambulatory Visit: Payer: Self-pay

## 2022-04-23 DIAGNOSIS — W010XXA Fall on same level from slipping, tripping and stumbling without subsequent striking against object, initial encounter: Secondary | ICD-10-CM | POA: Diagnosis present

## 2022-04-23 DIAGNOSIS — D72829 Elevated white blood cell count, unspecified: Secondary | ICD-10-CM | POA: Diagnosis present

## 2022-04-23 DIAGNOSIS — R413 Other amnesia: Secondary | ICD-10-CM | POA: Diagnosis present

## 2022-04-23 DIAGNOSIS — R69 Illness, unspecified: Secondary | ICD-10-CM | POA: Diagnosis not present

## 2022-04-23 DIAGNOSIS — Y92019 Unspecified place in single-family (private) house as the place of occurrence of the external cause: Secondary | ICD-10-CM

## 2022-04-23 DIAGNOSIS — W19XXXA Unspecified fall, initial encounter: Principal | ICD-10-CM

## 2022-04-23 DIAGNOSIS — Z471 Aftercare following joint replacement surgery: Secondary | ICD-10-CM | POA: Diagnosis not present

## 2022-04-23 DIAGNOSIS — Z043 Encounter for examination and observation following other accident: Secondary | ICD-10-CM | POA: Diagnosis not present

## 2022-04-23 DIAGNOSIS — M1611 Unilateral primary osteoarthritis, right hip: Secondary | ICD-10-CM | POA: Diagnosis not present

## 2022-04-23 DIAGNOSIS — Z833 Family history of diabetes mellitus: Secondary | ICD-10-CM | POA: Diagnosis not present

## 2022-04-23 DIAGNOSIS — F03A4 Unspecified dementia, mild, with anxiety: Secondary | ICD-10-CM | POA: Diagnosis present

## 2022-04-23 DIAGNOSIS — R400 Somnolence: Secondary | ICD-10-CM | POA: Diagnosis not present

## 2022-04-23 DIAGNOSIS — D62 Acute posthemorrhagic anemia: Secondary | ICD-10-CM

## 2022-04-23 DIAGNOSIS — Z87891 Personal history of nicotine dependence: Secondary | ICD-10-CM

## 2022-04-23 DIAGNOSIS — E039 Hypothyroidism, unspecified: Secondary | ICD-10-CM | POA: Diagnosis not present

## 2022-04-23 DIAGNOSIS — K219 Gastro-esophageal reflux disease without esophagitis: Secondary | ICD-10-CM | POA: Diagnosis present

## 2022-04-23 DIAGNOSIS — J439 Emphysema, unspecified: Secondary | ICD-10-CM | POA: Diagnosis not present

## 2022-04-23 DIAGNOSIS — F32A Depression, unspecified: Secondary | ICD-10-CM | POA: Diagnosis not present

## 2022-04-23 DIAGNOSIS — M25551 Pain in right hip: Secondary | ICD-10-CM | POA: Diagnosis not present

## 2022-04-23 DIAGNOSIS — R6510 Systemic inflammatory response syndrome (SIRS) of non-infectious origin without acute organ dysfunction: Secondary | ICD-10-CM | POA: Diagnosis not present

## 2022-04-23 DIAGNOSIS — I10 Essential (primary) hypertension: Secondary | ICD-10-CM | POA: Diagnosis present

## 2022-04-23 DIAGNOSIS — Z66 Do not resuscitate: Secondary | ICD-10-CM | POA: Diagnosis not present

## 2022-04-23 DIAGNOSIS — S72001A Fracture of unspecified part of neck of right femur, initial encounter for closed fracture: Secondary | ICD-10-CM | POA: Diagnosis not present

## 2022-04-23 DIAGNOSIS — Z743 Need for continuous supervision: Secondary | ICD-10-CM | POA: Diagnosis not present

## 2022-04-23 DIAGNOSIS — Z96641 Presence of right artificial hip joint: Secondary | ICD-10-CM | POA: Diagnosis not present

## 2022-04-23 DIAGNOSIS — D638 Anemia in other chronic diseases classified elsewhere: Secondary | ICD-10-CM | POA: Diagnosis not present

## 2022-04-23 DIAGNOSIS — Z8042 Family history of malignant neoplasm of prostate: Secondary | ICD-10-CM

## 2022-04-23 DIAGNOSIS — M80051A Age-related osteoporosis with current pathological fracture, right femur, initial encounter for fracture: Principal | ICD-10-CM | POA: Diagnosis present

## 2022-04-23 DIAGNOSIS — F03A3 Unspecified dementia, mild, with mood disturbance: Secondary | ICD-10-CM | POA: Diagnosis present

## 2022-04-23 DIAGNOSIS — E7800 Pure hypercholesterolemia, unspecified: Secondary | ICD-10-CM | POA: Diagnosis not present

## 2022-04-23 DIAGNOSIS — Z79890 Hormone replacement therapy (postmenopausal): Secondary | ICD-10-CM | POA: Diagnosis not present

## 2022-04-23 DIAGNOSIS — E222 Syndrome of inappropriate secretion of antidiuretic hormone: Secondary | ICD-10-CM | POA: Diagnosis not present

## 2022-04-23 DIAGNOSIS — M11262 Other chondrocalcinosis, left knee: Secondary | ICD-10-CM | POA: Diagnosis not present

## 2022-04-23 DIAGNOSIS — D649 Anemia, unspecified: Secondary | ICD-10-CM | POA: Diagnosis not present

## 2022-04-23 DIAGNOSIS — E871 Hypo-osmolality and hyponatremia: Secondary | ICD-10-CM | POA: Diagnosis not present

## 2022-04-23 DIAGNOSIS — Z8249 Family history of ischemic heart disease and other diseases of the circulatory system: Secondary | ICD-10-CM

## 2022-04-23 DIAGNOSIS — Z79899 Other long term (current) drug therapy: Secondary | ICD-10-CM

## 2022-04-23 DIAGNOSIS — R9431 Abnormal electrocardiogram [ECG] [EKG]: Secondary | ICD-10-CM | POA: Diagnosis not present

## 2022-04-23 DIAGNOSIS — S72041A Displaced fracture of base of neck of right femur, initial encounter for closed fracture: Secondary | ICD-10-CM | POA: Diagnosis not present

## 2022-04-23 DIAGNOSIS — M25572 Pain in left ankle and joints of left foot: Secondary | ICD-10-CM | POA: Diagnosis not present

## 2022-04-23 DIAGNOSIS — F418 Other specified anxiety disorders: Secondary | ICD-10-CM | POA: Diagnosis present

## 2022-04-23 LAB — CBC WITH DIFFERENTIAL/PLATELET
Abs Immature Granulocytes: 0.11 10*3/uL — ABNORMAL HIGH (ref 0.00–0.07)
Basophils Absolute: 0 10*3/uL (ref 0.0–0.1)
Basophils Relative: 0 %
Eosinophils Absolute: 0 10*3/uL (ref 0.0–0.5)
Eosinophils Relative: 0 %
HCT: 41.2 % (ref 36.0–46.0)
Hemoglobin: 13.7 g/dL (ref 12.0–15.0)
Immature Granulocytes: 1 %
Lymphocytes Relative: 2 %
Lymphs Abs: 0.3 10*3/uL — ABNORMAL LOW (ref 0.7–4.0)
MCH: 27.6 pg (ref 26.0–34.0)
MCHC: 33.3 g/dL (ref 30.0–36.0)
MCV: 83.1 fL (ref 80.0–100.0)
Monocytes Absolute: 1.6 10*3/uL — ABNORMAL HIGH (ref 0.1–1.0)
Monocytes Relative: 9 %
Neutro Abs: 15.1 10*3/uL — ABNORMAL HIGH (ref 1.7–7.7)
Neutrophils Relative %: 88 %
Platelets: 340 10*3/uL (ref 150–400)
RBC: 4.96 MIL/uL (ref 3.87–5.11)
RDW: 13.3 % (ref 11.5–15.5)
WBC: 17.1 10*3/uL — ABNORMAL HIGH (ref 4.0–10.5)
nRBC: 0 % (ref 0.0–0.2)

## 2022-04-23 LAB — TYPE AND SCREEN
ABO/RH(D): O POS
Antibody Screen: NEGATIVE

## 2022-04-23 LAB — URINALYSIS, ROUTINE W REFLEX MICROSCOPIC
Bacteria, UA: NONE SEEN
Bilirubin Urine: NEGATIVE
Glucose, UA: NEGATIVE mg/dL
Hgb urine dipstick: NEGATIVE
Ketones, ur: NEGATIVE mg/dL
Nitrite: NEGATIVE
Protein, ur: 30 mg/dL — AB
Specific Gravity, Urine: 1.013 (ref 1.005–1.030)
pH: 7 (ref 5.0–8.0)

## 2022-04-23 LAB — BASIC METABOLIC PANEL
Anion gap: 9 (ref 5–15)
BUN: 14 mg/dL (ref 8–23)
CO2: 25 mmol/L (ref 22–32)
Calcium: 8.8 mg/dL — ABNORMAL LOW (ref 8.9–10.3)
Chloride: 96 mmol/L — ABNORMAL LOW (ref 98–111)
Creatinine, Ser: 0.85 mg/dL (ref 0.44–1.00)
GFR, Estimated: >60 mL/min (ref >60)
Glucose, Bld: 164 mg/dL — ABNORMAL HIGH (ref 70–99)
Potassium: 3.6 mmol/L (ref 3.5–5.1)
Sodium: 130 mmol/L — ABNORMAL LOW (ref 135–145)

## 2022-04-23 LAB — PROTIME-INR
INR: 1 (ref 0.8–1.2)
Prothrombin Time: 13 seconds (ref 11.4–15.2)

## 2022-04-23 LAB — SODIUM, URINE, RANDOM: Sodium, Ur: 73 mmol/L

## 2022-04-23 LAB — BRAIN NATRIURETIC PEPTIDE: B Natriuretic Peptide: 91.3 pg/mL (ref 0.0–100.0)

## 2022-04-23 LAB — CK: Total CK: 100 U/L (ref 38–234)

## 2022-04-23 MED ORDER — POLYETHYLENE GLYCOL 3350 17 G PO PACK: 17.0000 g | PACK | Freq: Every day | ORAL | Status: DC | PRN

## 2022-04-23 MED ORDER — ONDANSETRON HCL 4 MG/2ML IJ SOLN
4.0000 mg | Freq: Once | INTRAMUSCULAR | Status: AC
  Administered 2022-04-23: 4 mg via INTRAVENOUS
  Filled 2022-04-23: qty 2

## 2022-04-23 MED ORDER — ALPRAZOLAM 0.25 MG PO TABS
0.2500 mg | ORAL_TABLET | Freq: Two times a day (BID) | ORAL | Status: DC | PRN
  Administered 2022-04-23 – 2022-04-27 (×7): 0.25 mg via ORAL
  Filled 2022-04-23 (×8): qty 1

## 2022-04-23 MED ORDER — METHOCARBAMOL 500 MG PO TABS
500.0000 mg | ORAL_TABLET | Freq: Four times a day (QID) | ORAL | Status: DC | PRN
  Administered 2022-04-23 – 2022-04-24 (×2): 500 mg via ORAL
  Filled 2022-04-23 (×2): qty 1

## 2022-04-23 MED ORDER — METHOCARBAMOL 1000 MG/10ML IJ SOLN: 500.0000 mg | Freq: Four times a day (QID) | INTRAVENOUS | Status: DC | PRN

## 2022-04-23 MED ORDER — MORPHINE SULFATE (PF) 2 MG/ML IV SOLN
2.0000 mg | Freq: Once | INTRAVENOUS | Status: AC
  Administered 2022-04-23: 2 mg via INTRAVENOUS
  Filled 2022-04-23: qty 1

## 2022-04-23 MED ORDER — MORPHINE SULFATE (PF) 2 MG/ML IV SOLN
0.5000 mg | INTRAVENOUS | Status: DC | PRN
  Administered 2022-04-24 – 2022-04-25 (×3): 0.5 mg via INTRAVENOUS
  Filled 2022-04-23 (×3): qty 1

## 2022-04-23 MED ORDER — LEVOTHYROXINE SODIUM 75 MCG PO TABS
75.0000 ug | ORAL_TABLET | Freq: Every day | ORAL | Status: DC
  Administered 2022-04-24 – 2022-04-30 (×6): 75 ug via ORAL
  Filled 2022-04-23 (×6): qty 1

## 2022-04-23 MED ORDER — HYDROCODONE-ACETAMINOPHEN 5-325 MG PO TABS
1.0000 | ORAL_TABLET | Freq: Four times a day (QID) | ORAL | Status: DC | PRN
  Administered 2022-04-23 – 2022-04-24 (×3): 2 via ORAL
  Filled 2022-04-23 (×3): qty 2

## 2022-04-23 NOTE — Assessment & Plan Note (Signed)
On no medication  Blood pressure to goal for age Continue to monitor

## 2022-04-23 NOTE — ED Provider Notes (Signed)
[facility] Provider Note   CSN: [id number] Arrival date & time: 04/23/22  1645     History  Chief Complaint  Patient presents with   Lytle Michaels    AMBERLYNN TEMPESTA is a 88 y.o. female with a past medical history significant for dementia and hypothyroidism who presents to the ED after a unwitnessed fall.  Daughter at bedside provided some history.  Daughter notes that patient was found on the ground by patient's caregiver. Daughter believes patient was possibly on the ground for a few hours. Patient notes the ground was slippery which caused her to fall.  Denies head injury or loss of consciousness.  Not currently on any blood thinners.  Patient admits to lower extremity pain. It Is difficult for patient to pinpoint exactly where she is feeling pain.  EMS notes patient was unable to bear weight on the right lower extremity.  Patient states she fell directly on her sacral region.  History of dementia so difficult to obtain HPI.  Daughter at bedside notes that patient typically walks around cautiously.  Typically ambulates with a walker however patient notes she was not using a walker at the time of the fall.  History obtained from patient and past medical records. No interpreter used during encounter.       Home Medications Prior to Admission medications   Medication Sig Start Date End Date Taking? Authorizing Provider  ALPRAZolam (XANAX) 0.25 MG tablet TAKE 1 TABLET BY MOUTH 3 TIMES DAILY AS NEEDED FOR ANXIETY. **[facility]** 12/01/21   Cottle, Billey Co., MD  Cholecalciferol (VITAMIN D-3) 25 MCG (1000 UT) CAPS Take 1,000 Units by mouth daily.    [provider]  Clotrimazole 1 % OINT Apply 2 g topically daily. 01/12/22   Sherrell Puller, PA-C  doxycycline (VIBRA-TABS) 100 MG tablet Take 1 tablet (100 mg total) by mouth 2 (two) times daily. 02/15/22   Wallene Huh, DPM  Homeopathic Products [facility] DRY EYE RELIEF) SOLN Place 1 drop into  both eyes daily.    [provider]  HYDROcodone-acetaminophen (NORCO/VICODIN) 5-325 MG tablet Take 1 tablet by mouth every 4-6 hours as needed for abdominal pain 03/14/15   Mauri Pole, MD  levothyroxine (SYNTHROID) 75 MCG tablet Take 75 mcg by mouth daily before breakfast. 11/06/18   [provider]  melatonin 5 MG TABS Take 5 mg by mouth at bedtime as needed (sleep).    [provider]  Multiple Vitamin (MULTIVITAMIN WITH MINERALS) TABS tablet Take 1 tablet by mouth daily.    [provider]  mupirocin cream (BACTROBAN) 2 % Apply 1 application. topically 2 (two) times daily. 07/06/21   Malvin Johns, MD  ondansetron (ZOFRAN-ODT) 4 MG disintegrating tablet DISSOLVE 1 TABLET ON TONGUE DAILY AS NEEDED FOR NAUSEA OR VOMITING. 08/03/21   Mauri Pole, MD  QUEtiapine (SEROQUEL) 25 MG tablet Take 25 mg by mouth at bedtime. 12/30/21   [provider]  Sodium Chloride-Xylitol (XLEAR SINUS CARE SPRAY) SOLN Place 1 spray into both nostrils daily.    [provider]  sucralfate (CARAFATE) 1 GM/10ML suspension TAKE 10 MLS BY MOUTH 4 TIMES DAILY - WITH MEALS AND AT BEDTIME. 07/09/21   Mauri Pole, MD      Allergies    Patient has no known allergies.    Review of Systems   Review of Systems  Unable to perform ROS: Dementia  Musculoskeletal:  Positive for arthralgias and gait problem.  Physical Exam Updated Vital Signs BP 137/68   Pulse 81   Temp 98.1 F (36.7 C) (Oral)   Resp 17   SpO2 95%  Physical Exam Vitals and nursing note reviewed.  Constitutional:      General: She is not in acute distress.    Appearance: She is not ill-appearing.  HENT:     Head: Normocephalic.  Eyes:     Pupils: Pupils are equal, round, and reactive to light.  Cardiovascular:     Rate and Rhythm: Normal rate and regular rhythm.     Pulses: Normal pulses.     Heart sounds: Normal heart sounds. No murmur heard.    No friction rub. No gallop.   Pulmonary:     Effort: Pulmonary effort is normal.     Breath sounds: Normal breath sounds.  Abdominal:     General: Abdomen is flat. There is no distension.     Palpations: Abdomen is soft.     Tenderness: There is no abdominal tenderness. There is no guarding or rebound.  Musculoskeletal:        General: Normal range of motion.     Cervical back: Neck supple.     Comments: RLE internally rotated.  Some bony tenderness to right hip.  Slight tenderness over right knee.  Patient has difficulty raising both legs from bed.  Skin:    General: Skin is warm and dry.  Neurological:     General: No focal deficit present.     Mental Status: She is alert.  Psychiatric:        Mood and Affect: Mood normal.        Behavior: Behavior normal.     ED Results / Procedures / Treatments   Labs (all labs ordered are listed, but only abnormal results are displayed) Labs Reviewed  CBC WITH DIFFERENTIAL/PLATELET - Abnormal; Notable for the following components:      Result Value   WBC 17.1 (*)    Neutro Abs 15.1 (*)    Lymphs Abs 0.3 (*)    Monocytes Absolute 1.6 (*)    Abs Immature Granulocytes 0.11 (*)    All other components within normal limits  BASIC METABOLIC PANEL - Abnormal; Notable for the following components:   Sodium 130 (*)    Chloride 96 (*)    Glucose, Bld 164 (*)    Calcium 8.8 (*)    All other components within normal limits  CK  PROTIME-INR  BRAIN NATRIURETIC PEPTIDE  URINALYSIS, ROUTINE W REFLEX MICROSCOPIC  TYPE AND SCREEN  ABO/RH    EKG None  Radiology DG Chest Port 1 View  Result Date: 04/23/2022 CLINICAL DATA:  Un witnessed fall. EXAM: PORTABLE CHEST 1 VIEW COMPARISON:  None Available. FINDINGS: Lungs are hyperexpanded. The cardio pericardial silhouette is enlarged. There is pulmonary vascular congestion without overt pulmonary edema. Similar scarring noted at the right lung base. The visualized bony structures of the thorax are unremarkable. IMPRESSION:  Emphysema with vascular congestion. No acute cardiopulmonary findings. Electronically Signed   By: Misty Stanley M.D.   On: 04/23/2022 18:44   DG Hip Unilat W or Wo Pelvis 2-3 Views Right  Result Date: 04/23/2022 CLINICAL DATA:  Pain after fall EXAM: DG HIP (WITH OR WITHOUT PELVIS) 2V RIGHT COMPARISON:  None Available. FINDINGS: Displaced subcapital right femoral neck fracture. Preserved joint spaces. Osteopenia. Mild sclerosis along the pubic symphysis. IMPRESSION: Displaced impacted subcapital femoral neck fracture. Electronically Signed   By: Jill Side M.D.   On:  04/23/2022 18:38   DG Knee Complete 4 Views Left  Result Date: 04/23/2022 CLINICAL DATA:  Pain after fall EXAM: LEFT KNEE - COMPLETE 4 VIEW COMPARISON:  None Available. FINDINGS: Chondrocalcinosis identified greatest of the lateral compartment. No fracture or dislocation. Osteopenia. No joint effusion. Preserved joint spaces. IMPRESSION: Chondrocalcinosis.  Osteopenia Electronically Signed   By: Jill Side M.D.   On: 04/23/2022 18:37   DG Knee Complete 4 Views Right  Result Date: 04/23/2022 CLINICAL DATA:  Unwitnessed fall. EXAM: RIGHT KNEE - COMPLETE 4+ VIEW COMPARISON:  None Available. FINDINGS: Exam is limited by patient positioning. Within this limitation, no fracture or dislocation is evident. No evidence for joint effusion. Degenerative changes are seen in all 3 compartments. No worrisome lytic or sclerotic osseous abnormality. IMPRESSION: 1. No acute bony findings.  No joint effusion. 2. Degenerative changes in all 3 compartments. Electronically Signed   By: Misty Stanley M.D.   On: 04/23/2022 18:37   DG Pelvis 1-2 Views  Result Date: 04/23/2022 CLINICAL DATA:  Pain after fall EXAM: PELVIS - 1-2 VIEW COMPARISON:  X-ray 2007.  CT 07/06/2021 of the abdomen and pelvis FINDINGS: Subcapital displaced right femoral neck fracture identified. No additional fracture or dislocation. Osteopenia. IMPRESSION: Subcapital displaced right  femoral neck fracture . Electronically Signed   By: Jill Side M.D.   On: 04/23/2022 18:36    Procedures Procedures    Medications Ordered in ED Medications  ondansetron (ZOFRAN) injection 4 mg (has no administration in time range)  morphine (PF) 2 MG/ML injection 2 mg (2 mg Intravenous Given 04/23/22 1951)  ondansetron (ZOFRAN) injection 4 mg (4 mg Intravenous Given 04/23/22 1950)  morphine (PF) 2 MG/ML injection 2 mg (2 mg Intravenous Given 04/23/22 2032)    ED Course/ Medical Decision Making/ A&P Clinical Course as of 04/23/22 2129  Fri Apr 23, 2022  2008 Called lab to inquire about BMP results. They note labs are delayed. [CA]    Clinical Course User Index [CA] Suzy Bouchard, PA-C                             Medical Decision Making Amount and/or Complexity of Data Reviewed Independent Historian: caregiver    Details: Daughter at bedside provided history Labs: ordered. Decision-making details documented in ED Course. Radiology: ordered and independent interpretation performed. Decision-making details documented in ED Course. ECG/medicine tests: ordered and independent interpretation performed. Decision-making details documented in ED Course.  Risk Prescription drug management. Decision regarding hospitalization.   This patient presents to the ED for concern of fall, this involves an extensive number of treatment options, and is a complaint that carries with it a high risk of complications and morbidity.  The differential diagnosis includes hip fracture, UTI, viral infection, knee fracture, etc  88 year old female presents to the ED after an unwitnessed fall.  Patient notes it was a mechanical fall due to slippery floor?  History of dementia.  Daughter at bedside.  Not currently blood thinners.  EMS noted patient was unable to bear weight from right lower extremity.  Typically ambulates with a walker.  Daughter at bedside notes patient typically ambulates with caution.   Patient denies head injury or loss of consciousness.  Upon arrival, stable vitals.  Patient in no acute distress.  Patient has difficulty raising both lower extremities from bed.  Some bony tenderness to right hip.  X-rays ordered to rule out bony fractures.  Offered CT head and cervical  spine however, daughter notes that patient typically ambulates cautiously and does not feel CT scans are needed at this time.  Routine labs ordered to rule out other causes of falls.   X-rays personally reviewed and interpreted which demonstrates a right femoral neck fracture.  All other x-rays negative for any acute abnormalities. CXR does show emphysema with vascular congestion.  BNP added to rule out CHF. CBC significant for leukocytosis at 17.1, likely reactive however, awaiting UA results.  PT/INR within normal limits.  Discussed results with daughter at bedside. Daughter is unsure whether or not she would prefer patient to go through with surgery. She would like to discuss with Dr. Alvan Dame in the morning.   Reassessed patient numerous times during her ED stay.  Patient admits to continued pain.  Morphine given.  Also endorsing some nausea.  Zofran given.  7:23 PM Discussed with Dr. Alvan Dame with orthopedics who recommends NPO after midnight.   9:27 PM Discussed with Dr. Rogers Blocker with [facility] who agrees to admit patient for further treatment.       Final Clinical Impression(s) / ED Diagnoses Final diagnoses:  Fall, initial encounter  Closed fracture of neck of right femur, initial encounter [facility])    Rx / DC Orders ED Discharge Orders     None         Karie Kirks 04/23/22 2133    Elgie Congo, MD 04/25/22 1320

## 2022-04-23 NOTE — Assessment & Plan Note (Signed)
TSH pending Continue home synthroid

## 2022-04-23 NOTE — Assessment & Plan Note (Addendum)
Has not had her xanax, but this helps a lot Start back xanax .'25mg'$  Bid prn -Holding Zoloft as it can be contributory to hyponatremia

## 2022-04-23 NOTE — Assessment & Plan Note (Addendum)
Initially met SIRS criteria, but no evidence of infection; however, UA pending Vitals normalized Leukocytosis likely reactive and has been resolved.

## 2022-04-23 NOTE — ED Triage Notes (Signed)
Pt arrives by [facility] from home for an unwitnessed fall at home today. Pt denies pain anywhere, but had a hard time bearing weight on her right leg after the fall.   Pt does not take blood thinners.

## 2022-04-23 NOTE — Assessment & Plan Note (Signed)
Delirium precautions 

## 2022-04-23 NOTE — Assessment & Plan Note (Addendum)
88 year old presenting with unwitnessed fall and found to have displaced right impacted subcapital femoral neck fracture. Orthopedic surgery was consulted and she will be going to the OR tomorrow morning. -Continue with pain management

## 2022-04-23 NOTE — H&P (Signed)
History and Physical    Patient: Kara Davis NWG:[id number] DOB: 03/07/1935 DOA: 04/23/2022 DOS: the patient was seen and examined on 04/24/2022 PCP: Ginger Organ., MD  Patient coming from: Home - lives alone. Ambulates independently    Chief Complaint: fall   HPI: LACREASHA HINDS is a 88 y.o. female with medical history significant of HTN, HLD, GERD, hypothyroidism, anxiety with depression, dementia who presented to ED after a fall. Her daughter left at 12pm and her caregiver got there at 3pm. Sometime between then she states she was walking in the hall and hit a slick spot and slipped and fell. She was in the dining room on the ground when her caregiver got there. She got her off the floor and into a chair.  She then called EMS. She can't recall pain, but states it was uncomfortable.   Her daughter states she did take her first dose of zoloft this AM around 10:30am for anxiety. She has taken in the past with no issues.    Denies any fever/chills, vision changes/headaches, chest pain or palpitations, shortness of breath or cough, abdominal pain, N/V/D, dysuria or leg swelling.    She does not smoke or drink alcohol.   ER Course:  vitals: afebrile, bp: 170/72, HR: 105, RR: 20, oxygen: 95% RA Pertinent labs: wbc: 17.1, sodium: 130,  CXR: emphysema with vascular congestion. No acute finding.  Hip xray: displaced  right impacted subcapital femoral neck fracture Knee xray: chondrocalcinosis In ED: ortho consulted. Given morphine and [facility] asked to admit.   Review of Systems: unable to review all systems due to the inability of the patient to answer questions. Past Medical History:  Diagnosis Date   Aneurysm ([place])    over left eye   Cataracts, bilateral    Dementia ([place])    DEPRESSION 12/06/2006   Dr. Clovis Pu   DIVERTICULOSIS, COLON 12/06/2006   GERD 12/06/2006   HYPERCHOLESTEROLEMIA 07/27/2007   HYPERTENSION 07/27/2007   Irritable bowel syndrome 12/06/2006   Osteoporosis     rx refused   Palpitations 05/23/2008   Tubular adenoma 09/09/2009   VITAMIN D DEFICIENCY 05/23/2008   Past Surgical History:  Procedure Laterality Date   APPENDECTOMY     CATARACT EXTRACTION     EXPLORATORY LAPAROTOMY  1993   secondary to abd. pain   thumb surgery     for fracture   Social History:  reports that she has quit smoking. Her smoking use included cigarettes. She has never used smokeless tobacco. She reports that she does not currently use alcohol. She reports that she does not use drugs.  No Known Allergies  Family History  Problem Relation Age of Onset   Heart disease Mother    Diabetes Brother    Prostate cancer Brother    Colon cancer Neg Hx     Prior to Admission medications   Medication Sig Start Date End Date Taking? Authorizing Provider  ALPRAZolam (XANAX) 0.25 MG tablet TAKE 1 TABLET BY MOUTH 3 TIMES DAILY AS NEEDED FOR ANXIETY. **[facility]** 12/01/21   Cottle, Billey Co., MD  Cholecalciferol (VITAMIN D-3) 25 MCG (1000 UT) CAPS Take 1,000 Units by mouth daily.    [provider]  Clotrimazole 1 % OINT Apply 2 g topically daily. 01/12/22   Sherrell Puller, PA-C  doxycycline (VIBRA-TABS) 100 MG tablet Take 1 tablet (100 mg total) by mouth 2 (two) times daily. 02/15/22   Wallene Huh, DPM  Homeopathic Products [facility] DRY EYE RELIEF) SOLN Place  1 drop into both eyes daily.    [provider]  HYDROcodone-acetaminophen (NORCO/VICODIN) 5-325 MG tablet Take 1 tablet by mouth every 4-6 hours as needed for abdominal pain 03/14/15   Mauri Pole, MD  levothyroxine (SYNTHROID) 75 MCG tablet Take 75 mcg by mouth daily before breakfast. 11/06/18   [provider]  melatonin 5 MG TABS Take 5 mg by mouth at bedtime as needed (sleep).    [provider]  Multiple Vitamin (MULTIVITAMIN WITH MINERALS) TABS tablet Take 1 tablet by mouth daily.    [provider]  mupirocin cream (BACTROBAN) 2 % Apply 1 application. topically  2 (two) times daily. 07/06/21   Malvin Johns, MD  ondansetron (ZOFRAN-ODT) 4 MG disintegrating tablet DISSOLVE 1 TABLET ON TONGUE DAILY AS NEEDED FOR NAUSEA OR VOMITING. 08/03/21   Mauri Pole, MD  QUEtiapine (SEROQUEL) 25 MG tablet Take 25 mg by mouth at bedtime. 12/30/21   [provider]  Sodium Chloride-Xylitol (XLEAR SINUS CARE SPRAY) SOLN Place 1 spray into both nostrils daily.    [provider]  sucralfate (CARAFATE) 1 GM/10ML suspension TAKE 10 MLS BY MOUTH 4 TIMES DAILY - WITH MEALS AND AT BEDTIME. 07/09/21   Mauri Pole, MD    Physical Exam: Vitals:   04/23/22 2100 04/23/22 2200 04/23/22 2300 04/24/22 0000  BP: 137/68 (!) 151/68 123/71 126/63  Pulse: 81 82 80 81  Resp: 17 16 (!) 22 19  Temp:      TempSrc:      SpO2: 95% 98% 95% 96%   General:  Appears calm and comfortable and is in NAD Eyes:  PERRL, EOMI, normal lids, iris ENT:  grossly normal hearing, lips & tongue, dry  mucous membranes, poor dentition.  Neck:  no LAD, masses or thyromegaly; no carotid bruits Cardiovascular:  RRR, no m/r/g. No LE edema.  Respiratory:   CTA bilaterally with no wheezes/rales/rhonchi.  Normal respiratory effort. Abdomen:  soft, NT, ND, NABS Back:   normal alignment, no CVAT Skin:  no rash or induration seen on limited exam Musculoskeletal: RLE: shortened and abducted. No brusiing or edema in ankle or knee joint. TTP along superior lateral hip/greater trochanteric process. Sensation intact. Pedal pulses intact.  Lower extremity:  No LE edema.  Limited foot exam with no ulcerations.  2+ distal pulses. Psychiatric:  grossly normal mood and affect, speech fluent and appropriate, AOx1 Neurologic:  CN 2-12 grossly intact, moves all extremities in coordinated fashion, sensation intact   Radiological Exams on Admission: Independently reviewed - see discussion in A/P where applicable  DG Chest Port 1 View  Result Date: 04/23/2022 CLINICAL DATA:  Un witnessed fall.  EXAM: PORTABLE CHEST 1 VIEW COMPARISON:  None Available. FINDINGS: Lungs are hyperexpanded. The cardio pericardial silhouette is enlarged. There is pulmonary vascular congestion without overt pulmonary edema. Similar scarring noted at the right lung base. The visualized bony structures of the thorax are unremarkable. IMPRESSION: Emphysema with vascular congestion. No acute cardiopulmonary findings. Electronically Signed   By: Misty Stanley M.D.   On: 04/23/2022 18:44   DG Hip Unilat W or Wo Pelvis 2-3 Views Right  Result Date: 04/23/2022 CLINICAL DATA:  Pain after fall EXAM: DG HIP (WITH OR WITHOUT PELVIS) 2V RIGHT COMPARISON:  None Available. FINDINGS: Displaced subcapital right femoral neck fracture. Preserved joint spaces. Osteopenia. Mild sclerosis along the pubic symphysis. IMPRESSION: Displaced impacted subcapital femoral neck fracture. Electronically Signed   By: Jill Side M.D.   On: 04/23/2022 18:38  DG Knee Complete 4 Views Left  Result Date: 04/23/2022 CLINICAL DATA:  Pain after fall EXAM: LEFT KNEE - COMPLETE 4 VIEW COMPARISON:  None Available. FINDINGS: Chondrocalcinosis identified greatest of the lateral compartment. No fracture or dislocation. Osteopenia. No joint effusion. Preserved joint spaces. IMPRESSION: Chondrocalcinosis.  Osteopenia Electronically Signed   By: Jill Side M.D.   On: 04/23/2022 18:37   DG Knee Complete 4 Views Right  Result Date: 04/23/2022 CLINICAL DATA:  Unwitnessed fall. EXAM: RIGHT KNEE - COMPLETE 4+ VIEW COMPARISON:  None Available. FINDINGS: Exam is limited by patient positioning. Within this limitation, no fracture or dislocation is evident. No evidence for joint effusion. Degenerative changes are seen in all 3 compartments. No worrisome lytic or sclerotic osseous abnormality. IMPRESSION: 1. No acute bony findings.  No joint effusion. 2. Degenerative changes in all 3 compartments. Electronically Signed   By: Misty Stanley M.D.   On: 04/23/2022 18:37   DG  Pelvis 1-2 Views  Result Date: 04/23/2022 CLINICAL DATA:  Pain after fall EXAM: PELVIS - 1-2 VIEW COMPARISON:  X-ray 2007.  CT 07/06/2021 of the abdomen and pelvis FINDINGS: Subcapital displaced right femoral neck fracture identified. No additional fracture or dislocation. Osteopenia. IMPRESSION: Subcapital displaced right femoral neck fracture . Electronically Signed   By: Jill Side M.D.   On: 04/23/2022 18:36    EKG: Independently reviewed.  NSR with rate 97 with 1st degree AV block ; nonspecific ST changes with no evidence of acute ischemia 1st degree block new  Labs on Admission: I have personally reviewed the available labs and imaging studies at the time of the admission.  Pertinent labs:   wbc: 17.1,  sodium: 130,  Assessment and Plan: Principal Problem:   Closed right hip fracture ([place]) Active Problems:   SIRS (systemic inflammatory response syndrome) ([facility])   Hyponatremia   Hypothyroidism   Essential hypertension   Anxiety with depression   Memory loss    Assessment and Plan: * Closed right hip fracture ([place]) 88 year old presenting with unwitnessed fall and found to have displaced right impacted subcapital femoral neck fracture -admit to tele -ortho consulted, Dr. Alvan Dame -NPO at midnight -family discussing surgery -hip fracture order set utilized -NWB -ice -pain medication prn -SCDs  SIRS (systemic inflammatory response syndrome) ([place]) Initially met SIRS criteria, but no evidence of infection; however, UA pending Vitals normalized Leukocytosis likely reactive. Will trend and follow fever curve   Hyponatremia Daughter states long history of hyponatremia Has slowly trended downward over last year Check urine studies, TSH, osmolality Strict I/O Trend  Watch sodium if zoloft is resumed   Hypothyroidism TSH pending Continue home synthroid   Essential hypertension On no medication  Blood pressure to goal for age Continue to monitor   Anxiety with  depression Has not had her xanax, but this helps a lot Start back xanax .'25mg'$  Bid prn Just started zoloft today, unsure if any SE that could have contributed to fall, hold for now   Memory loss Delirium precautions     Advance Care Planning:   Code Status: DNR   Consults: ortho: Dr. Alvan Dame   DVT Prophylaxis: SCDs   Family Communication: daughter at bedside   Severity of Illness: The appropriate patient status for this patient is INPATIENT. Inpatient status is judged to be reasonable and necessary in order to provide the required intensity of service to ensure the patient's safety. The patient's presenting symptoms, physical exam findings, and initial radiographic and laboratory data in the context of  their chronic comorbidities is felt to place them at high risk for further clinical deterioration. Furthermore, it is not anticipated that the patient will be medically stable for discharge from the hospital within 2 midnights of admission.   * I certify that at the point of admission it is my clinical judgment that the patient will require inpatient hospital care spanning beyond 2 midnights from the point of admission due to high intensity of service, high risk for further deterioration and high frequency of surveillance required.*  Author: Orma Flaming, MD 04/24/2022 12:44 AM  For on call review [email or web address].

## 2022-04-23 NOTE — Assessment & Plan Note (Addendum)
Daughter states long history of hyponatremia, currently seems stable around her baseline.  Can be secondary to Zoloft. -Continue to monitor

## 2022-04-24 DIAGNOSIS — S72001A Fracture of unspecified part of neck of right femur, initial encounter for closed fracture: Secondary | ICD-10-CM | POA: Diagnosis not present

## 2022-04-24 LAB — CBC
HCT: 42 % (ref 36.0–46.0)
Hemoglobin: 13.8 g/dL (ref 12.0–15.0)
MCH: 27.4 pg (ref 26.0–34.0)
MCHC: 32.9 g/dL (ref 30.0–36.0)
MCV: 83.5 fL (ref 80.0–100.0)
Platelets: 354 10*3/uL (ref 150–400)
RBC: 5.03 MIL/uL (ref 3.87–5.11)
RDW: 13.4 % (ref 11.5–15.5)
WBC: 10.2 10*3/uL (ref 4.0–10.5)
nRBC: 0 % (ref 0.0–0.2)

## 2022-04-24 LAB — BASIC METABOLIC PANEL
Anion gap: 6 (ref 5–15)
BUN: 13 mg/dL (ref 8–23)
CO2: 29 mmol/L (ref 22–32)
Calcium: 9.2 mg/dL (ref 8.9–10.3)
Chloride: 97 mmol/L — ABNORMAL LOW (ref 98–111)
Creatinine, Ser: 0.83 mg/dL (ref 0.44–1.00)
GFR, Estimated: >60 mL/min (ref >60)
Glucose, Bld: 129 mg/dL — ABNORMAL HIGH (ref 70–99)
Potassium: 4.2 mmol/L (ref 3.5–5.1)
Sodium: 132 mmol/L — ABNORMAL LOW (ref 135–145)

## 2022-04-24 LAB — TSH: TSH: 5.086 u[IU]/mL — ABNORMAL HIGH (ref 0.350–4.500)

## 2022-04-24 LAB — OSMOLALITY: Osmolality: 287 mOsm/kg (ref 275–295)

## 2022-04-24 LAB — ABO/RH: ABO/RH(D): O POS

## 2022-04-24 MED ORDER — ENSURE PRE-SURGERY PO LIQD
296.0000 mL | Freq: Once | ORAL | Status: AC
  Administered 2022-04-24: 296 mL via ORAL
  Filled 2022-04-24: qty 296

## 2022-04-24 NOTE — Progress Notes (Signed)
  Progress Note   Patient: Kara Davis ZDG:[id number] DOB: 09-08-1934 DOA: 04/23/2022     1 DOS: the patient was seen and examined on 04/24/2022   Brief hospital course: Taken from H&P.  Kara Davis is a 88 y.o. female with medical history significant of HTN, HLD, GERD, hypothyroidism, anxiety with depression, dementia who presented to ED after a fall. Her daughter left at 12pm and her caregiver got there at 3pm. Sometime between then she states she was walking in the hall and hit a slick spot and slipped and fell. She was in the dining room on the ground when her caregiver got there. She got her off the floor and into a chair.  She then called EMS.   ER Course:  vitals: afebrile, bp: 170/72, HR: 105, RR: 20, oxygen: 95% RA Pertinent labs: wbc: 17.1, sodium: 130,  CXR: emphysema with vascular congestion. No acute finding.  Hip xray: displaced  right impacted subcapital femoral neck fracture Knee xray: chondrocalcinosis. Orthopedic surgery was consulted.  1/20: Hemodynamically stable.  Labs with sodium of 132, leukocytosis resolved    Assessment and Plan: * Closed right hip fracture [facility]) 88 year old presenting with unwitnessed fall and found to have displaced right impacted subcapital femoral neck fracture. Orthopedic surgery was consulted and she will be going to the OR tomorrow morning. -Continue with pain management  SIRS (systemic inflammatory response syndrome) (HCC) Initially met SIRS criteria, but no evidence of infection; however, UA pending Vitals normalized Leukocytosis likely reactive and has been resolved.  Hyponatremia Daughter states long history of hyponatremia, currently seems stable around her baseline.  Can be secondary to Zoloft. -Continue to monitor  Hypothyroidism TSH pending Continue home synthroid   Essential hypertension On no medication  Blood pressure to goal for age Continue to monitor   Anxiety with depression Has not had her xanax, but  this helps a lot Start back xanax .'25mg'$  Bid prn -Holding Zoloft as it can be contributory to hyponatremia  Memory loss Delirium precautions    Subjective: Patient was seen and examined today.  No pain while resting in bed.  Physical Exam: Vitals:   04/24/22 0956 04/24/22 1100 04/24/22 1200 04/24/22 1300  BP: (!) 123/58 (!) 112/59 114/64 (!) 120/58  Pulse: 69 66 72 68  Resp: (!) '24 16 20 13  '$ Temp: 98 F (36.7 C)     TempSrc: Oral     SpO2: 93% 92% 93% 90%   General.  Frail elderly lady, in no acute distress. Pulmonary.  Lungs clear bilaterally, normal respiratory effort. CV.  Regular rate and rhythm, no JVD, rub or murmur. Abdomen.  Soft, nontender, nondistended, BS positive. CNS.  Alert and oriented .  No focal neurologic deficit. Extremities.  No edema, no cyanosis, pulses intact and symmetrical. Psychiatry.  Judgment and insight appears normal.   Data Reviewed: Prior data reviewed  Family Communication: Discussed with son at bedside  Disposition: Status is: Inpatient Remains inpatient appropriate because: Severity of illness  Planned Discharge Destination:  To be determined  DVT prophylaxis.  SCDs Time spent: 45 minutes  This record has been created using Systems analyst. Errors have been sought and corrected,but may not always be located. Such creation errors do not reflect on the standard of care.   Author: Lorella Nimrod, MD 04/24/2022 1:51 PM  For on call review [email or web address].

## 2022-04-24 NOTE — Consult Note (Signed)
Reason for Consult:right hip fracture Referring Physician: Reesa Chew, MD  Kara Davis is an 88 y.o. female.  HPI: Kara Davis is a 88 y.o. female with medical history significant of HTN, HLD, GERD, hypothyroidism, anxiety with depression, dementia who presented to ED after a fall. Her daughter left at 12pm and her caregiver got there at 3pm. Sometime between then she states she was walking in the hall and hit a slick spot and slipped and fell. She was in the dining room on the ground when her caregiver got there. She got her off the floor and into a chair.  She then called EMS.   No other injuries reported We discussed her desire to remain as independent as possible  Past Medical History:  Diagnosis Date   Aneurysm ([place])    over left eye   Cataracts, bilateral    Dementia ([place])    DEPRESSION 12/06/2006   Dr. Clovis Pu   DIVERTICULOSIS, COLON 12/06/2006   GERD 12/06/2006   HYPERCHOLESTEROLEMIA 07/27/2007   HYPERTENSION 07/27/2007   Irritable bowel syndrome 12/06/2006   Osteoporosis    rx refused   Palpitations 05/23/2008   Tubular adenoma 09/09/2009   VITAMIN D DEFICIENCY 05/23/2008    Past Surgical History:  Procedure Laterality Date   APPENDECTOMY     CATARACT EXTRACTION     [place]   secondary to abd. pain   thumb surgery     for fracture    Family History  Problem Relation Age of Onset   Heart disease Mother    Diabetes Brother    Prostate cancer Brother    Colon cancer Neg Hx     Social History:  reports that she has quit smoking. Her smoking use included cigarettes. She has never used smokeless tobacco. She reports that she does not currently use alcohol. She reports that she does not use drugs.  Allergies: No Known Allergies  Medications: I have reviewed the patient's current medications. Scheduled:  levothyroxine  75 mcg Oral QAC breakfast    Results for orders placed or performed during the hospital encounter of 04/23/22 (from the  past 24 hour(s))  Type and screen [place]     Status: None   Collection Time: 04/23/22  6:45 PM  Result Value Ref Range   ABO/RH(D) O POS    Antibody Screen NEG    Sample Expiration      04/26/2022,2359 Performed at [facility], [place] [address], [place], [place] [address]   CBC with Differential     Status: Abnormal   Collection Time: 04/23/22  6:58 PM  Result Value Ref Range   WBC 17.1 (H) 4.0 - 10.5 K/uL   RBC 4.96 3.87 - 5.11 MIL/uL   Hemoglobin 13.7 12.0 - 15.0 g/dL   HCT 41.2 36.0 - 46.0 %   MCV 83.1 80.0 - 100.0 fL   MCH 27.6 26.0 - 34.0 pg   MCHC 33.3 30.0 - 36.0 g/dL   RDW 13.3 11.5 - 15.5 %   Platelets 340 150 - 400 K/uL   nRBC 0.0 0.0 - 0.2 %   Neutrophils Relative % 88 %   Neutro Abs 15.1 (H) 1.7 - 7.7 K/uL   Lymphocytes Relative 2 %   Lymphs Abs 0.3 (L) 0.7 - 4.0 K/uL   Monocytes Relative 9 %   Monocytes Absolute 1.6 (H) 0.1 - 1.0 K/uL   Eosinophils Relative 0 %   Eosinophils Absolute 0.0 0.0 - 0.5 K/uL  Basophils Relative 0 %   Basophils Absolute 0.0 0.0 - 0.1 K/uL   Immature Granulocytes 1 %   Abs Immature Granulocytes 0.11 (H) 0.00 - 0.07 K/uL  Basic metabolic panel     Status: Abnormal   Collection Time: 04/23/22  6:58 PM  Result Value Ref Range   Sodium 130 (L) 135 - 145 mmol/L   Potassium 3.6 3.5 - 5.1 mmol/L   Chloride 96 (L) 98 - 111 mmol/L   CO2 25 22 - 32 mmol/L   Glucose, Bld 164 (H) 70 - 99 mg/dL   BUN 14 8 - 23 mg/dL   Creatinine, Ser 0.85 0.44 - 1.00 mg/dL   Calcium 8.8 (L) 8.9 - 10.3 mg/dL   GFR, Estimated >60 >60 mL/min   Anion gap 9 5 - 15  CK     Status: None   Collection Time: 04/23/22  6:58 PM  Result Value Ref Range   Total CK 100 38 - 234 U/L  Protime-INR     Status: None   Collection Time: 04/23/22  6:58 PM  Result Value Ref Range   Prothrombin Time 13.0 11.4 - 15.2 seconds   INR 1.0 0.8 - 1.2  Brain natriuretic peptide     Status: None   Collection Time: 04/23/22  8:26 PM  Result  Value Ref Range   B Natriuretic Peptide 91.3 0.0 - 100.0 pg/mL  Urinalysis, Routine w reflex microscopic Urine, Clean Catch     Status: Abnormal   Collection Time: 04/23/22 10:42 PM  Result Value Ref Range   Color, Urine YELLOW YELLOW   APPearance HAZY (A) CLEAR   Specific Gravity, Urine 1.013 1.005 - 1.030   pH 7.0 5.0 - 8.0   Glucose, UA NEGATIVE NEGATIVE mg/dL   Hgb urine dipstick NEGATIVE NEGATIVE   Bilirubin Urine NEGATIVE NEGATIVE   Ketones, ur NEGATIVE NEGATIVE mg/dL   Protein, ur 30 (A) NEGATIVE mg/dL   Nitrite NEGATIVE NEGATIVE   Leukocytes,Ua TRACE (A) NEGATIVE   RBC / HPF 0-5 0 - 5 RBC/hpf   WBC, UA 0-5 0 - 5 WBC/hpf   Bacteria, UA NONE SEEN NONE SEEN   Squamous Epithelial / HPF 0-5 0 - 5 /HPF   Crystals PRESENT (A) NEGATIVE  Sodium, urine, random     Status: None   Collection Time: 04/23/22 10:43 PM  Result Value Ref Range   Sodium, Ur 73 mmol/L  ABO/Rh     Status: None   Collection Time: 04/24/22  5:46 AM  Result Value Ref Range   ABO/RH(D)      O POS Performed at [facility], [place] [address], [place], [place] [address]   TSH     Status: Abnormal   Collection Time: 04/24/22  5:46 AM  Result Value Ref Range   TSH 5.086 (H) 0.350 - 4.500 uIU/mL  CBC     Status: None   Collection Time: 04/24/22  5:46 AM  Result Value Ref Range   WBC 10.2 4.0 - 10.5 K/uL   RBC 5.03 3.87 - 5.11 MIL/uL   Hemoglobin 13.8 12.0 - 15.0 g/dL   HCT 42.0 36.0 - 46.0 %   MCV 83.5 80.0 - 100.0 fL   MCH 27.4 26.0 - 34.0 pg   MCHC 32.9 30.0 - 36.0 g/dL   RDW 13.4 11.5 - 15.5 %   Platelets 354 150 - 400 K/uL   nRBC 0.0 0.0 - 0.2 %  Basic metabolic panel     Status: Abnormal  Collection Time: 04/24/22  5:46 AM  Result Value Ref Range   Sodium 132 (L) 135 - 145 mmol/L   Potassium 4.2 3.5 - 5.1 mmol/L   Chloride 97 (L) 98 - 111 mmol/L   CO2 29 22 - 32 mmol/L   Glucose, Bld 129 (H) 70 - 99 mg/dL   BUN 13 8 - 23 mg/dL   Creatinine, Ser 0.83 0.44 - 1.00 mg/dL    Calcium 9.2 8.9 - 10.3 mg/dL   GFR, Estimated >60 >60 mL/min   Anion gap 6 5 - 15     X-ray: CLINICAL DATA:  Pain after fall   EXAM: DG HIP (WITH OR WITHOUT PELVIS) 2V RIGHT   COMPARISON:  None Available.   FINDINGS: Displaced subcapital right femoral neck fracture. Preserved joint spaces. Osteopenia. Mild sclerosis along the pubic symphysis.   IMPRESSION: Displaced impacted subcapital femoral neck fracture.  ROS: As noted per HPI  Blood pressure (!) 127/56, pulse 68, temperature 97.9 F (36.6 C), temperature source Oral, resp. rate (!) 22, SpO2 (!) 88 %.  Physical Exam: General:  Appears calm and comfortable and is in NAD Eyes:  PERRL, EOMI, normal lids, iris ENT:  grossly normal hearing, lips & tongue, dry  mucous membranes, poor dentition.  Neck:  no LAD, masses or thyromegaly; no carotid bruits Cardiovascular:  RRR, no m/r/g. No LE edema.  Respiratory:   CTA bilaterally with no wheezes/rales/rhonchi.  Normal respiratory effort. Abdomen:  soft, NT, ND, NABS Back:   normal alignment, no CVAT Skin:  no rash or induration seen on limited exam Musculoskeletal: RLE: shortened and externally rotated. No brusiing or edema in ankle or knee joint. TTP along superior lateral hip/greater trochanteric process. Sensation intact. Pedal pulses intact.  Lower extremity:  No LE edema.  Limited foot exam with no ulcerations.  2+ distal pulses. Psychiatric:  grossly normal mood and affect, speech fluent and appropriate, AOx1 Neurologic:  CN 2-12 grossly intact, moves all extremities in coordinated fashion, sensation intac  Assessment/Plan: Right hip displaced femoral neck fracture  Plan: NPO after midnight Plan to take her to the OR 1/21 am for a right total hip replacement for management of her femoral neck fracture Goals and post operative course review with patient and her daughter Consent ordered  Mauri Pole 04/24/2022, 3:21 PM

## 2022-04-24 NOTE — Progress Notes (Signed)
Patient seen and evaluated by Dr. Alvan Dame. Right femoral neck fracture which will require OR for right total hip arthroplasty. OR tomorrow. Regular diet today, pain control. NPO after midnight.  Full consult note to follow.  Costella Hatcher, PA-C

## 2022-04-24 NOTE — H&P (View-Only) (Signed)
Reason for Consult:right hip fracture Referring Physician: Reesa Chew, MD  Kara Davis is an 88 y.o. female.  HPI: Kara Davis is a 88 y.o. female with medical history significant of HTN, HLD, GERD, hypothyroidism, anxiety with depression, dementia who presented to ED after a fall. Her daughter left at 12pm and her caregiver got there at 3pm. Sometime between then she states she was walking in the hall and hit a slick spot and slipped and fell. She was in the dining room on the ground when her caregiver got there. She got her off the floor and into a chair.  She then called EMS.   No other injuries reported We discussed her desire to remain as independent as possible  Past Medical History:  Diagnosis Date   Aneurysm ([place])    over left eye   Cataracts, bilateral    Dementia ([place])    DEPRESSION 12/06/2006   Dr. Clovis Pu   DIVERTICULOSIS, COLON 12/06/2006   GERD 12/06/2006   HYPERCHOLESTEROLEMIA 07/27/2007   HYPERTENSION 07/27/2007   Irritable bowel syndrome 12/06/2006   Osteoporosis    rx refused   Palpitations 05/23/2008   Tubular adenoma 09/09/2009   VITAMIN D DEFICIENCY 05/23/2008    Past Surgical History:  Procedure Laterality Date   APPENDECTOMY     CATARACT EXTRACTION     [place]   secondary to abd. pain   thumb surgery     for fracture    Family History  Problem Relation Age of Onset   Heart disease Mother    Diabetes Brother    Prostate cancer Brother    Colon cancer Neg Hx     Social History:  reports that she has quit smoking. Her smoking use included cigarettes. She has never used smokeless tobacco. She reports that she does not currently use alcohol. She reports that she does not use drugs.  Allergies: No Known Allergies  Medications: I have reviewed the patient's current medications. Scheduled:  levothyroxine  75 mcg Oral QAC breakfast    Results for orders placed or performed during the hospital encounter of 04/23/22 (from the  past 24 hour(s))  Type and screen [place]     Status: None   Collection Time: 04/23/22  6:45 PM  Result Value Ref Range   ABO/RH(D) O POS    Antibody Screen NEG    Sample Expiration      04/26/2022,2359 Performed at [facility], [place] [address], [place],  [address]   CBC with Differential     Status: Abnormal   Collection Time: 04/23/22  6:58 PM  Result Value Ref Range   WBC 17.1 (H) 4.0 - 10.5 K/uL   RBC 4.96 3.87 - 5.11 MIL/uL   Hemoglobin 13.7 12.0 - 15.0 g/dL   HCT 41.2 36.0 - 46.0 %   MCV 83.1 80.0 - 100.0 fL   MCH 27.6 26.0 - 34.0 pg   MCHC 33.3 30.0 - 36.0 g/dL   RDW 13.3 11.5 - 15.5 %   Platelets 340 150 - 400 K/uL   nRBC 0.0 0.0 - 0.2 %   Neutrophils Relative % 88 %   Neutro Abs 15.1 (H) 1.7 - 7.7 K/uL   Lymphocytes Relative 2 %   Lymphs Abs 0.3 (L) 0.7 - 4.0 K/uL   Monocytes Relative 9 %   Monocytes Absolute 1.6 (H) 0.1 - 1.0 K/uL   Eosinophils Relative 0 %   Eosinophils Absolute 0.0 0.0 - 0.5 K/uL  Basophils Relative 0 %   Basophils Absolute 0.0 0.0 - 0.1 K/uL   Immature Granulocytes 1 %   Abs Immature Granulocytes 0.11 (H) 0.00 - 0.07 K/uL  Basic metabolic panel     Status: Abnormal   Collection Time: 04/23/22  6:58 PM  Result Value Ref Range   Sodium 130 (L) 135 - 145 mmol/L   Potassium 3.6 3.5 - 5.1 mmol/L   Chloride 96 (L) 98 - 111 mmol/L   CO2 25 22 - 32 mmol/L   Glucose, Bld 164 (H) 70 - 99 mg/dL   BUN 14 8 - 23 mg/dL   Creatinine, Ser 0.85 0.44 - 1.00 mg/dL   Calcium 8.8 (L) 8.9 - 10.3 mg/dL   GFR, Estimated >60 >60 mL/min   Anion gap 9 5 - 15  CK     Status: None   Collection Time: 04/23/22  6:58 PM  Result Value Ref Range   Total CK 100 38 - 234 U/L  Protime-INR     Status: None   Collection Time: 04/23/22  6:58 PM  Result Value Ref Range   Prothrombin Time 13.0 11.4 - 15.2 seconds   INR 1.0 0.8 - 1.2  Brain natriuretic peptide     Status: None   Collection Time: 04/23/22  8:26 PM  Result  Value Ref Range   B Natriuretic Peptide 91.3 0.0 - 100.0 pg/mL  Urinalysis, Routine w reflex microscopic Urine, Clean Catch     Status: Abnormal   Collection Time: 04/23/22 10:42 PM  Result Value Ref Range   Color, Urine YELLOW YELLOW   APPearance HAZY (A) CLEAR   Specific Gravity, Urine 1.013 1.005 - 1.030   pH 7.0 5.0 - 8.0   Glucose, UA NEGATIVE NEGATIVE mg/dL   Hgb urine dipstick NEGATIVE NEGATIVE   Bilirubin Urine NEGATIVE NEGATIVE   Ketones, ur NEGATIVE NEGATIVE mg/dL   Protein, ur 30 (A) NEGATIVE mg/dL   Nitrite NEGATIVE NEGATIVE   Leukocytes,Ua TRACE (A) NEGATIVE   RBC / HPF 0-5 0 - 5 RBC/hpf   WBC, UA 0-5 0 - 5 WBC/hpf   Bacteria, UA NONE SEEN NONE SEEN   Squamous Epithelial / HPF 0-5 0 - 5 /HPF   Crystals PRESENT (A) NEGATIVE  Sodium, urine, random     Status: None   Collection Time: 04/23/22 10:43 PM  Result Value Ref Range   Sodium, Ur 73 mmol/L  ABO/Rh     Status: None   Collection Time: 04/24/22  5:46 AM  Result Value Ref Range   ABO/RH(D)      O POS Performed at [facility], [place] [address], [place], [place] [address]   TSH     Status: Abnormal   Collection Time: 04/24/22  5:46 AM  Result Value Ref Range   TSH 5.086 (H) 0.350 - 4.500 uIU/mL  CBC     Status: None   Collection Time: 04/24/22  5:46 AM  Result Value Ref Range   WBC 10.2 4.0 - 10.5 K/uL   RBC 5.03 3.87 - 5.11 MIL/uL   Hemoglobin 13.8 12.0 - 15.0 g/dL   HCT 42.0 36.0 - 46.0 %   MCV 83.5 80.0 - 100.0 fL   MCH 27.4 26.0 - 34.0 pg   MCHC 32.9 30.0 - 36.0 g/dL   RDW 13.4 11.5 - 15.5 %   Platelets 354 150 - 400 K/uL   nRBC 0.0 0.0 - 0.2 %  Basic metabolic panel     Status: Abnormal  Collection Time: 04/24/22  5:46 AM  Result Value Ref Range   Sodium 132 (L) 135 - 145 mmol/L   Potassium 4.2 3.5 - 5.1 mmol/L   Chloride 97 (L) 98 - 111 mmol/L   CO2 29 22 - 32 mmol/L   Glucose, Bld 129 (H) 70 - 99 mg/dL   BUN 13 8 - 23 mg/dL   Creatinine, Ser 0.83 0.44 - 1.00 mg/dL    Calcium 9.2 8.9 - 10.3 mg/dL   GFR, Estimated >60 >60 mL/min   Anion gap 6 5 - 15     X-ray: CLINICAL DATA:  Pain after fall   EXAM: DG HIP (WITH OR WITHOUT PELVIS) 2V RIGHT   COMPARISON:  None Available.   FINDINGS: Displaced subcapital right femoral neck fracture. Preserved joint spaces. Osteopenia. Mild sclerosis along the pubic symphysis.   IMPRESSION: Displaced impacted subcapital femoral neck fracture.  ROS: As noted per HPI  Blood pressure (!) 127/56, pulse 68, temperature 97.9 F (36.6 C), temperature source Oral, resp. rate (!) 22, SpO2 (!) 88 %.  Physical Exam: General:  Appears calm and comfortable and is in NAD Eyes:  PERRL, EOMI, normal lids, iris ENT:  grossly normal hearing, lips & tongue, dry  mucous membranes, poor dentition.  Neck:  no LAD, masses or thyromegaly; no carotid bruits Cardiovascular:  RRR, no m/r/g. No LE edema.  Respiratory:   CTA bilaterally with no wheezes/rales/rhonchi.  Normal respiratory effort. Abdomen:  soft, NT, ND, NABS Back:   normal alignment, no CVAT Skin:  no rash or induration seen on limited exam Musculoskeletal: RLE: shortened and externally rotated. No brusiing or edema in ankle or knee joint. TTP along superior lateral hip/greater trochanteric process. Sensation intact. Pedal pulses intact.  Lower extremity:  No LE edema.  Limited foot exam with no ulcerations.  2+ distal pulses. Psychiatric:  grossly normal mood and affect, speech fluent and appropriate, AOx1 Neurologic:  CN 2-12 grossly intact, moves all extremities in coordinated fashion, sensation intac  Assessment/Plan: Right hip displaced femoral neck fracture  Plan: NPO after midnight Plan to take her to the OR 1/21 am for a right total hip replacement for management of her femoral neck fracture Goals and post operative course review with patient and her daughter Consent ordered  Mauri Pole 04/24/2022, 3:21 PM

## 2022-04-24 NOTE — Hospital Course (Addendum)
Taken from H&P.  Kara Davis is a 88 y.o. female with medical history significant of HTN, HLD, GERD, hypothyroidism, anxiety with depression, dementia who presented to ED after a fall. Her daughter left at 12pm and her caregiver got there at 3pm. Sometime between then she states she was walking in the hall and hit a slick spot and slipped and fell. She was in the dining room on the ground when her caregiver got there. She got her off the floor and into a chair.  She then called EMS.   ER Course:  vitals: afebrile, bp: 170/72, HR: 105, RR: 20, oxygen: 95% RA Pertinent labs: wbc: 17.1, sodium: 130,  CXR: emphysema with vascular congestion. No acute finding.  Hip xray: displaced  right impacted subcapital femoral neck fracture Knee xray: chondrocalcinosis. Orthopedic surgery was consulted.  1/20: Hemodynamically stable.  Labs with sodium of 132, leukocytosis resolved. Going for surgery tomorrow morning.  1/21: Hemodynamically stable.  S/p total right hip replacement through an anterior approach by orthopedic surgery this morning.  1/22: Vital stable.  Hemoglobin decreased to 10.2 from 13.8 postsurgically.  Starting on iron supplement.  Sodium also decreased to 128, patient was on normal saline postsurgically which was discontinued today, will monitor sodium. PT and OT are recommending SNF

## 2022-04-25 ENCOUNTER — Inpatient Hospital Stay (HOSPITAL_COMMUNITY): Payer: Medicare HMO

## 2022-04-25 ENCOUNTER — Encounter (HOSPITAL_COMMUNITY)
Admission: EM | Disposition: A | Discharge status: Skilled Nursing Facility | Payer: Self-pay | Source: Home / Self Care | Attending: Family Medicine

## 2022-04-25 ENCOUNTER — Encounter (HOSPITAL_COMMUNITY): Payer: Self-pay | Admitting: Family Medicine

## 2022-04-25 ENCOUNTER — Other Ambulatory Visit: Payer: Self-pay

## 2022-04-25 ENCOUNTER — Inpatient Hospital Stay (HOSPITAL_COMMUNITY): Payer: Medicare HMO | Admitting: Certified Registered Nurse Anesthetist

## 2022-04-25 DIAGNOSIS — I10 Essential (primary) hypertension: Secondary | ICD-10-CM

## 2022-04-25 DIAGNOSIS — Z87891 Personal history of nicotine dependence: Secondary | ICD-10-CM

## 2022-04-25 DIAGNOSIS — Z96641 Presence of right artificial hip joint: Secondary | ICD-10-CM

## 2022-04-25 DIAGNOSIS — M1611 Unilateral primary osteoarthritis, right hip: Secondary | ICD-10-CM | POA: Diagnosis not present

## 2022-04-25 DIAGNOSIS — E039 Hypothyroidism, unspecified: Secondary | ICD-10-CM | POA: Diagnosis not present

## 2022-04-25 DIAGNOSIS — D638 Anemia in other chronic diseases classified elsewhere: Secondary | ICD-10-CM | POA: Diagnosis not present

## 2022-04-25 DIAGNOSIS — S72001A Fracture of unspecified part of neck of right femur, initial encounter for closed fracture: Secondary | ICD-10-CM | POA: Diagnosis not present

## 2022-04-25 HISTORY — PX: TOTAL HIP ARTHROPLASTY: SHX124

## 2022-04-25 LAB — OSMOLALITY, URINE: Osmolality, Ur: 460 mOsm/kg (ref 300–900)

## 2022-04-25 LAB — SURGICAL PCR SCREEN
MRSA, PCR: NEGATIVE
Staphylococcus aureus: NEGATIVE

## 2022-04-25 SURGERY — ARTHROPLASTY, HIP, TOTAL, ANTERIOR APPROACH
Anesthesia: General | Site: Hip | Laterality: Right

## 2022-04-25 MED ORDER — BUPIVACAINE HCL (PF) 0.25 % IJ SOLN
INTRAMUSCULAR | Status: DC | PRN
  Administered 2022-04-25: 30 mL

## 2022-04-25 MED ORDER — DEXAMETHASONE SODIUM PHOSPHATE 10 MG/ML IJ SOLN
INTRAMUSCULAR | Status: AC
  Filled 2022-04-25: qty 1

## 2022-04-25 MED ORDER — POVIDONE-IODINE 10 % EX SWAB: 2.0000 | Freq: Once | CUTANEOUS | Status: DC

## 2022-04-25 MED ORDER — FENTANYL CITRATE (PF) 100 MCG/2ML IJ SOLN
INTRAMUSCULAR | Status: DC | PRN
  Administered 2022-04-25: 25 ug via INTRAVENOUS
  Administered 2022-04-25 (×2): 50 ug via INTRAVENOUS
  Administered 2022-04-25: 25 ug via INTRAVENOUS
  Administered 2022-04-25: 50 ug via INTRAVENOUS

## 2022-04-25 MED ORDER — METHOCARBAMOL 500 MG IVPB - SIMPLE MED
500.0000 mg | Freq: Four times a day (QID) | INTRAVENOUS | Status: DC | PRN
  Administered 2022-04-25: 500 mg via INTRAVENOUS
  Filled 2022-04-25: qty 500
  Filled 2022-04-25: qty 55

## 2022-04-25 MED ORDER — TRANEXAMIC ACID-NACL 1000-0.7 MG/100ML-% IV SOLN
INTRAVENOUS | Status: AC
  Filled 2022-04-25: qty 100

## 2022-04-25 MED ORDER — FENTANYL CITRATE (PF) 100 MCG/2ML IJ SOLN
INTRAMUSCULAR | Status: AC
  Filled 2022-04-25: qty 2

## 2022-04-25 MED ORDER — SODIUM CHLORIDE (PF) 0.9 % IJ SOLN
INTRAMUSCULAR | Status: DC | PRN
  Administered 2022-04-25: 30 mL via INTRAVENOUS

## 2022-04-25 MED ORDER — POLYETHYLENE GLYCOL 3350 17 G PO PACK
17.0000 g | PACK | Freq: Two times a day (BID) | ORAL | Status: DC
  Administered 2022-04-25 – 2022-04-29 (×3): 17 g via ORAL
  Filled 2022-04-25 (×8): qty 1

## 2022-04-25 MED ORDER — TRANEXAMIC ACID-NACL 1000-0.7 MG/100ML-% IV SOLN
1000.0000 mg | Freq: Once | INTRAVENOUS | Status: AC
  Administered 2022-04-25: 1000 mg via INTRAVENOUS
  Filled 2022-04-25: qty 100

## 2022-04-25 MED ORDER — CHLORHEXIDINE GLUCONATE 4 % EX LIQD
60.0000 mL | Freq: Once | CUTANEOUS | Status: AC
  Administered 2022-04-25: 4 via TOPICAL
  Filled 2022-04-25: qty 60

## 2022-04-25 MED ORDER — KETOROLAC TROMETHAMINE 30 MG/ML IJ SOLN
INTRAMUSCULAR | Status: DC | PRN
  Administered 2022-04-25: 30 mg

## 2022-04-25 MED ORDER — ONDANSETRON HCL 4 MG/2ML IJ SOLN
INTRAMUSCULAR | Status: DC | PRN
  Administered 2022-04-25: 4 mg via INTRAVENOUS

## 2022-04-25 MED ORDER — CEFAZOLIN SODIUM-DEXTROSE 2-4 GM/100ML-% IV SOLN
2.0000 g | INTRAVENOUS | Status: AC
  Administered 2022-04-25: 2 g via INTRAVENOUS

## 2022-04-25 MED ORDER — DEXAMETHASONE SODIUM PHOSPHATE 10 MG/ML IJ SOLN
INTRAMUSCULAR | Status: DC | PRN
  Administered 2022-04-25: 10 mg via INTRAVENOUS

## 2022-04-25 MED ORDER — ROCURONIUM BROMIDE 100 MG/10ML IV SOLN
INTRAVENOUS | Status: DC | PRN
  Administered 2022-04-25: 20 mg via INTRAVENOUS
  Administered 2022-04-25: 40 mg via INTRAVENOUS

## 2022-04-25 MED ORDER — EPINEPHRINE PF 1 MG/ML IJ SOLN
INTRAMUSCULAR | Status: DC | PRN
  Administered 2022-04-25: .15 mL

## 2022-04-25 MED ORDER — DEXAMETHASONE SODIUM PHOSPHATE 10 MG/ML IJ SOLN
10.0000 mg | Freq: Once | INTRAMUSCULAR | Status: DC
  Filled 2022-04-25: qty 1

## 2022-04-25 MED ORDER — ONDANSETRON HCL 4 MG/2ML IJ SOLN
INTRAMUSCULAR | Status: AC
  Filled 2022-04-25: qty 2

## 2022-04-25 MED ORDER — SODIUM CHLORIDE (PF) 0.9 % IJ SOLN
INTRAMUSCULAR | Status: AC
  Filled 2022-04-25: qty 50

## 2022-04-25 MED ORDER — ONDANSETRON HCL 4 MG/2ML IJ SOLN: 4.0000 mg | Freq: Four times a day (QID) | INTRAMUSCULAR | Status: DC | PRN

## 2022-04-25 MED ORDER — ASPIRIN 81 MG PO CHEW
81.0000 mg | CHEWABLE_TABLET | Freq: Two times a day (BID) | ORAL | Status: DC
  Administered 2022-04-25 – 2022-04-30 (×10): 81 mg via ORAL
  Filled 2022-04-25 (×11): qty 1

## 2022-04-25 MED ORDER — MORPHINE SULFATE (PF) 2 MG/ML IV SOLN: 0.5000 mg | INTRAVENOUS | Status: DC | PRN

## 2022-04-25 MED ORDER — ROCURONIUM BROMIDE 10 MG/ML (PF) SYRINGE
PREFILLED_SYRINGE | INTRAVENOUS | Status: AC
  Filled 2022-04-25: qty 10

## 2022-04-25 MED ORDER — BISACODYL 10 MG RE SUPP: 10.0000 mg | Freq: Every day | RECTAL | Status: DC | PRN

## 2022-04-25 MED ORDER — CEFAZOLIN SODIUM-DEXTROSE 2-4 GM/100ML-% IV SOLN
2.0000 g | Freq: Four times a day (QID) | INTRAVENOUS | Status: AC
  Administered 2022-04-25 (×2): 2 g via INTRAVENOUS
  Filled 2022-04-25 (×2): qty 100

## 2022-04-25 MED ORDER — BUPIVACAINE HCL 0.25 % IJ SOLN
INTRAMUSCULAR | Status: AC
  Filled 2022-04-25: qty 1

## 2022-04-25 MED ORDER — DIPHENHYDRAMINE HCL 12.5 MG/5ML PO ELIX: 12.5000 mg | ORAL_SOLUTION | ORAL | Status: DC | PRN

## 2022-04-25 MED ORDER — DROPERIDOL 2.5 MG/ML IJ SOLN: 0.6250 mg | Freq: Once | INTRAMUSCULAR | Status: DC | PRN

## 2022-04-25 MED ORDER — METHOCARBAMOL 500 MG PO TABS
500.0000 mg | ORAL_TABLET | Freq: Four times a day (QID) | ORAL | Status: DC | PRN
  Administered 2022-04-25 – 2022-04-29 (×3): 500 mg via ORAL
  Filled 2022-04-25 (×4): qty 1

## 2022-04-25 MED ORDER — ACETAMINOPHEN 325 MG PO TABS
325.0000 mg | ORAL_TABLET | Freq: Four times a day (QID) | ORAL | Status: DC | PRN
  Administered 2022-04-26: 650 mg via ORAL
  Filled 2022-04-25: qty 2

## 2022-04-25 MED ORDER — METOCLOPRAMIDE HCL 5 MG/ML IJ SOLN: 5.0000 mg | Freq: Three times a day (TID) | INTRAMUSCULAR | Status: DC | PRN

## 2022-04-25 MED ORDER — SUGAMMADEX SODIUM 200 MG/2ML IV SOLN
INTRAVENOUS | Status: DC | PRN
  Administered 2022-04-25: 200 mg via INTRAVENOUS

## 2022-04-25 MED ORDER — FENTANYL CITRATE PF 50 MCG/ML IJ SOSY
PREFILLED_SYRINGE | INTRAMUSCULAR | Status: AC
  Administered 2022-04-25: 50 ug via INTRAVENOUS
  Filled 2022-04-25: qty 2

## 2022-04-25 MED ORDER — MENTHOL 3 MG MT LOZG: 1.0000 | LOZENGE | OROMUCOSAL | Status: DC | PRN

## 2022-04-25 MED ORDER — PROPOFOL 10 MG/ML IV BOLUS
INTRAVENOUS | Status: DC | PRN
  Administered 2022-04-25: 70 mg via INTRAVENOUS

## 2022-04-25 MED ORDER — SODIUM CHLORIDE 0.9 % IV SOLN: INTRAVENOUS | Status: DC

## 2022-04-25 MED ORDER — 0.9 % SODIUM CHLORIDE (POUR BTL) OPTIME
TOPICAL | Status: DC | PRN
  Administered 2022-04-25: 1000 mL

## 2022-04-25 MED ORDER — STERILE WATER FOR IRRIGATION IR SOLN
Status: DC | PRN
  Administered 2022-04-25: 2000 mL

## 2022-04-25 MED ORDER — KETOROLAC TROMETHAMINE 30 MG/ML IJ SOLN
INTRAMUSCULAR | Status: AC
  Filled 2022-04-25: qty 1

## 2022-04-25 MED ORDER — PROPOFOL 10 MG/ML IV BOLUS
INTRAVENOUS | Status: AC
  Filled 2022-04-25: qty 20

## 2022-04-25 MED ORDER — EPINEPHRINE PF 1 MG/ML IJ SOLN
INTRAMUSCULAR | Status: AC
  Filled 2022-04-25: qty 1

## 2022-04-25 MED ORDER — HYDROCODONE-ACETAMINOPHEN 5-325 MG PO TABS
1.0000 | ORAL_TABLET | Freq: Four times a day (QID) | ORAL | Status: DC | PRN
  Administered 2022-04-25 – 2022-04-26 (×4): 2 via ORAL
  Administered 2022-04-26 – 2022-04-28 (×5): 1 via ORAL
  Administered 2022-04-29: 0.5 via ORAL
  Filled 2022-04-25: qty 1
  Filled 2022-04-25: qty 2
  Filled 2022-04-25: qty 1
  Filled 2022-04-25 (×5): qty 2
  Filled 2022-04-25: qty 1
  Filled 2022-04-25: qty 2
  Filled 2022-04-25 (×4): qty 1

## 2022-04-25 MED ORDER — TRANEXAMIC ACID-NACL 1000-0.7 MG/100ML-% IV SOLN
1000.0000 mg | INTRAVENOUS | Status: AC
  Administered 2022-04-25: 1000 mg via INTRAVENOUS

## 2022-04-25 MED ORDER — ONDANSETRON HCL 4 MG PO TABS: 4.0000 mg | ORAL_TABLET | Freq: Four times a day (QID) | ORAL | Status: DC | PRN

## 2022-04-25 MED ORDER — METOCLOPRAMIDE HCL 5 MG PO TABS: 5.0000 mg | ORAL_TABLET | Freq: Three times a day (TID) | ORAL | Status: DC | PRN

## 2022-04-25 MED ORDER — CEFAZOLIN SODIUM-DEXTROSE 2-4 GM/100ML-% IV SOLN
INTRAVENOUS | Status: AC
  Filled 2022-04-25: qty 100

## 2022-04-25 MED ORDER — DOCUSATE SODIUM 100 MG PO CAPS
100.0000 mg | ORAL_CAPSULE | Freq: Two times a day (BID) | ORAL | Status: DC
  Administered 2022-04-25 – 2022-04-29 (×5): 100 mg via ORAL
  Filled 2022-04-25 (×10): qty 1

## 2022-04-25 MED ORDER — PHENYLEPHRINE HCL (PRESSORS) 10 MG/ML IV SOLN
INTRAVENOUS | Status: DC | PRN
  Administered 2022-04-25 (×2): 160 ug via INTRAVENOUS
  Administered 2022-04-25: 80 ug via INTRAVENOUS
  Administered 2022-04-25: 160 ug via INTRAVENOUS
  Administered 2022-04-25: 80 ug via INTRAVENOUS

## 2022-04-25 MED ORDER — LIDOCAINE HCL (PF) 2 % IJ SOLN
INTRAMUSCULAR | Status: AC
  Filled 2022-04-25: qty 5

## 2022-04-25 MED ORDER — FENTANYL CITRATE PF 50 MCG/ML IJ SOSY
25.0000 ug | PREFILLED_SYRINGE | INTRAMUSCULAR | Status: DC | PRN
  Administered 2022-04-25: 50 ug via INTRAVENOUS

## 2022-04-25 MED ORDER — LACTATED RINGERS IV SOLN: INTRAVENOUS | Status: DC | PRN

## 2022-04-25 MED ORDER — PHENOL 1.4 % MT LIQD: 1.0000 | OROMUCOSAL | Status: DC | PRN

## 2022-04-25 SURGICAL SUPPLY — 39 items
BAG COUNTER SPONGE SURGICOUNT (BAG) IMPLANT
BAG DECANTER FOR FLEXI CONT (MISCELLANEOUS) IMPLANT
BAG ZIPLOCK 12X15 (MISCELLANEOUS) IMPLANT
BLADE SAG 18X100X1.27 (BLADE) ×1 IMPLANT
COVER PERINEAL POST (MISCELLANEOUS) ×1 IMPLANT
COVER SURGICAL LIGHT HANDLE (MISCELLANEOUS) ×1 IMPLANT
CUP ACET PINNACLE SECTR 50MM (Hips) IMPLANT
DERMABOND ADVANCED .7 DNX12 (GAUZE/BANDAGES/DRESSINGS) ×1 IMPLANT
DRAPE FOOT SWITCH (DRAPES) ×1 IMPLANT
DRAPE STERI IOBAN 125X83 (DRAPES) ×1 IMPLANT
DRAPE U-SHAPE 47X51 STRL (DRAPES) ×2 IMPLANT
DRESSING AQUACEL AG SP 3.5X10 (GAUZE/BANDAGES/DRESSINGS) ×1 IMPLANT
DRSG AQUACEL AG SP 3.5X10 (GAUZE/BANDAGES/DRESSINGS) ×1
DURAPREP 26ML APPLICATOR (WOUND CARE) ×1 IMPLANT
ELECT REM PT RETURN 15FT ADLT (MISCELLANEOUS) ×1 IMPLANT
GLOVE BIO SURGEON STRL SZ 6 (GLOVE) ×1 IMPLANT
GLOVE BIOGEL PI IND STRL 6.5 (GLOVE) ×1 IMPLANT
GLOVE BIOGEL PI IND STRL 7.5 (GLOVE) ×1 IMPLANT
GLOVE ORTHO TXT STRL SZ7.5 (GLOVE) ×2 IMPLANT
GOWN STRL REUS W/ TWL LRG LVL3 (GOWN DISPOSABLE) ×2 IMPLANT
GOWN STRL REUS W/TWL LRG LVL3 (GOWN DISPOSABLE) ×2
HEAD FEM STD 32X+5 STRL (Hips) IMPLANT
HOLDER FOLEY CATH W/STRAP (MISCELLANEOUS) ×1 IMPLANT
KIT TURNOVER KIT A (KITS) IMPLANT
LINER ACET PNNCL PLUS4 NEUTRAL (Hips) IMPLANT
PACK ANTERIOR HIP CUSTOM (KITS) ×1 IMPLANT
PINNACLE PLUS 4 NEUTRAL (Hips) ×1 IMPLANT
PINNACLE SECTOR CUP 50MM (Hips) ×1 IMPLANT
SCREW 6.5MMX30MM (Screw) IMPLANT
STEM FEMORAL SZ5 HIGH ACTIS (Stem) IMPLANT
SUT MNCRL AB 4-0 PS2 18 (SUTURE) ×1 IMPLANT
SUT STRATAFIX 0 PDS 27 VIOLET (SUTURE) ×1
SUT VIC AB 1 CT1 36 (SUTURE) ×3 IMPLANT
SUT VIC AB 2-0 CT1 27 (SUTURE) ×2
SUT VIC AB 2-0 CT1 TAPERPNT 27 (SUTURE) ×2 IMPLANT
SUTURE STRATFX 0 PDS 27 VIOLET (SUTURE) ×1 IMPLANT
TRAY FOLEY MTR SLVR 16FR STAT (SET/KITS/TRAYS/PACK) IMPLANT
TUBE SUCTION HIGH CAP CLEAR NV (SUCTIONS) ×1 IMPLANT
WATER STERILE IRR 1000ML POUR (IV SOLUTION) ×1 IMPLANT

## 2022-04-25 NOTE — Anesthesia Procedure Notes (Signed)
Procedure Name: Intubation Date/Time: 04/25/2022 7:53 AM  Performed by: [place] Manus Rudd, CRNAPre-anesthesia Checklist: Patient identified, Emergency Drugs available, Suction available and Patient being monitored Patient Re-evaluated:Patient Re-evaluated prior to induction Oxygen Delivery Method: Circle system utilized Preoxygenation: Pre-oxygenation with 100% oxygen Induction Type: IV induction Ventilation: Mask ventilation without difficulty Laryngoscope Size: Mac and 3 Grade View: Grade I Tube type: Oral Tube size: 7.0 mm Number of attempts: 1 Airway Equipment and Method: Stylet and Oral airway Placement Confirmation: ETT inserted through vocal cords under direct vision, positive ETCO2 and breath sounds checked- equal and bilateral Secured at: 20 cm Tube secured with: Tape Dental Injury: Teeth and Oropharynx as per pre-operative assessment

## 2022-04-25 NOTE — Op Note (Signed)
NAME:  Kara Davis NO.: [id number]      MEDICAL RECORD NO.: [id number]      FACILITY:  [facility]      PHYSICIAN:  Mauri Pole  DATE OF BIRTH:  18-Jun-1934     DATE OF PROCEDURE:  04/25/2022                                 OPERATIVE REPORT         PREOPERATIVE DIAGNOSIS: Right  hip osteoarthritis.      POSTOPERATIVE DIAGNOSIS:  Right hip osteoarthritis.      PROCEDURE:  Right total hip replacement through an anterior approach   utilizing DePuy THR system, component size 50 mm pinnacle cup, a size 32+4 neutral   Altrex liner, a size 5 Hi Actis stem with a 32+5 Articuleze metal head ball.      SURGEON:  Pietro Cassis. Alvan Dame, M.D.      ASSISTANT:  Costella Hatcher, PA-C     ANESTHESIA:  General.      SPECIMENS:  None.      COMPLICATIONS:  None.      BLOOD LOSS:  250 cc     DRAINS:  None.      INDICATION OF THE PROCEDURE:  Kara Davis is a 88 y.o. female who had an unwitnessed ground level fall presented to the ER via EMS with inability to bear weight.  Radiogrpahs revealed a displaced femoral neck fracture.  Treatment recommendations were reviewed. Risks and benefits discussed with patient and her daughter.  Consent was obtained for management of her hip fracture and pain relief.   PROCEDURE IN DETAIL:  The patient was brought to operative theater.   Once adequate anesthesia, preoperative antibiotics, 2 gm of Ancef, 1 gm of Tranexamic Acid, and 10 mg of Decadron were administered, the patient was positioned supine on the [employer] table.  Once the patient was safely positioned with adequate padding of boney prominences we predraped out the hip, and used fluoroscopy to confirm orientation of the pelvis.      The right hip was then prepped and draped from proximal iliac crest to   mid thigh with a shower curtain technique.      Time-out was performed identifying the patient, planned procedure, and the appropriate extremity.     An  incision was then made 2 cm lateral to the   anterior superior iliac spine extending over the orientation of the   tensor fascia lata muscle and sharp dissection was carried down to the   fascia of the muscle.      The fascia was then incised.  The muscle belly was identified and swept   laterally and retractor placed along the superior neck.  Following   cauterization of the circumflex vessels and removing some pericapsular   fat, a second cobra retractor was placed on the inferior neck.  A T-capsulotomy was made along the line of the   superior neck to the trochanteric fossa, then extended proximally and   distally.  Tag sutures were placed and the retractors were then placed   intracapsular.  We then identified the trochanteric fossa and   orientation of my neck cut and then made a neck osteotomy with the femur on traction.  The fractured femoral neck segment and the femoral  head were removed without difficulty or complication.  Traction was let   off and retractors were placed posterior and anterior around the   acetabulum.      The labrum and foveal tissue were debrided.  I began reaming with a 45 mm   reamer and reamed up to 49 mm reamer with good bony bed preparation and a 40 mm  cup was chosen.  The final 50 mm Pinnacle cup was then impacted under fluoroscopy to confirm the depth of penetration and orientation with respect to   Abduction and forward flexion.  A screw was placed into the ilium followed by the hole eliminator.  The final   32+4 neutral Altrex liner was impacted with good visualized rim fit.  The cup was positioned anatomically within the acetabular portion of the pelvis.      At this point, the femur was rolled to 100 degrees.  Further capsule was   released off the inferior aspect of the femoral neck.  I then   released the superior capsule proximally.  With the leg in a neutral position the hook was placed laterally   along the femur under the vastus lateralis  origin and elevated manually and then held in position using the hook attachment on the bed.  The leg was then extended and adducted with the leg rolled to 100   degrees of external rotation.  Retractors were placed along the medial calcar and posteriorly over the greater trochanter.  Once the proximal femur was fully   exposed, I used a box osteotome to set orientation.  I then began   broaching with the starting chili pepper broach and passed this by hand and then broached up to 5.  With the 5 broach in place I chose a high offset neck and did several trial reductions.  The offset was appropriate, leg lengths   appeared to be equal best matched with the +5 head ball trial confirmed radiographically.   Given these findings, I went ahead and dislocated the hip, repositioned all   retractors and positioned the right hip in the extended and abducted position.  The final 5 Hi Actis stem was   chosen and it was impacted down to the level of neck cut.  Based on this   and the trial reductions, a final 32+5 Articuleze metal head ball was chosen and   impacted onto a clean and dry trunnion, and the hip was reduced.  The   hip had been irrigated throughout the case again at this point.  I did   reapproximate the superior capsular leaflet to the anterior leaflet   using #1 Vicryl.  The fascia of the   tensor fascia lata muscle was then reapproximated using #1 Vicryl and #0 Stratafix sutures.  The   remaining wound was closed with 2-0 Vicryl and running 4-0 Monocryl.   The hip was cleaned, dried, and dressed sterilely using Dermabond and   Aquacel dressing.  The patient was then brought   to recovery room in stable condition tolerating the procedure well.    Costella Hatcher, PA-C was present for the entirety of the case involved from   preoperative positioning, perioperative retractor management, general   facilitation of the case, as well as primary wound closure as assistant.            Pietro Cassis  Alvan Dame, M.D.        04/25/2022 7:34 AM

## 2022-04-25 NOTE — Discharge Instructions (Signed)

## 2022-04-25 NOTE — Plan of Care (Signed)
  Problem: Education: ?Goal: Knowledge of General Education information will improve ?Description: Including pain rating scale, medication(s)/side effects and non-pharmacologic comfort measures ?Outcome: Progressing ?  ?Problem: Activity: ?Goal: Risk for activity intolerance will decrease ?Outcome: Progressing ?  ?Problem: Coping: ?Goal: Level of anxiety will decrease ?Outcome: Progressing ?  ?Problem: Pain Managment: ?Goal: General experience of comfort will improve ?Outcome: Progressing ?  ?Problem: Safety: ?Goal: Ability to remain free from injury will improve ?Outcome: Progressing ?  ?

## 2022-04-25 NOTE — Assessment & Plan Note (Addendum)
Sodium stable 127.   - Continue fluid restriction - Stop SSRI at discharge

## 2022-04-25 NOTE — Transfer of Care (Signed)
Immediate Anesthesia Transfer of Care Note  Patient: Kara Davis  Procedure(s) Performed: TOTAL HIP ARTHROPLASTY ANTERIOR APPROACH (Right: Hip)  Patient Location: PACU  Anesthesia Type:General  Level of Consciousness: awake, alert , and oriented  Airway & Oxygen Therapy: Patient Spontanous Breathing and Patient connected to face mask oxygen  Post-op Assessment: Report given to RN and Post -op Vital signs reviewed and stable  Post vital signs: Reviewed and stable  Last Vitals:  Vitals Value Taken Time  BP 187/87 04/25/22 0913  Temp    Pulse 92 04/25/22 0914  Resp 15 04/25/22 0914  SpO2 98 % 04/25/22 0914  Vitals shown include unvalidated device data.  Last Pain:  Vitals:   04/25/22 0622  TempSrc:   PainSc: Asleep         Complications: No notable events documented.

## 2022-04-25 NOTE — Anesthesia Postprocedure Evaluation (Signed)
Anesthesia Post Note  Patient: Kara Davis  Procedure(s) Performed: TOTAL HIP ARTHROPLASTY ANTERIOR APPROACH (Right: Hip)     Patient location during evaluation: PACU Anesthesia Type: General Level of consciousness: sedated and patient cooperative Pain management: pain level controlled Vital Signs Assessment: post-procedure vital signs reviewed and stable Respiratory status: spontaneous breathing Cardiovascular status: stable Anesthetic complications: no   No notable events documented.  Last Vitals:  Vitals:   04/25/22 1310 04/25/22 1420  BP: (!) 121/59 (!) 115/58  Pulse: 78 81  Resp: 16 16  Temp: 37.6 C 36.9 C  SpO2: 100% 100%    Last Pain:  Vitals:   04/25/22 1036  TempSrc:   PainSc: 0-No pain                 Nolon Nations

## 2022-04-25 NOTE — Assessment & Plan Note (Signed)
S/p anterior total right hip replacement on 1/21 -Consult orthopedics, appreciate cares - Analgesics - PT - WBAT - ASA 81 BID for DVT PPx

## 2022-04-25 NOTE — Plan of Care (Signed)
  Problem: Coping: Goal: Level of anxiety will decrease Outcome: Progressing   Problem: Pain Managment: Goal: General experience of comfort will improve Outcome: Progressing

## 2022-04-25 NOTE — Progress Notes (Signed)
Progress Note   Patient: Kara Davis VVZ:[id number] DOB: 1934-11-29 DOA: 04/23/2022     2 DOS: the patient was seen and examined on 04/25/2022   Brief hospital course: Taken from H&P.  WHITNIE DELEON is a 88 y.o. female with medical history significant of HTN, HLD, GERD, hypothyroidism, anxiety with depression, dementia who presented to ED after a fall. Her daughter left at 12pm and her caregiver got there at 3pm. Sometime between then she states she was walking in the hall and hit a slick spot and slipped and fell. She was in the dining room on the ground when her caregiver got there. She got her off the floor and into a chair.  She then called EMS.   ER Course:  vitals: afebrile, bp: 170/72, HR: 105, RR: 20, oxygen: 95% RA Pertinent labs: wbc: 17.1, sodium: 130,  CXR: emphysema with vascular congestion. No acute finding.  Hip xray: displaced  right impacted subcapital femoral neck fracture Knee xray: chondrocalcinosis. Orthopedic surgery was consulted.  1/20: Hemodynamically stable.  Labs with sodium of 132, leukocytosis resolved. Going for surgery tomorrow morning.  1/21: Hemodynamically stable.  S/p total right hip replacement through an anterior approach by orthopedic surgery this morning.   Assessment and Plan: * Closed right hip fracture ([place]) 88 year old presenting with unwitnessed fall and found to have displaced right impacted subcapital femoral neck fracture. S/p total right hip replacement through anterior approach this morning. -Continue with pain management -PT/OT evaluation as recommended by orthopedic surgery.  SIRS (systemic inflammatory response syndrome) (HCC) Initially met SIRS criteria, but no evidence of infection; however, UA pending Vitals normalized Leukocytosis likely reactive and has been resolved.  Hyponatremia Daughter states long history of hyponatremia, currently seems stable around her baseline.  Can be secondary to Zoloft. -Continue to  monitor  Hypothyroidism TSH pending Continue home synthroid   Essential hypertension On no medication  Blood pressure to goal for age Continue to monitor   Anxiety with depression Has not had her xanax, but this helps a lot Start back xanax .'25mg'$  Bid prn -Holding Zoloft as it can be contributory to hyponatremia  Memory loss Delirium precautions    Subjective: Patient was seen and examined after right total hip replacement surgery today.  Having some discomfort at surgical site.  No other specific complaints.  Daughter at bedside.  Physical Exam: Vitals:   04/25/22 0930 04/25/22 0945 04/25/22 1000 04/25/22 1033  BP: 134/73 (!) 143/67 (!) 115/53 (!) 114/58  Pulse: 78 82 77 77  Resp: '15 13 10 12  '$ Temp:      TempSrc:      SpO2: 99% 100% 100% 100%  Weight:      Height:       General.  Frail elderly lady, in no acute distress. Pulmonary.  Lungs clear bilaterally, normal respiratory effort. CV.  Regular rate and rhythm, no JVD, rub or murmur. Abdomen.  Soft, nontender, nondistended, BS positive. CNS.  Alert and oriented .  No focal neurologic deficit. Extremities.  No edema, no cyanosis, pulses intact and symmetrical. Psychiatry.  Judgment and insight appears normal.   Data Reviewed: Prior data reviewed  Family Communication: Discussed with daughter at bedside  Disposition: Status is: Inpatient Remains inpatient appropriate because: Severity of illness  Planned Discharge Destination:  To be determined  DVT prophylaxis.  SCDs Time spent: 44 minutes  This record has been created using Systems analyst. Errors have been sought and corrected,but may not always be located. Such  creation errors do not reflect on the standard of care.   Author: Lorella Nimrod, MD 04/25/2022 11:44 AM  For on call review [email or web address].

## 2022-04-25 NOTE — Anesthesia Preprocedure Evaluation (Addendum)
Anesthesia Evaluation  Patient identified by MRN, date of birth, ID band Patient awake    Reviewed: Allergy & Precautions, NPO status , Patient's Chart, lab work & pertinent test results  Airway Mallampati: II  TM Distance: >3 FB Neck ROM: Full    Dental  (+) Poor Dentition, Chipped, Missing, Dental Advisory Given   Pulmonary former smoker   Pulmonary exam normal breath sounds clear to auscultation       Cardiovascular hypertension,  Rhythm:Regular Rate:Normal + Systolic murmurs Echo 04/7492  1. Left ventricular ejection fraction, by estimation, is 60 to 65%. The left ventricle has normal function. The left ventricle has no regional wall motion abnormalities. Left ventricular diastolic parameters are consistent with Grade I diastolic dysfunction (impaired relaxation).   2. Right ventricular systolic function is normal. The right ventricular size is normal. Tricuspid regurgitation signal is inadequate for assessing PA pressure.   3. A small pericardial effusion is present. The pericardial effusion is anterior to the right ventricle.   4. The mitral valve is normal in structure. No evidence of mitral valve regurgitation. No evidence of mitral stenosis.   5. The aortic valve was not well visualized. Aortic valve regurgitation is trivial. No aortic stenosis is present.   Comparison(s): Prior images unable to be directly viewed, comparison made by report only. No significant change from prior study.     Neuro/Psych  PSYCHIATRIC DISORDERS Anxiety Depression    negative neurological ROS     GI/Hepatic Neg liver ROS,GERD  ,,  Endo/Other  Hypothyroidism    Renal/GU negative Renal ROS     Musculoskeletal negative musculoskeletal ROS (+)    Abdominal   Peds  Hematology  (+) Blood dyscrasia, anemia   Anesthesia Other Findings   Reproductive/Obstetrics                              Anesthesia  Physical Anesthesia Plan  ASA: 3  Anesthesia Plan: General   Post-op Pain Management: Ofirmev IV (intra-op)*   Induction: Intravenous  PONV Risk Score and Plan: 4 or greater and Ondansetron, Dexamethasone and Treatment may vary due to age or medical condition  Airway Management Planned: Oral ETT and Video Laryngoscope Planned  Additional Equipment:   Intra-op Plan:   Post-operative Plan: Extubation in OR  Informed Consent: I have reviewed the patients History and Physical, chart, labs and discussed the procedure including the risks, benefits and alternatives for the proposed anesthesia with the patient or authorized representative who has indicated his/her understanding and acceptance.   Patient has DNR.  Discussed DNR with patient and Continue DNR.   Dental advisory given  Plan Discussed with: CRNA  Anesthesia Plan Comments: (Had detailed discussion regarding DNR. Pt requests no resuscitation.)         Anesthesia Quick Evaluation

## 2022-04-25 NOTE — Interval H&P Note (Signed)
History and Physical Interval Note:  04/25/2022 7:19 AM  Kara Davis  has presented today for surgery, with the diagnosis of RIGHT HIP FRACTURE.  The various methods of treatment have been discussed with the patient and family. After consideration of risks, benefits and other options for treatment, the patient has consented to  Procedure(s): TOTAL HIP ARTHROPLASTY ANTERIOR APPROACH (Right) as a surgical intervention.  The patient's history has been reviewed, patient examined, no change in status, stable for surgery.  I have reviewed the patient's chart and labs.  Questions were answered to the patient's satisfaction.     Mauri Pole

## 2022-04-25 NOTE — Assessment & Plan Note (Signed)
Initially met SIRS criteria, but no evidence of infection; however, UA pending Vitals normalized Leukocytosis likely reactive and has been resolved.

## 2022-04-25 NOTE — Progress Notes (Signed)
Patient ID: Kara Davis, female   DOB: [birth year], 88 y.o.   MRN: [id number]  Subjective: Day of Surgery Procedure(s) (LRB): TOTAL HIP ARTHROPLASTY ANTERIOR APPROACH (Right)    Patient reports pain as moderate.  Objective:   VITALS:   Vitals:   04/25/22 0700 04/25/22 0703  BP: 130/66 125/61  Pulse: 89 88  Resp: 10 15  Temp: 98.3 F (36.8 C)   SpO2: 92% 92%    Neurovascular intact  LABS Recent Labs    04/23/22 1858 04/24/22 0546  HGB 13.7 13.8  HCT 41.2 42.0  WBC 17.1* 10.2  PLT 340 354    Recent Labs    04/23/22 1858 04/24/22 0546  NA 130* 132*  K 3.6 4.2  BUN 14 13  CREATININE 0.85 0.83  GLUCOSE 164* 129*    Recent Labs    04/23/22 1858  INR 1.0     Assessment/Plan: Day of Surgery Procedure(s) (LRB): TOTAL HIP ARTHROPLASTY ANTERIOR APPROACH (Right)   Plan: To OR today for right total hip replacement for management of her right hip femoral neck fracture NPO

## 2022-04-26 ENCOUNTER — Encounter (HOSPITAL_COMMUNITY): Payer: Self-pay | Admitting: Orthopedic Surgery

## 2022-04-26 DIAGNOSIS — D649 Anemia, unspecified: Secondary | ICD-10-CM | POA: Insufficient documentation

## 2022-04-26 DIAGNOSIS — S72001A Fracture of unspecified part of neck of right femur, initial encounter for closed fracture: Secondary | ICD-10-CM | POA: Diagnosis not present

## 2022-04-26 DIAGNOSIS — D62 Acute posthemorrhagic anemia: Secondary | ICD-10-CM

## 2022-04-26 LAB — IRON AND TIBC
Iron: 37 ug/dL (ref 28–170)
Saturation Ratios: 15 % (ref 10.4–31.8)
TIBC: 249 ug/dL — ABNORMAL LOW (ref 250–450)
UIBC: 212 ug/dL

## 2022-04-26 LAB — FERRITIN: Ferritin: 99 ng/mL (ref 11–307)

## 2022-04-26 LAB — BASIC METABOLIC PANEL
Anion gap: 6 (ref 5–15)
BUN: 15 mg/dL (ref 8–23)
CO2: 26 mmol/L (ref 22–32)
Calcium: 8.2 mg/dL — ABNORMAL LOW (ref 8.9–10.3)
Chloride: 96 mmol/L — ABNORMAL LOW (ref 98–111)
Creatinine, Ser: 0.82 mg/dL (ref 0.44–1.00)
GFR, Estimated: >60 mL/min (ref >60)
Glucose, Bld: 113 mg/dL — ABNORMAL HIGH (ref 70–99)
Potassium: 4.6 mmol/L (ref 3.5–5.1)
Sodium: 128 mmol/L — ABNORMAL LOW (ref 135–145)

## 2022-04-26 LAB — CBC
HCT: 30.4 % — ABNORMAL LOW (ref 36.0–46.0)
Hemoglobin: 10.2 g/dL — ABNORMAL LOW (ref 12.0–15.0)
MCH: 28.3 pg (ref 26.0–34.0)
MCHC: 33.6 g/dL (ref 30.0–36.0)
MCV: 84.2 fL (ref 80.0–100.0)
Platelets: 240 10*3/uL (ref 150–400)
RBC: 3.61 MIL/uL — ABNORMAL LOW (ref 3.87–5.11)
RDW: 13.2 % (ref 11.5–15.5)
WBC: 11.9 10*3/uL — ABNORMAL HIGH (ref 4.0–10.5)
nRBC: 0 % (ref 0.0–0.2)

## 2022-04-26 LAB — VITAMIN B12: Vitamin B-12: 601 pg/mL (ref 180–914)

## 2022-04-26 LAB — RETICULOCYTES
Immature Retic Fract: 6.9 % (ref 2.3–15.9)
RBC.: 3.57 MIL/uL — ABNORMAL LOW (ref 3.87–5.11)
Retic Count, Absolute: 41.4 10*3/uL (ref 19.0–186.0)
Retic Ct Pct: 1.2 % (ref 0.4–3.1)

## 2022-04-26 LAB — FOLATE: Folate: 7.3 ng/mL (ref >5.9)

## 2022-04-26 MED ORDER — FE FUM-VIT C-VIT B12-FA 460-60-0.01-1 MG PO CAPS
1.0000 | ORAL_CAPSULE | Freq: Every day | ORAL | Status: DC
  Administered 2022-04-26 – 2022-04-30 (×5): 1 via ORAL
  Filled 2022-04-26 (×5): qty 1

## 2022-04-26 NOTE — Care Management Important Message (Signed)
Important Message  Patient Details IM Letter given. Name: Kara Davis MRN: [id number] Date of Birth: [birth year]   Medicare Important Message Given:  Yes     Kerin Salen 04/26/2022, 3:56 PM

## 2022-04-26 NOTE — Evaluation (Signed)
Occupational Therapy Evaluation Patient Details Name: Kara Davis MRN: [id number] DOB: 1935/04/01 Today's Date: 04/26/2022   History of Present Illness Patient is a 88 year old female who presented on 1/20 after an unwitnessed fall found by caregiver.  Patient noted to have displaced right impacted subcapital femoral neck fracture. Patient underwent a right total hip replacement anterior approach. PMH: dementia, anxiety, depression, memory loss, bilateral cataracts, IBS,   Clinical Impression   Patient is a 88 year old female who was admitted for above. Patient was living at home with caregiver support for around 6 hours total daily. Patient was noted to require increased A for all tasks on this date with increased agitation with standing noted. Patient's family are unable to offer more assistance at home at this time. Patient was noted to have decreased functional activity tolerance, decreased endurance, decreased standing balance, decreased safety awareness, and decreased knowledge of AD/AE impacting participation in ADLs. Patient would continue to benefit from skilled OT services at this time while admitted and after d/c to address noted deficits in order to improve overall safety and independence in ADLs.       Recommendations for follow up therapy are one component of a multi-disciplinary discharge planning process, led by the attending physician.  Recommendations may be updated based on patient status, additional functional criteria and insurance authorization.   Follow Up Recommendations  Skilled nursing-short term rehab (<3 hours/day)     Assistance Recommended at Discharge Frequent or constant Supervision/Assistance  Patient can return home with the following Two people to help with walking and/or transfers;Two people to help with bathing/dressing/bathroom;Direct supervision/assist for financial management;Help with stairs or ramp for entrance;Assist for transportation;Direct  supervision/assist for medications management    Functional Status Assessment  Patient has had a recent decline in their functional status and demonstrates the ability to make significant improvements in function in a reasonable and predictable amount of time.  Equipment Recommendations  Other (comment) (defer to next venue)    Recommendations for Other Services       Precautions / Restrictions Precautions Precautions: Fall Precaution Comments: easily agitated Restrictions Weight Bearing Restrictions: No RLE Weight Bearing: Weight bearing as tolerated      Mobility Bed Mobility Overal bed mobility: Needs Assistance Bed Mobility: Sit to Supine       Sit to supine: Min assist   General bed mobility comments: patient reported that she wanted to do it herself with patietn able to scoot back into bed and get BLE mostly onto bed with min A to complete last portion of turn into bed. patient needed increased time to position BLE and trunk in midline in bed.        Balance Overall balance assessment: Needs assistance Sitting-balance support: No upper extremity supported, Feet supported Sitting balance-Leahy Scale: Fair       Standing balance-Leahy Scale: Zero         ADL either performed or assessed with clinical judgement   ADL Overall ADL's : Needs assistance/impaired Eating/Feeding: Set up;Sitting   Grooming: Set up;Bed level   Upper Body Bathing: Bed level;Moderate assistance   Lower Body Bathing: Bed level;Total assistance   Upper Body Dressing : Bed level;Moderate assistance   Lower Body Dressing: Bed level;Total assistance   Toilet Transfer: +2 for physical assistance;+2 for safety/equipment Toilet Transfer Details (indicate cue type and reason): patient was +2 HHA to transfer from recliner back into bed at this time. patient upon inital steps started yelling reporting i cant do this and  attempted to sit down. patient encouraged to remain standing and  physical A to help with weight shifting to turn to EOB to get out of recliner. Toileting- Clothing Manipulation and Hygiene: Total assistance;Sit to/from stand       Functional mobility during ADLs: +2 for safety/equipment;+2 for physical assistance General ADL Comments: patient was easily agitated.     Vision Baseline Vision/History: 1 Wears glasses              Pertinent Vitals/Pain Pain Assessment Pain Assessment: Faces Faces Pain Scale: Hurts even more Pain Location: L hip Pain Descriptors / Indicators: Grimacing, Guarding Pain Intervention(s): Limited activity within patient's tolerance, Premedicated before session, Monitored during session, Repositioned, Ice applied     Hand Dominance Right   Extremity/Trunk Assessment Upper Extremity Assessment Upper Extremity Assessment: Overall WFL for tasks assessed (noted to have edema and redness in LUE. nurse reporting old IV site)   Lower Extremity Assessment Lower Extremity Assessment: Defer to PT evaluation RLE Deficits / Details: resistant to imposed movment RLE: Unable to fully assess due to pain LLE Deficits / Details: resistant to imposed movment LLE: Unable to fully assess due to pain   Cervical / Trunk Assessment Cervical / Trunk Assessment: Normal   Communication Communication Communication: No difficulties   Cognition Arousal/Alertness: Awake/alert Behavior During Therapy: Agitated Overall Cognitive Status: History of cognitive impairments - at baseline Area of Impairment: Following commands, Problem solving, Memory, Safety/judgement         Memory: Decreased short-term memory Following Commands: Follows one step commands inconsistently, Follows multi-step commands inconsistently Safety/Judgement: Decreased awareness of safety, Decreased awareness of deficits   Problem Solving: Difficulty sequencing, Requires verbal cues, Requires tactile cues General Comments: patient is easily distracted and easily  agitated. made inappropirate comments about tech during session                [place] expects to be discharged to:: [place]: Children;Other (Comment) (caregiver for 3 hours in AM and 3 hours in PM)   Type of Home: House Home Access: Stairs to enter [employer] of Steps: 3 Entrance Stairs-Rails: Right;Left;Can reach both Home Layout: Two level;1/2 bath on main level;Able to live on main level with bedroom/bathroom   Alternate Level Stairs-Rails: Right           Home Equipment: Rollator (4 wheels);Hand held shower head;Shower seat          Prior Functioning/Environment Prior Level of Function : Independent/Modified Independent             Mobility Comments: no device, independent per pt dtr ADLs Comments: Pt's daughter comes by every day or every other day and does pt's laundry as well as heavier house cleaning. Pt does drive, manages her own medications.  Per daughter, pt is stubbornly independent and resistant to leaving her townhome. Daughter has asked pt to move in with her but pt has declined. patient was independent in toileting, refused to wash up at times, and dressing at home PLOF.        OT Problem List: Decreased strength;Decreased activity tolerance;Impaired balance (sitting and/or standing);Decreased coordination;Decreased safety awareness;Decreased knowledge of precautions;Decreased knowledge of use of DME or AE;Pain      OT Treatment/Interventions: Self-care/ADL training;Energy conservation;Therapeutic exercise;DME and/or AE instruction;Therapeutic activities;Patient/family education;Balance training    OT Goals(Current goals can be found in the care plan section) Acute Rehab OT Goals Patient Stated Goal: to get back in bed OT Goal Formulation: With patient/family Time For Goal  Achievement: 05/10/22 Potential to Achieve Goals: Fair  OT Frequency: Min 2X/week       AM-PAC OT "6  Clicks" Daily Activity     Outcome Measure Help from another person eating meals?: A Little Help from another person taking care of personal grooming?: A Little Help from another person toileting, which includes using toliet, bedpan, or urinal?: Total Help from another person bathing (including washing, rinsing, drying)?: Total Help from another person to put on and taking off regular upper body clothing?: A Lot Help from another person to put on and taking off regular lower body clothing?: Total 6 Click Score: 11   End of Session Equipment Utilized During Treatment: Gait belt Nurse Communication: Mobility status (family request for pure wic back)  Activity Tolerance: Patient limited by pain Patient left: in bed;with call bell/phone within reach;with bed alarm set;with family/visitor present  OT Visit Diagnosis: Unsteadiness on feet (R26.81);Other abnormalities of gait and mobility (R26.89);Muscle weakness (generalized) (M62.81)                Time: 9191-6606 OT Time Calculation (min): 17 min Charges:  OT General Charges $OT Visit: 1 Visit OT Evaluation $OT Eval Moderate Complexity: 1 Mod  Arlisa Leclere OTR/L, MS Acute Rehabilitation Department Office# [phone]   Willa Rough 04/26/2022, 12:16 PM

## 2022-04-26 NOTE — Plan of Care (Signed)
  Problem: Education: Goal: Knowledge of General Education information will improve Description: Including pain rating scale, medication(s)/side effects and non-pharmacologic comfort measures Outcome: Progressing   Problem: Pain Managment: Goal: General experience of comfort will improve Outcome: Progressing   Problem: Safety: Goal: Ability to remain free from injury will improve Outcome: Progressing

## 2022-04-26 NOTE — NC FL2 (Signed)
[place] LEVEL OF CARE FORM     IDENTIFICATION  Patient Name: Kara Davis Birthdate: 10-14-1934 Sex: female Admission Date (Current Location): 04/23/2022  [facility] and [place] Number:  Herbalist and Address:  [facility],  [place], [place]      Provider Number: [id number]  Attending Physician Name and Address:  Lorella Nimrod, MD  Relative Name and Phone Number:  Dennard Schaumann (daughter) Ph: [phone]    Current Level of Care: Hospital Recommended Level of Care: [place] Prior Approval Number:    Date Approved/Denied:   PASRR Number: Pending  Discharge Plan: SNF    Current Diagnoses: Patient Active Problem List   Diagnosis Date Noted   Acute postoperative anemia due to expected blood loss 04/26/2022   S/P total right hip arthroplasty 04/25/2022   Closed right hip fracture ([place]) 04/23/2022   Memory loss 04/23/2022   SIRS (systemic inflammatory response syndrome) ([place]) 04/23/2022   Caregiver stress 01/03/2022   Palliative care encounter 05/30/2021   Cognitive impairment 05/30/2021   Slurred speech 03/09/2021   Conductive hearing loss of both ears 09/17/2015   Dysfunction of both eustachian tubes 09/17/2015   Belching 04/20/2013   Nausea alone 11/02/2012   Constipation 11/02/2012   Dysuria 11/02/2012   Pyuria 03/20/2012   Knee pain 02/25/2012   Disturbance of skin sensation 02/25/2012   Aneurysm ([place]) 10/01/2011   Hyponatremia 03/19/2011   Anemia, unspecified 03/19/2011   Aneurysm, 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/15/2011   UTI (lower urinary tract infection) 03/15/2011   Hypothyroidism 03/13/2011   Abdominal pain, other specified site 10/12/2010   Routine general medical examination at a health care facility 08/19/2010   Encounter for long-term (current) use of other medications 08/19/2010   Gross hematuria 08/19/2010   Numbness 08/19/2010   Unspecified disorder of liver 08/19/2010   UTI  03/31/2010   CHILLS WITHOUT FEVER 03/31/2010   Abdominal pain 08/18/2009   VITAMIN D DEFICIENCY 05/23/2008   Palpitations 05/23/2008   Essential hypertension 07/27/2007   Anxiety with depression 12/06/2006   DYSPEPSIA 12/06/2006   Diverticulosis of colon 12/06/2006   Irritable bowel syndrome 12/06/2006   ABDOMINAL PAIN, CHRONIC 12/06/2006   HYPERGLYCEMIA 12/06/2006    Orientation RESPIRATION BLADDER Height & Weight     Self, Situation, Place  Normal Incontinent Weight: 116 lb 13.5 oz (53 kg) Height:  '5\' 3"'$  (160 cm)  BEHAVIORAL SYMPTOMS/MOOD NEUROLOGICAL BOWEL NUTRITION STATUS      Continent Diet (Regular diet)  AMBULATORY STATUS COMMUNICATION OF NEEDS Skin   Extensive Assist Verbally Surgical wounds, Other (Comment) (Ecchymosis: bilateral arms)                       Personal Care Assistance Level of Assistance  Bathing, Feeding, Dressing Bathing Assistance: Maximum assistance Feeding assistance: Limited assistance Dressing Assistance: Maximum assistance     Functional Limitations Info  Sight, Hearing, Speech Sight Info: Adequate Hearing Info: Adequate Speech Info: Adequate    SPECIAL CARE FACTORS FREQUENCY  PT (By licensed PT), OT (By licensed OT)     PT Frequency: 5x's/week OT Frequency: 5x's/week            Contractures Contractures Info: Not present    Additional Factors Info  Code Status, Allergies, Psychotropic Code Status Info: DNR Allergies Info: NKA Psychotropic Info: Xanax         Current Medications (04/26/2022):  This is the current hospital active medication list Current Facility-Administered Medications  Medication  Dose Route Frequency Provider Last Rate Last Admin   acetaminophen (TYLENOL) tablet 325-650 mg  325-650 mg Oral Q6H PRN Irving Copas, PA-C   650 mg at 04/26/22 4010   ALPRAZolam Duanne Moron) tablet 0.25 mg  0.25 mg Oral BID PRN Irving Copas, PA-C   0.25 mg at 04/26/22 2725   aspirin chewable tablet 81 mg  81 mg Oral BID  Irving Copas, PA-C   81 mg at 04/26/22 3664   bisacodyl (DULCOLAX) suppository 10 mg  10 mg Rectal Daily PRN Irving Copas, PA-C       diphenhydrAMINE (BENADRYL) 12.5 MG/5ML elixir 12.5-25 mg  12.5-25 mg Oral Q4H PRN Costella Hatcher R, PA-C       docusate sodium (COLACE) capsule 100 mg  100 mg Oral BID Irving Copas, PA-C   100 mg at 04/25/22 2224   Fe Fum-Vit C-Vit B12-FA (TRIGELS-F FORTE) capsule 1 capsule  1 capsule Oral QPC breakfast Lorella Nimrod, MD   1 capsule at 04/26/22 0841   HYDROcodone-acetaminophen (NORCO/VICODIN) 5-325 MG per tablet 1-2 tablet  1-2 tablet Oral Q6H PRN Irving Copas, PA-C   2 tablet at 04/26/22 1031   levothyroxine (SYNTHROID) tablet 75 mcg  75 mcg Oral QAC breakfast Irving Copas, PA-C   75 mcg at 04/26/22 0430   menthol-cetylpyridinium (CEPACOL) lozenge 3 mg  1 lozenge Oral PRN Irving Copas, PA-C       Or   phenol (CHLORASEPTIC) mouth spray 1 spray  1 spray Mouth/Throat PRN Irving Copas, PA-C       methocarbamol (ROBAXIN) tablet 500 mg  500 mg Oral Q6H PRN Costella Hatcher R, PA-C   500 mg at 04/26/22 0430   Or   methocarbamol (ROBAXIN) 500 mg in dextrose 5 % 50 mL IVPB  500 mg Intravenous Q6H PRN Irving Copas, PA-C 110 mL/hr at 04/25/22 0933 500 mg at 04/25/22 0933   metoCLOPramide (REGLAN) tablet 5-10 mg  5-10 mg Oral Q8H PRN Irving Copas, PA-C       Or   metoCLOPramide (REGLAN) injection 5-10 mg  5-10 mg Intravenous Q8H PRN Irving Copas, PA-C       morphine (PF) 2 MG/ML injection 0.5-1 mg  0.5-1 mg Intravenous Q2H PRN Irving Copas, PA-C       ondansetron [facility]) tablet 4 mg  4 mg Oral Q6H PRN Irving Copas, PA-C       Or   ondansetron [facility]) injection 4 mg  4 mg Intravenous Q6H PRN Costella Hatcher R, PA-C       polyethylene glycol (MIRALAX / GLYCOLAX) packet 17 g  17 g Oral BID Irving Copas, PA-C   17 g at 04/25/22 2224     Discharge Medications: Please see discharge summary for a list of  discharge medications.  Relevant Imaging Results:  Relevant Lab Results:   Additional Information SSN: [id number]  Sherie Don, LCSW

## 2022-04-26 NOTE — Evaluation (Signed)
Physical Therapy Evaluation Patient Details Name: Kara Davis MRN: [id number] DOB: October 26, 1934 Today's Date: 04/26/2022  History of Present Illness  88 y.o. female with  who presented to ED after a fall resulting in R hip fx. [name] s/p R THA, AA on 07/25/22. PMH: HTN, HLD, GERD, hypothyroidism, anxiety with depression, dementia, L opthalmic artery aneurysm  Clinical Impression  Pt admitted with above diagnosis.   Pt requiring +2 assist for bed mobility and tranfers. Discussed with dtr and family in agreement with plan for SNF.  Will follow and attempt to progress as able in acute setting Pt currently with functional limitations due to the deficits listed below (see PT Problem List). Pt will benefit from skilled PT to increase their independence and safety with mobility to allow discharge to the venue listed below.          Recommendations for follow up therapy are one component of a multi-disciplinary discharge planning process, led by the attending physician.  Recommendations may be updated based on patient status, additional functional criteria and insurance authorization.  Follow Up Recommendations Skilled nursing-short term rehab (<3 hours/day) Can patient physically be transported by private vehicle: No    Assistance Recommended at Discharge Frequent or constant Supervision/Assistance  Patient can return home with the following  A lot of help with walking and/or transfers;A lot of help with bathing/dressing/bathroom;Assist for transportation;Help with stairs or ramp for entrance;Assistance with cooking/housework;Direct supervision/assist for financial management;Direct supervision/assist for medications management    Equipment Recommendations None recommended by PT  Recommendations for Other Services       Functional Status Assessment Patient has had a recent decline in their functional status and demonstrates the ability to make significant improvements in function in a reasonable  and predictable amount of time.     Precautions / Restrictions Precautions Precautions: Fall Precaution Comments: easily agitated Restrictions Weight Bearing Restrictions: No RLE Weight Bearing: Weight bearing as tolerated      Mobility  Bed Mobility Overal bed mobility: Needs Assistance Bed Mobility: Supine to Sit     Supine to sit: Max assist, +2 for physical assistance, +2 for safety/equipment     General bed mobility comments: assist to progress bil LEs off bed and elevate trunk; bed soaked in urine    Transfers Overall transfer level: Needs assistance Equipment used: 2 person hand held assist Transfers: Bed to chair/wheelchair/BSC       Squat pivot transfers: Mod assist, Max assist, +2 physical assistance, +2 safety/equipment     General transfer comment: with multi-modal cues and incr time pt able to assist with squat pivot bed to chair, able to tolerate some wt on LEs and self assist with bil HHA  (refused use of walker)    Ambulation/Gait                  Stairs            Wheelchair Mobility    Modified Rankin (Stroke Patients Only)       Balance Overall balance assessment: Needs assistance Sitting-balance support: No upper extremity supported, Feet supported Sitting balance-Leahy Scale: Fair       Standing balance-Leahy Scale: Zero                               Pertinent Vitals/Pain Pain Assessment Pain Assessment: Faces Faces Pain Scale: Hurts even more Pain Location: L hip Pain Descriptors / Indicators: Grimacing, Guarding Pain Intervention(s): Limited  activity within patient's tolerance, Monitored during session, Premedicated before session, Repositioned, Ice applied    Home Living Family/patient expects to be discharged to:: Skilled nursing facility Living Arrangements: Children   Type of Home: House Home Access: Stairs to enter Entrance Stairs-Rails: Right;Left;Can reach both Entrance Stairs-Number of  Steps: 3   Home Layout: Two level;1/2 bath on main level;Able to live on main level with bedroom/bathroom Home Equipment: Rollator (4 wheels);Hand held shower head;Shower seat      Prior Function Prior Level of Function : Independent/Modified Independent             Mobility Comments: no device, independent per pt dtr       Hand Dominance        Extremity/Trunk Assessment   Upper Extremity Assessment Upper Extremity Assessment: Defer to OT evaluation    Lower Extremity Assessment Lower Extremity Assessment: Generalized weakness;RLE deficits/detail;Difficult to assess due to impaired cognition;LLE deficits/detail RLE Deficits / Details: resistant to imposed movment RLE: Unable to fully assess due to pain LLE Deficits / Details: resistant to imposed movment LLE: Unable to fully assess due to pain       Communication   Communication: No difficulties  Cognition Arousal/Alertness: Awake/alert   Overall Cognitive Status: History of cognitive impairments - at baseline Area of Impairment: Following commands, Problem solving, Memory, Safety/judgement                     Memory: Decreased short-term memory Following Commands: Follows one step commands inconsistently, Follows multi-step commands inconsistently Safety/Judgement: Decreased awareness of safety, Decreased awareness of deficits   Problem Solving: Difficulty sequencing, Requires verbal cues, Requires tactile cues General Comments: pt is easily agitated, verylittle insight into current deficits; pt briefly combative attempting to hit PT and tech; able to calm pt with incr time, and having  family leave room        General Comments      Exercises     Assessment/Plan    PT Assessment Patient needs continued PT services  PT Problem List Decreased strength;Decreased range of motion;Decreased activity tolerance;Decreased mobility;Decreased balance;Decreased coordination;Decreased cognition;Pain        PT Treatment Interventions DME instruction;Gait training;Functional mobility training;Therapeutic activities;Patient/family education;Therapeutic exercise;Balance training    PT Goals (Current goals can be found in the Care Plan section)  Acute Rehab PT Goals PT Goal Formulation: Patient unable to participate in goal setting Time For Goal Achievement: 05/10/22 Potential to Achieve Goals: Fair    Frequency Min 3X/week     Co-evaluation               AM-PAC PT "6 Clicks" Mobility  Outcome Measure Help needed turning from your back to your side while in a flat bed without using bedrails?: Total Help needed moving from lying on your back to sitting on the side of a flat bed without using bedrails?: Total Help needed moving to and from a bed to a chair (including a wheelchair)?: Total Help needed standing up from a chair using your arms (e.g., wheelchair or bedside chair)?: Total Help needed to walk in hospital room?: Total Help needed climbing 3-5 steps with a railing? : Total 6 Click Score: 6    End of Session Equipment Utilized During Treatment:  (refused gait belt) Activity Tolerance: Other (comment);Patient limited by pain (ltd by cognition) Patient left: in chair;with call bell/phone within reach;with chair alarm set Nurse Communication: Mobility status PT Visit Diagnosis: Other abnormalities of gait and mobility (R26.89);Difficulty in walking, not elsewhere  classified (R26.2)    Time: 3779-3968 PT Time Calculation (min) (ACUTE ONLY): 17 min   Charges:   PT Evaluation $PT Eval Low Complexity: [place], PT  Acute Rehab Dept [facility]) [phone]  [facility] Weekend Pager [facility] only)  [phone]  04/26/2022   [facility] 04/26/2022, 10:18 AM

## 2022-04-26 NOTE — TOC PASRR Note (Signed)
TOC Dementia Note  Patient Details  Name: Kara Davis Date of Birth: 02/12/1935 04/26/2022, 11:56 AM  To Whom It May Concern:  Please be advised that the above-named patient has a primary diagnosis of dementia which supersedes any psychiatric diagnosis.  Transition of Care (TOC) CM/SW Contact: Sherie Don, LCSW Phone Number: 04/26/2022, 11:56 AM

## 2022-04-26 NOTE — Progress Notes (Signed)
Patient ID: Kara Davis, female   DOB: May 23, 1934, 88 y.o.   MRN: [id number] Subjective: 1 Day Post-Op Procedure(s) (LRB): TOTAL HIP ARTHROPLASTY ANTERIOR APPROACH (Right)    Patient reports pain as moderate. Baseline mild dementia - lives independently with daily support A bit anxious about pain and getting up  Objective:   VITALS:   Vitals:   04/26/22 0203 04/26/22 0551  BP: (!) 106/45 (!) 118/46  Pulse: 73 66  Resp: 17 16  Temp: 98.1 F (36.7 C) 97.8 F (36.6 C)  SpO2: 93% 94%    Neurovascular intact Incision: dressing C/D/I  LABS Recent Labs    04/23/22 1858 04/24/22 0546 04/26/22 0343  HGB 13.7 13.8 10.2*  HCT 41.2 42.0 30.4*  WBC 17.1* 10.2 11.9*  PLT 340 354 240    Recent Labs    04/23/22 1858 04/24/22 0546 04/26/22 0343  NA 130* 132* 128*  K 3.6 4.2 4.6  BUN '14 13 15  '$ CREATININE 0.85 0.83 0.82  GLUCOSE 164* 129* 113*    Recent Labs    04/23/22 1858  INR 1.0     Assessment/Plan: 1 Day Post-Op Procedure(s) (LRB): TOTAL HIP ARTHROPLASTY ANTERIOR APPROACH (Right)   Up with therapy Norco for pain ASA for DVT prophylaxis Disposition pending therapy assessment and recommendations as well as discussion with her daughter

## 2022-04-26 NOTE — Plan of Care (Signed)
  Problem: Activity: Goal: Risk for activity intolerance will decrease Outcome: Progressing   Problem: Safety: Goal: Ability to remain free from injury will improve Outcome: Progressing   Problem: Pain Management: Goal: Pain level will decrease with appropriate interventions Outcome: Progressing

## 2022-04-26 NOTE — Assessment & Plan Note (Signed)
Normocytic. Postoperative anemia ruled out. This is expected blood loss, requires no further treatment, iron studies normal.

## 2022-04-26 NOTE — TOC Initial Note (Signed)
Transition of Care [facility]) - Initial/Assessment Note   Patient Details  Name: Kara Davis MRN: [id number] Date of Birth: 1935-02-16  [facility]) CM/SW Contact:    Sherie Don, LCSW Phone Number: 04/26/2022, 11:40 AM  Clinical Narrative: PT evaluation recommended SNF. CSW followed up with patient's daughter regarding recommendations. Daughter is agreeable to SNF and requested [facility] and [facility] as first choices as these are in-network with the patient's insurance.  FL2 done; PASRR pending. Clinicals uploaded to [facility] MUST for review. Patient received bed offer from [facility], but was declined by [facility]. Daughter accepted bed offer. CSW confirmed bed with [place] in admissions at [facility]. Facility to start insurance authorization. TOC awaiting PASRR number and insurance authorization.  Expected Discharge Plan: Skilled Nursing Facility Barriers to Discharge: Continued Medical Work up  Patient Goals and CMS Choice Patient states their goals for this hospitalization and ongoing recovery are:: Go to rehab before returning home CMS Medicare.gov Compare Post Acute Care list provided to:: Patient Represenative (must comment) Choice offered to / list presented to : Adult Children  Expected Discharge Plan and Services In-house Referral: Clinical Social Work Post Acute Care Choice: [place] Living arrangements for the past 2 months: Single Family Home           DME Arranged: N/A DME Agency: NA  Prior Living Arrangements/Services Living arrangements for the past 2 months: Single Family Home Patient language and need for interpreter reviewed:: Yes Do you feel safe going back to the place where you live?: Yes      Need for Family Participation in Patient Care: Yes (Comment) (Patient is oriented to self only.) Care giver support system in place?: Yes (comment) Criminal Activity/Legal Involvement Pertinent to Current Situation/Hospitalization: No - Comment  as needed  Activities of Daily Living Home Assistive Devices/Equipment: None ADL Screening (condition at time of admission) Patient's cognitive ability adequate to safely complete daily activities?: Yes Is the patient deaf or have difficulty hearing?: Yes Does the patient have difficulty seeing, even when wearing glasses/contacts?: Yes Does the patient have difficulty concentrating, remembering, or making decisions?: No Patient able to express need for assistance with ADLs?: Yes Does the patient have difficulty dressing or bathing?: No Independently performs ADLs?: Yes (appropriate for developmental age) Does the patient have difficulty walking or climbing stairs?: No Weakness of Legs: None Weakness of Arms/Hands: None  Permission Sought/Granted Permission sought to share information with : Chartered certified accountant granted to share information with : Yes, Verbal Permission Granted Permission granted to share info w AGENCY: SNFs  Emotional Assessment Orientation: : Oriented to Self Alcohol / Substance Use: Not Applicable Psych Involvement: No (comment)  Admission diagnosis:  Closed right hip fracture ([place]) [S72.001A] Closed fracture of neck of right femur, initial encounter ([place]) [S72.001A] Fall, initial encounter [W19.XXXA] Patient Active Problem List   Diagnosis Date Noted   Acute postoperative anemia due to expected blood loss 04/26/2022   S/P total right hip arthroplasty 04/25/2022   Closed right hip fracture ([place]) 04/23/2022   Memory loss 04/23/2022   SIRS (systemic inflammatory response syndrome) ([place]) 04/23/2022   Caregiver stress 01/03/2022   Palliative care encounter 05/30/2021   Cognitive impairment 05/30/2021   Slurred speech 03/09/2021   Conductive hearing loss of both ears 09/17/2015   Dysfunction of both eustachian tubes 09/17/2015   Belching 04/20/2013   Nausea alone 11/02/2012   Constipation 11/02/2012   Dysuria 11/02/2012   Pyuria  03/20/2012   Knee pain 02/25/2012   Disturbance  of skin sensation 02/25/2012   Aneurysm ([place]) 10/01/2011   Hyponatremia 03/19/2011   Anemia, unspecified 03/19/2011   Aneurysm, 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/15/2011   UTI (lower urinary tract infection) 03/15/2011   Hypothyroidism 03/13/2011   Abdominal pain, other specified site 10/12/2010   Routine general medical examination at a health care facility 08/19/2010   Encounter for long-term (current) use of other medications 08/19/2010   Gross hematuria 08/19/2010   Numbness 08/19/2010   Unspecified disorder of liver 08/19/2010   UTI 03/31/2010   CHILLS WITHOUT FEVER 03/31/2010   Abdominal pain 08/18/2009   VITAMIN D DEFICIENCY 05/23/2008   Palpitations 05/23/2008   Essential hypertension 07/27/2007   Anxiety with depression 12/06/2006   DYSPEPSIA 12/06/2006   Diverticulosis of colon 12/06/2006   Irritable bowel syndrome 12/06/2006   ABDOMINAL PAIN, CHRONIC 12/06/2006   HYPERGLYCEMIA 12/06/2006   PCP:  Ginger Organ., MD Pharmacy:   [id number] [place] [id number]: [phone]: [phone] [id number]: [phone]: [phone] Social Determinants of Health (SDOH) Social History: SDOH Screenings   Tobacco Use: Medium Risk (04/25/2022)   SDOH Interventions:    Readmission Risk Interventions     No data to display

## 2022-04-26 NOTE — Progress Notes (Signed)
Progress Note   Patient: Kara Davis KGU:[id number] DOB: Jun 25, 1934 DOA: 04/23/2022     3 DOS: the patient was seen and examined on 04/26/2022   Brief hospital course: Taken from H&P.  VICKE PLOTNER is a 88 y.o. female with medical history significant of HTN, HLD, GERD, hypothyroidism, anxiety with depression, dementia who presented to ED after a fall. Her daughter left at 12pm and her caregiver got there at 3pm. Sometime between then she states she was walking in the hall and hit a slick spot and slipped and fell. She was in the dining room on the ground when her caregiver got there. She got her off the floor and into a chair.  She then called EMS.   ER Course:  vitals: afebrile, bp: 170/72, HR: 105, RR: 20, oxygen: 95% RA Pertinent labs: wbc: 17.1, sodium: 130,  CXR: emphysema with vascular congestion. No acute finding.  Hip xray: displaced  right impacted subcapital femoral neck fracture Knee xray: chondrocalcinosis. Orthopedic surgery was consulted.  1/20: Hemodynamically stable.  Labs with sodium of 132, leukocytosis resolved. Going for surgery tomorrow morning.  1/21: Hemodynamically stable.  S/p total right hip replacement through an anterior approach by orthopedic surgery this morning.  1/22: Vital stable.  Hemoglobin decreased to 10.2 from 13.8 postsurgically.  Starting on iron supplement.  Sodium also decreased to 128, patient was on normal saline postsurgically which was discontinued today, will monitor sodium. PT and OT are recommending SNF   Assessment and Plan: * Closed right hip fracture ([place]) 88 year old presenting with unwitnessed fall and found to have displaced right impacted subcapital femoral neck fracture. S/p total right hip replacement through anterior approach this morning. -Continue with pain management -PT/OT evaluation as recommended by orthopedic surgery.  Acute postoperative anemia due to expected blood loss Hemoglobin decreased to 10.2 from 13.8  postsurgically due to loss during surgery. Check anemia panel -Start her on iron supplement  SIRS (systemic inflammatory response syndrome) (HCC) Initially met SIRS criteria, but no evidence of infection; however, UA pending Vitals normalized Leukocytosis likely reactive and has been resolved.  Hyponatremia Sodium decreased to 128 this morning.  Patient was on normal saline since yesterday, started by surgery which was discontinued today.  Most likely some element of SIADH. Daughter states long history of hyponatremia,   Can be secondary to Zoloft. -Continue to monitor -Encourage salt on diet before trying salt tablets  Hypothyroidism TSH pending Continue home synthroid   Essential hypertension On no medication  Blood pressure to goal for age Continue to monitor   Anxiety with depression Has not had her xanax, but this helps a lot Start back xanax .'25mg'$  Bid prn -Holding Zoloft as it can be contributory to hyponatremia  Memory loss Delirium precautions    Subjective: Patient was sitting in chair comfortably when seen today.  Pain seems well-controlled.  Feeling little anxious with mobility.  Physical Exam: Vitals:   04/25/22 1718 04/25/22 2212 04/26/22 0203 04/26/22 0551  BP: 109/64 122/64 (!) 106/45 (!) 118/46  Pulse: 77 81 73 66  Resp: '16 17 17 16  '$ Temp: 97.9 F (36.6 C) 98.5 F (36.9 C) 98.1 F (36.7 C) 97.8 F (36.6 C)  TempSrc:  Oral Oral   SpO2: 100% 94% 93% 94%  Weight:      Height:       General.  Frail and malnourished elderly lady, in no acute distress. Pulmonary.  Lungs clear bilaterally, normal respiratory effort. CV.  Regular rate and rhythm, no  JVD, rub or murmur. Abdomen.  Soft, nontender, nondistended, BS positive. CNS.  Alert and oriented .  No focal neurologic deficit. Extremities.  No edema, no cyanosis, pulses intact and symmetrical. Psychiatry.  Judgment and insight appears normal.   Data Reviewed: Prior data reviewed  Family  Communication: Discussed with daughter and son at bedside  Disposition: Status is: Inpatient Remains inpatient appropriate because: Severity of illness  Planned Discharge Destination: SNF DVT prophylaxis.  SCDs Time spent: 40 minutes  This record has been created using Systems analyst. Errors have been sought and corrected,but may not always be located. Such creation errors do not reflect on the standard of care.   Author: Lorella Nimrod, MD 04/26/2022 10:19 AM  For on call review [email or web address].

## 2022-04-27 DIAGNOSIS — S72001A Fracture of unspecified part of neck of right femur, initial encounter for closed fracture: Secondary | ICD-10-CM | POA: Diagnosis not present

## 2022-04-27 LAB — MISC LABCORP TEST (SEND OUT): Labcorp test code: 81950

## 2022-04-27 LAB — CBC
HCT: 28.8 % — ABNORMAL LOW (ref 36.0–46.0)
Hemoglobin: 9.6 g/dL — ABNORMAL LOW (ref 12.0–15.0)
MCH: 27.9 pg (ref 26.0–34.0)
MCHC: 33.3 g/dL (ref 30.0–36.0)
MCV: 83.7 fL (ref 80.0–100.0)
Platelets: 230 10*3/uL (ref 150–400)
RBC: 3.44 MIL/uL — ABNORMAL LOW (ref 3.87–5.11)
RDW: 13.4 % (ref 11.5–15.5)
WBC: 7.9 10*3/uL (ref 4.0–10.5)
nRBC: 0 % (ref 0.0–0.2)

## 2022-04-27 LAB — BASIC METABOLIC PANEL
Anion gap: 3 — ABNORMAL LOW (ref 5–15)
BUN: 18 mg/dL (ref 8–23)
CO2: 28 mmol/L (ref 22–32)
Calcium: 7.8 mg/dL — ABNORMAL LOW (ref 8.9–10.3)
Chloride: 92 mmol/L — ABNORMAL LOW (ref 98–111)
Creatinine, Ser: 0.73 mg/dL (ref 0.44–1.00)
GFR, Estimated: >60 mL/min (ref >60)
Glucose, Bld: 90 mg/dL (ref 70–99)
Potassium: 5 mmol/L (ref 3.5–5.1)
Sodium: 123 mmol/L — ABNORMAL LOW (ref 135–145)

## 2022-04-27 MED ORDER — HYDROCODONE-ACETAMINOPHEN 5-325 MG PO TABS: 1.0000 | ORAL_TABLET | Freq: Four times a day (QID) | ORAL | 0 refills | Status: DC | PRN

## 2022-04-27 MED ORDER — ALPRAZOLAM 0.25 MG PO TABS
0.2500 mg | ORAL_TABLET | Freq: Every evening | ORAL | Status: DC | PRN
  Administered 2022-04-27 – 2022-04-29 (×3): 0.25 mg via ORAL
  Filled 2022-04-27 (×3): qty 1

## 2022-04-27 MED ORDER — ASPIRIN 81 MG PO CHEW: 81.0000 mg | CHEWABLE_TABLET | Freq: Two times a day (BID) | ORAL | 0 refills | Status: AC

## 2022-04-27 MED ORDER — SODIUM CHLORIDE 0.9 % IV SOLN: INTRAVENOUS | Status: DC

## 2022-04-27 MED ORDER — METHOCARBAMOL 500 MG PO TABS: 500.0000 mg | ORAL_TABLET | Freq: Three times a day (TID) | ORAL | 0 refills | Status: AC | PRN

## 2022-04-27 NOTE — TOC Progression Note (Signed)
Transition of Care [facility]) - Progression Note   Patient Details  Name: Kara Davis MRN: [id number] Date of Birth: 1935/01/25  Transition of Care [facility]) CM/SW [place], LCSW Phone Number: 04/27/2022, 11:44 AM  Clinical Narrative: Patient is not yet medically ready to discharge to SNF. CSW updated [place] at [place]. Insurance authorization and PASRR number are still pending.  Expected Discharge Plan: [place] Barriers to Discharge: Continued Medical Work up  Expected Discharge Plan and Services In-house Referral: Clinical Social Work Post Acute Care Choice: [place] Living arrangements for the past 2 months: Single Family Home           DME Arranged: N/A DME Agency: NA  Social Determinants of Health (SDOH) Interventions SDOH Screenings   Tobacco Use: Medium Risk (04/26/2022)   Readmission Risk Interventions     No data to display

## 2022-04-27 NOTE — Progress Notes (Signed)
OT Cancellation Note  Patient Details Name: Kara Davis MRN: [id number] DOB: 09-Jun-1934   Cancelled Treatment:    Reason Eval/Treat Not Completed: Fatigue/lethargy limiting ability to participate Patient is sleeping in bed at this time. OT to continue to follow and check back as schedule will allow Rennie Plowman, MS Acute Rehabilitation Department Office# [phone] 04/27/2022, 10:43 AM

## 2022-04-27 NOTE — Plan of Care (Signed)

## 2022-04-27 NOTE — Progress Notes (Signed)
Patient ID: Kara Davis, female   DOB: [birth year], 88 y.o.   MRN: [id number] Subjective: 2 Days Post-Op Procedure(s) (LRB): TOTAL HIP ARTHROPLASTY ANTERIOR APPROACH (Right)    Patient reports pain as moderate. Pleasantly confused this am Expresses anxiety  Objective:   VITALS:   Vitals:   04/26/22 2117 04/27/22 0537  BP: (!) 105/56 (!) 141/60  Pulse: 77 97  Resp: 16 16  Temp: 98.5 F (36.9 C) 98.2 F (36.8 C)  SpO2: 93% 97%    Neurovascular intact Incision: dressing C/D/I - right hip  LABS Recent Labs    04/26/22 0343 04/27/22 0354  HGB 10.2* 9.6*  HCT 30.4* 28.8*  WBC 11.9* 7.9  PLT 240 230    Recent Labs    04/26/22 0343 04/27/22 0354  NA 128* 123*  K 4.6 5.0  BUN 15 18  CREATININE 0.82 0.73  GLUCOSE 113* 90    No results for input(s): "LABPT", "INR" in the last 72 hours.   Assessment/Plan: 2 Days Post-Op Procedure(s) (LRB): TOTAL HIP ARTHROPLASTY ANTERIOR APPROACH (Right)   Up with therapy WBAT RTC in 2 weeks Rx printed and on chart

## 2022-04-27 NOTE — Progress Notes (Signed)
  Progress Note   Patient: Kara Davis [id number] DOB: May 16, 1934 DOA: 04/23/2022     4 DOS: the patient was seen and examined on 04/27/2022 at 8:55AM      Brief hospital course: Kara Davis is an 88 year old F with dementia, lives at home, HTN, hypothyroidism who presented after a fall unable to stand  In the ER, radiographs showed displaced subcapital right femoral neck fracture.  For total right hip replacement by Dr. Alvan Dame on 1/21  Post-op course complicated by hyponatremia      Assessment and Plan: * Closed right hip fracture ([place]) -Consult orthopedics, appreciate cares - Analgesics - PT    Hyponatremia - Avoid IV fluids - Fluid restriction - Hold SSRI - Check urine chemistries   Anemia Normocytic.  Postoperative anemia ruled out.  This is expected blood loss, requires no further treatment, iron studies normal.    Hypothyroidism - Continue levothyroxine  Essential hypertension BP normal  Anxiety with depression - Continue low dose home Xanax - Hold Zoloft    Memory loss - Delirium precautions           Subjective: Patient is doing well, pain is well-controlled, no fever, no respiratory symptoms     Physical Exam: BP (!) 141/60 (BP Location: Left Arm)   Pulse 97   Temp 98.2 F (36.8 C) (Oral)   Resp 16   Ht '5\' 3"'$  (1.6 m)   Wt 53 kg   SpO2 97%   BMI 20.70 kg/m   Elderly adult female, lying in bed, no acute distress, eating breakfast RRR, no murmurs, no peripheral edema Respiratory normal, lungs clear without rales or wheezes Abdomen soft no tenderness palpation Makes eye contact, responds to questions, face symmetric, upper extremity strength normal, lower extremity strength testing not done   Data Reviewed: Patient metabolic panel shows sodium down to 123 Hemoglobin down to 9.6 Iron studies normal  Family Communication: Daughter at the bedside    Disposition: Status is: Inpatient Patient was admitted for hip  fracture, she had repair  Postoperative course now complicated by hyponatremia.  hopefully by tomorrow sodium will start to improve and she can discharge by Thursday        Author: Edwin Dada, MD 04/27/2022 1:31 PM  For on call review [email or web address].

## 2022-04-28 DIAGNOSIS — S72001A Fracture of unspecified part of neck of right femur, initial encounter for closed fracture: Secondary | ICD-10-CM | POA: Diagnosis not present

## 2022-04-28 LAB — BASIC METABOLIC PANEL
Anion gap: 7 (ref 5–15)
BUN: 17 mg/dL (ref 8–23)
CO2: 26 mmol/L (ref 22–32)
Calcium: 8.2 mg/dL — ABNORMAL LOW (ref 8.9–10.3)
Chloride: 97 mmol/L — ABNORMAL LOW (ref 98–111)
Creatinine, Ser: 0.68 mg/dL (ref 0.44–1.00)
GFR, Estimated: >60 mL/min (ref >60)
Glucose, Bld: 96 mg/dL (ref 70–99)
Potassium: 5 mmol/L (ref 3.5–5.1)
Sodium: 130 mmol/L — ABNORMAL LOW (ref 135–145)

## 2022-04-28 LAB — CBC
HCT: 29.5 % — ABNORMAL LOW (ref 36.0–46.0)
Hemoglobin: 9.6 g/dL — ABNORMAL LOW (ref 12.0–15.0)
MCH: 27.4 pg (ref 26.0–34.0)
MCHC: 32.5 g/dL (ref 30.0–36.0)
MCV: 84.3 fL (ref 80.0–100.0)
Platelets: 255 10*3/uL (ref 150–400)
RBC: 3.5 MIL/uL — ABNORMAL LOW (ref 3.87–5.11)
RDW: 13.5 % (ref 11.5–15.5)
WBC: 6.7 10*3/uL (ref 4.0–10.5)
nRBC: 0 % (ref 0.0–0.2)

## 2022-04-28 NOTE — TOC Progression Note (Signed)
Transition of Care [facility]) - Progression Note   Patient Details  Name: Kara Davis MRN: [id number] Date of Birth: 1934/08/09  Transition of Care [facility]) CM/SW [place], LCSW Phone Number: 04/28/2022, 2:58 PM  Clinical Narrative: CSW notified by [place] with [facility] that the facility has insurance authorization. PASRR is still pending. CSW called [facility] regarding the PASRR number needing to be expedited as patient is ready to be discharged pending receiving the number. CSW updated daughter regarding the delay.    Expected Discharge Plan: [place] Barriers to Discharge: [place] [facility])  Expected Discharge Plan and Services In-house Referral: Clinical Social Work Post Acute Care Choice: [place] Living arrangements for the past 2 months: Single Family Home           DME Arranged: N/A DME Agency: NA  Social Determinants of Health (SDOH) Interventions SDOH Screenings   Tobacco Use: Medium Risk (04/26/2022)   Readmission Risk Interventions     No data to display

## 2022-04-28 NOTE — Progress Notes (Signed)
Occupational Therapy Treatment Patient Details Name: Kara Davis MRN: [id number] DOB: [birth year] Today's Date: 04/28/2022   History of present illness Patient is a 88 year old female who presented on 1/20 after an unwitnessed fall found by caregiver.  Patient noted to have displaced right impacted subcapital femoral neck fracture. Patient underwent a right total hip replacement anterior approach. PMH: dementia, anxiety, depression, memory loss, bilateral cataracts, IBS,   OT comments  Patient was noted to have improves towards goals since eval. Patient was able to engage in using RW to transfer from bed to bathroom in room with increased time and cues with min A. Patient remains reluctant to participate in hygiene tasks especially when water is involved. Patient would continue to benefit from skilled OT services at this time while admitted and after d/c to address noted deficits in order to improve overall safety and independence in ADLs.     Recommendations for follow up therapy are one component of a multi-disciplinary discharge planning process, led by the attending physician.  Recommendations may be updated based on patient status, additional functional criteria and insurance authorization.    Follow Up Recommendations  Skilled nursing-short term rehab (<3 hours/day)     Assistance Recommended at Discharge Frequent or constant Supervision/Assistance  Patient can return home with the following  Direct supervision/assist for financial management;Help with stairs or ramp for entrance;Assist for transportation;Direct supervision/assist for medications management;A lot of help with bathing/dressing/bathroom;A little help with walking and/or transfers   Equipment Recommendations  Other (comment) (defer to next venue)    Recommendations for Other Services      Precautions / Restrictions Precautions Precautions: Fall Precaution Comments: easily agitated Restrictions Weight Bearing  Restrictions: No RLE Weight Bearing: Weight bearing as tolerated       Mobility Bed Mobility Overal bed mobility: Needs Assistance Bed Mobility: Sit to Supine     Supine to sit: Mod assist     General bed mobility comments: with use of leg lifter on LLE with consistent cues and encouragement to participate          ADL either performed or assessed with clinical judgement   ADL Overall ADL's : Needs assistance/impaired       Grooming Details (indicate cue type and reason): patient declined to brush teeth reporting she did it already today. patients son reported she had not, even with encouragement from son and therapist patient declined to participate in oral hygiene.   Upper Body Bathing Details (indicate cue type and reason): patient decliend   Lower Body Bathing Details (indicate cue type and reason): patient was TD with increased eduaction and ecouragement with patient noted to have incontinence with walking to the bathroom of urine. patient was educated on importance of washing to prevent infection. patient reluctanlty agreed for legs and feet to get clean with water.         Toilet Transfer: Minimal assistance;Ambulation;Rolling walker (2 wheels) Toilet Transfer Details (indicate cue type and reason): with increased time and cues Toileting- Clothing Manipulation and Hygiene: Min guard;Sitting/lateral lean                Cognition Arousal/Alertness: Awake/alert Behavior During Therapy: Agitated Overall Cognitive Status: History of cognitive impairments - at baseline     General Comments: patient was slightly less agitated and was able to follow commands slightly better than previous session                   Pertinent Vitals/ Pain  Pain Assessment Pain Assessment: Faces Faces Pain Scale: Hurts even more Pain Location: L hip Pain Descriptors / Indicators: Grimacing, Guarding Pain Intervention(s): Limited activity within patient's tolerance,  Premedicated before session, Monitored during session         Frequency  Min 2X/week        Progress Toward Goals  OT Goals(current goals can now be found in the care plan section)  Progress towards OT goals: Progressing toward goals     Plan Discharge plan remains appropriate       AM-PAC OT "6 Clicks" Daily Activity     Outcome Measure   Help from another person eating meals?: A Little Help from another person taking care of personal grooming?: A Little Help from another person toileting, which includes using toliet, bedpan, or urinal?: A Lot Help from another person bathing (including washing, rinsing, drying)?: A Lot Help from another person to put on and taking off regular upper body clothing?: A Lot Help from another person to put on and taking off regular lower body clothing?: Total 6 Click Score: 13    End of Session Equipment Utilized During Treatment: Gait belt;Rolling walker (2 wheels)  OT Visit Diagnosis: Unsteadiness on feet (R26.81);Other abnormalities of gait and mobility (R26.89);Muscle weakness (generalized) (M62.81)   Activity Tolerance Patient limited by pain   Patient Left in chair;with call bell/phone within reach;with family/visitor present   Nurse Communication Mobility status        Time: 9741-6384 OT Time Calculation (min): 22 min  Charges: OT Treatments $Self Care/Home Management : 8-22 mins  Rennie Plowman, MS Acute Rehabilitation Department Office# [phone]   Willa Rough 04/28/2022, 9:46 AM

## 2022-04-28 NOTE — Plan of Care (Signed)
  Problem: Pain Managment: Goal: General experience of comfort will improve Outcome: Progressing   Problem: Safety: Goal: Ability to remain free from injury will improve Outcome: Progressing

## 2022-04-28 NOTE — Progress Notes (Signed)
  Progress Note   Patient: Kara Davis BOF:[id number] DOB: [birth year] DOA: 04/23/2022     5 DOS: the patient was seen and examined on 04/28/2022 at 8:55AM      Brief hospital course: Kara Davis is an 88 year old F with dementia, lives at home, HTN, hypothyroidism who presented after a fall unable to stand  In the ER, radiographs showed displaced subcapital right femoral neck fracture.  For total right hip replacement by Dr. Alvan Dame on 1/21  Post-op course complicated by hyponatremia      Assessment and Plan: * Closed right hip fracture ([place]) -Consult orthopedics, appreciate cares - Analgesics - PT    Hyponatremia Na up to 130 - Continue fluid restriction - Hold SSRI at d/c   - Hold SSRI   Anemia Normocytic.  Postoperative anemia ruled out.  This is expected blood loss, requires no further treatment, iron studies normal.    Hypothyroidism - Continue levothyroxine  Essential hypertension BP normal  Anxiety with depression - Continue low dose home Xanax - Hold Zoloft    Memory loss - Delirium precautions           Subjective: No confusion, seizures, fever, or change in consciousness.     Physical Exam: BP (!) 102/53 (BP Location: Left Arm)   Pulse 83   Temp 98.6 F (37 C) (Oral)   Resp 16   Ht '5\' 3"'$  (1.6 m)   Wt 53 kg   SpO2 99%   BMI 20.70 kg/m   Elderly adult female, lying in bed, no acute distress, interactive RRR, no murmurs, no peripheral edema Respiratory rate normal, lungs clear without rales or wheezes Responds appropriately to questions, face symmetric, speech fluent, moves upper extremities with normal strength and coordination   Data Reviewed: Basic metabolic panel shows sodium up to 130  Family Communication: Son at the bedside    Disposition: Status is: Inpatient Medically ready for discharge when bed available        Author: Edwin Dada, MD 04/28/2022 1:10 PM  For on call review  [email or web address].

## 2022-04-28 NOTE — Progress Notes (Signed)
Physical Therapy Treatment Patient Details Name: Kara Davis MRN: [id number] DOB: Jun 30, 1934 Today's Date: 04/28/2022   History of Present Illness Patient is a 88 year old female who presented on 1/20 after an unwitnessed fall found by caregiver.  Patient noted to have displaced right impacted subcapital femoral neck fracture. Patient underwent a right total hip replacement anterior approach. PMH: dementia, anxiety, depression, memory loss, bilateral cataracts, IBS,    PT Comments    Pt progressing thi session; amb ~35' with RW, improved cooperation and ability to follow commands. Son present for most of session. Recommend SNF   Recommendations for follow up therapy are one component of a multi-disciplinary discharge planning process, led by the attending physician.  Recommendations may be updated based on patient status, additional functional criteria and insurance authorization.  Follow Up Recommendations  Skilled nursing-short term rehab (<3 hours/day) Can patient physically be transported by private vehicle: No   Assistance Recommended at Discharge Frequent or constant Supervision/Assistance  Patient can return home with the following Assist for transportation;Help with stairs or ramp for entrance;Assistance with cooking/housework;Direct supervision/assist for financial management;Direct supervision/assist for medications management;A little help with walking and/or transfers;A little help with bathing/dressing/bathroom   Equipment Recommendations  None recommended by PT    Recommendations for Other Services       Precautions / Restrictions Precautions Precautions: Fall Precaution Comments: easily agitated Restrictions Weight Bearing Restrictions: No RLE Weight Bearing: Weight bearing as tolerated     Mobility  Bed Mobility Overal bed mobility: Needs Assistance Bed Mobility: Sit to Supine       Sit to supine: Min assist   General bed mobility comments: assist to  progress LEs on to bed    Transfers Overall transfer level: Needs assistance Equipment used: Rolling walker (2 wheels) Transfers: Sit to/from Stand Sit to Stand: Min guard, Min assist           General transfer comment: cues for safety and hand placement. incr time but good pt effort    Ambulation/Gait Ambulation/Gait assistance: Min assist Gait Distance (Feet): 35 Feet Assistive device: Rolling walker (2 wheels) Gait Pattern/deviations: Step-to pattern       General Gait Details: cues for initial sequence, assist to steady and maneuver RW   Stairs             Wheelchair Mobility    Modified Rankin (Stroke Patients Only)       Balance   Sitting-balance support: No upper extremity supported, Feet supported Sitting balance-Leahy Scale: Fair       Standing balance-Leahy Scale: Poor                              Cognition Arousal/Alertness: Awake/alert Behavior During Therapy: Agitated Overall Cognitive Status: History of cognitive impairments - at baseline Area of Impairment: Following commands, Problem solving, Memory, Safety/judgement                       Following Commands: Follows one step commands inconsistently, Follows multi-step commands inconsistently Safety/Judgement: Decreased awareness of safety, Decreased awareness of deficits   Problem Solving: Difficulty sequencing, Requires verbal cues, Requires tactile cues General Comments: patient was slightly less agitated and was able to follow commands slightly better than previous session        Exercises      General Comments        Pertinent Vitals/Pain Pain Assessment Pain Assessment: Faces Faces Pain Scale: Hurts  little more Pain Location: L hip Pain Descriptors / Indicators: Grimacing, Guarding Pain Intervention(s): Limited activity within patient's tolerance, Monitored during session, Premedicated before session, Repositioned    Home Living                           Prior Function            PT Goals (current goals can now be found in the care plan section) Acute Rehab PT Goals PT Goal Formulation: Patient unable to participate in goal setting Time For Goal Achievement: 05/10/22 Potential to Achieve Goals: Fair Progress towards PT goals: Progressing toward goals    Frequency    Min 3X/week      PT Plan Current plan remains appropriate    Co-evaluation              AM-PAC PT "6 Clicks" Mobility   Outcome Measure  Help needed turning from your back to your side while in a flat bed without using bedrails?: A Little Help needed moving from lying on your back to sitting on the side of a flat bed without using bedrails?: A Little Help needed moving to and from a bed to a chair (including a wheelchair)?: A Little Help needed standing up from a chair using your arms (e.g., wheelchair or bedside chair)?: A Little Help needed to walk in hospital room?: A Little Help needed climbing 3-5 steps with a railing? : A Lot 6 Click Score: 17    End of Session Equipment Utilized During Treatment: Gait belt Activity Tolerance: Patient tolerated treatment well Patient left: in bed;with call bell/phone within reach;with bed alarm set   PT Visit Diagnosis: Other abnormalities of gait and mobility (R26.89);Difficulty in walking, not elsewhere classified (R26.2)     Time: 1115-5208 PT Time Calculation (min) (ACUTE ONLY): 16 min  Charges:  $Gait Training: 8-22 mins                     Baxter Flattery, PT  Acute Rehab Dept [facility]) [phone]  [facility] Weekend Pager [facility] only)  [phone]  04/28/2022    [facility] 04/28/2022, 10:53 AM

## 2022-04-29 DIAGNOSIS — E039 Hypothyroidism, unspecified: Secondary | ICD-10-CM

## 2022-04-29 DIAGNOSIS — I10 Essential (primary) hypertension: Secondary | ICD-10-CM

## 2022-04-29 DIAGNOSIS — E871 Hypo-osmolality and hyponatremia: Secondary | ICD-10-CM

## 2022-04-29 DIAGNOSIS — D649 Anemia, unspecified: Secondary | ICD-10-CM | POA: Diagnosis not present

## 2022-04-29 DIAGNOSIS — S72001A Fracture of unspecified part of neck of right femur, initial encounter for closed fracture: Secondary | ICD-10-CM | POA: Diagnosis not present

## 2022-04-29 LAB — BASIC METABOLIC PANEL
Anion gap: 7 (ref 5–15)
BUN: 12 mg/dL (ref 8–23)
CO2: 26 mmol/L (ref 22–32)
Calcium: 8.1 mg/dL — ABNORMAL LOW (ref 8.9–10.3)
Chloride: 94 mmol/L — ABNORMAL LOW (ref 98–111)
Creatinine, Ser: 0.6 mg/dL (ref 0.44–1.00)
GFR, Estimated: >60 mL/min (ref >60)
Glucose, Bld: 110 mg/dL — ABNORMAL HIGH (ref 70–99)
Potassium: 4.6 mmol/L (ref 3.5–5.1)
Sodium: 127 mmol/L — ABNORMAL LOW (ref 135–145)

## 2022-04-29 LAB — CBC
HCT: 30.4 % — ABNORMAL LOW (ref 36.0–46.0)
Hemoglobin: 9.9 g/dL — ABNORMAL LOW (ref 12.0–15.0)
MCH: 27.6 pg (ref 26.0–34.0)
MCHC: 32.6 g/dL (ref 30.0–36.0)
MCV: 84.7 fL (ref 80.0–100.0)
Platelets: 308 10*3/uL (ref 150–400)
RBC: 3.59 MIL/uL — ABNORMAL LOW (ref 3.87–5.11)
RDW: 13.4 % (ref 11.5–15.5)
WBC: 6.8 10*3/uL (ref 4.0–10.5)
nRBC: 0 % (ref 0.0–0.2)

## 2022-04-29 MED ORDER — OXYCODONE HCL 5 MG PO TABS
5.0000 mg | ORAL_TABLET | Freq: Four times a day (QID) | ORAL | Status: DC | PRN
  Administered 2022-04-30: 5 mg via ORAL
  Filled 2022-04-29 (×2): qty 1

## 2022-04-29 MED ORDER — ACETAMINOPHEN 500 MG PO TABS
1000.0000 mg | ORAL_TABLET | Freq: Three times a day (TID) | ORAL | Status: DC
  Administered 2022-04-29 – 2022-04-30 (×3): 1000 mg via ORAL
  Filled 2022-04-29 (×3): qty 2

## 2022-04-29 NOTE — TOC Progression Note (Addendum)
Transition of Care [facility]) - Progression Note   Patient Details  Name: LANDA MULLINAX MRN: [id number] Date of Birth: 1934-05-16  Transition of Care [facility]) CM/SW [place], LCSW Phone Number: 04/29/2022, 4:28 PM  Clinical Narrative: CSW notified by [place] at [facility] that insurance authorization. CSW spoke with Joneen Caraway, the [employer] screener, and the assessment has been completed. PASRR number received: [id number] [facility] can accept the patient tomorrow morning. Hospitalist, daughter, and charge RN updated regarding discharge tomorrow. Patient will go to room 401P and the number for report is [phone].    Expected Discharge Plan: [place] Barriers to Discharge: [place] [facility])  Expected Discharge Plan and Services In-house Referral: Clinical Social Work Post Acute Care Choice: [place] Living arrangements for the past 2 months: Single Family Home           DME Arranged: N/A DME Agency: NA  Social Determinants of Health (SDOH) Interventions SDOH Screenings   Tobacco Use: Medium Risk (04/26/2022)   Readmission Risk Interventions     No data to display

## 2022-04-29 NOTE — Care Management Important Message (Signed)
Important Message  Patient Details IM Letter given. Name: Kara Davis MRN: [id number] Date of Birth: Aug 28, 1934   Medicare Important Message Given:  Yes     Kerin Salen 04/29/2022, 3:32 PM

## 2022-04-29 NOTE — Progress Notes (Signed)
  Progress Note   Patient: Kara Davis JJK:[id number] DOB: 28-Jul-1934 DOA: 04/23/2022     6 DOS: the patient was seen and examined on 04/29/2022 at 12:44 PM      Brief hospital course: Kara Davis is an 88 year old F with dementia, lives at home, HTN, hypothyroidism who presented after a fall unable to stand   In the ER, radiographs showed displaced subcapital right femoral neck fracture.   For total right hip replacement by Dr. Alvan Dame on 1/21   Post-op course complicated by hyponatremia      Assessment and Plan: * Closed right hip fracture [facility]) S/p anterior total right hip replacement on 1/21 -Consult orthopedics, appreciate cares - Analgesics - PT - WBAT - ASA 81 BID for DVT PPx  Hyponatremia Sodium stable 127.   - Continue fluid restriction - Stop SSRI at discharge  Normocytic anemia Normocytic. Postoperative anemia ruled out. This is expected blood loss, requires no further treatment, iron studies normal.   Hypothyroidism - Continue levothyroxine  Essential hypertension Are normal, not on medication at home  Anxiety with depression - Continue home Xanax nightly as needed - Stop SSRI  Memory loss Delirium precautions           Subjective: Patient is fairly anxious, but no agitation, no falls, no confusion, no fever, respiratory distress, no chest pain.     Physical Exam: BP 138/70 (BP Location: Left Arm)   Pulse 87   Temp 98.3 F (36.8 C) (Oral)   Resp 17   Ht '5\' 3"'$  (1.6 m)   Wt 53 kg   SpO2 100%   BMI 20.70 kg/m   Elderly adult female, sitting up in bed, interactive and appropriate, eating lunch RRR, no murmurs, no peripheral edema Respiratory rate normal, lungs clear without rales or wheezes Attentive to conversation, face symmetric, affect seems relatively normal, speech fluent, moves upper extremities with normal strength and coordination    Data Reviewed: Patient metabolic panel shows sodium 127, hemoglobin 9.9, no  change  Family Communication: Son at the bedside, daughter by phone    Disposition: Status is: Inpatient Patient was admitted for hip fracture, she still had a repair, and has had 2-day delay waiting for PASSR number, she has been medically stable for discharge since yesterday.        Author: Edwin Dada, MD 04/29/2022 2:54 PM  For on call review [email or web address].

## 2022-04-29 NOTE — Assessment & Plan Note (Signed)
-  Continue levothyroxine 

## 2022-04-29 NOTE — Assessment & Plan Note (Signed)
-  Standard delirium precautions: blinds open and lights on during day, TV off, minimize interruptions at night, glasses/hearing aids, PT/OT, avoiding [place] list medications

## 2022-04-29 NOTE — Assessment & Plan Note (Signed)
-  Continue home Xanax nightly as needed - Stop SSRI

## 2022-04-29 NOTE — Assessment & Plan Note (Signed)
Are normal, not on medication at home

## 2022-04-30 DIAGNOSIS — Z96641 Presence of right artificial hip joint: Secondary | ICD-10-CM | POA: Diagnosis not present

## 2022-04-30 DIAGNOSIS — S72001D Fracture of unspecified part of neck of right femur, subsequent encounter for closed fracture with routine healing: Secondary | ICD-10-CM | POA: Diagnosis not present

## 2022-04-30 DIAGNOSIS — I729 Aneurysm of unspecified site: Secondary | ICD-10-CM | POA: Diagnosis not present

## 2022-04-30 DIAGNOSIS — R52 Pain, unspecified: Secondary | ICD-10-CM | POA: Diagnosis not present

## 2022-04-30 DIAGNOSIS — Z636 Dependent relative needing care at home: Secondary | ICD-10-CM | POA: Diagnosis not present

## 2022-04-30 DIAGNOSIS — F29 Unspecified psychosis not due to a substance or known physiological condition: Secondary | ICD-10-CM | POA: Diagnosis not present

## 2022-04-30 DIAGNOSIS — R2689 Other abnormalities of gait and mobility: Secondary | ICD-10-CM | POA: Diagnosis not present

## 2022-04-30 DIAGNOSIS — Z743 Need for continuous supervision: Secondary | ICD-10-CM | POA: Diagnosis not present

## 2022-04-30 DIAGNOSIS — K5900 Constipation, unspecified: Secondary | ICD-10-CM | POA: Diagnosis not present

## 2022-04-30 DIAGNOSIS — R110 Nausea: Secondary | ICD-10-CM | POA: Diagnosis not present

## 2022-04-30 DIAGNOSIS — R142 Eructation: Secondary | ICD-10-CM | POA: Diagnosis not present

## 2022-04-30 DIAGNOSIS — H6993 Unspecified Eustachian tube disorder, bilateral: Secondary | ICD-10-CM | POA: Diagnosis not present

## 2022-04-30 DIAGNOSIS — R413 Other amnesia: Secondary | ICD-10-CM | POA: Diagnosis not present

## 2022-04-30 DIAGNOSIS — D649 Anemia, unspecified: Secondary | ICD-10-CM | POA: Diagnosis not present

## 2022-04-30 DIAGNOSIS — M25569 Pain in unspecified knee: Secondary | ICD-10-CM | POA: Diagnosis not present

## 2022-04-30 DIAGNOSIS — R300 Dysuria: Secondary | ICD-10-CM | POA: Diagnosis not present

## 2022-04-30 DIAGNOSIS — R4189 Other symptoms and signs involving cognitive functions and awareness: Secondary | ICD-10-CM | POA: Diagnosis not present

## 2022-04-30 DIAGNOSIS — R69 Illness, unspecified: Secondary | ICD-10-CM | POA: Diagnosis not present

## 2022-04-30 DIAGNOSIS — E039 Hypothyroidism, unspecified: Secondary | ICD-10-CM | POA: Diagnosis not present

## 2022-04-30 DIAGNOSIS — R278 Other lack of coordination: Secondary | ICD-10-CM | POA: Diagnosis not present

## 2022-04-30 DIAGNOSIS — Z471 Aftercare following joint replacement surgery: Secondary | ICD-10-CM | POA: Diagnosis not present

## 2022-04-30 DIAGNOSIS — I10 Essential (primary) hypertension: Secondary | ICD-10-CM | POA: Diagnosis not present

## 2022-04-30 DIAGNOSIS — F418 Other specified anxiety disorders: Secondary | ICD-10-CM

## 2022-04-30 DIAGNOSIS — E871 Hypo-osmolality and hyponatremia: Secondary | ICD-10-CM | POA: Diagnosis not present

## 2022-04-30 DIAGNOSIS — H900 Conductive hearing loss, bilateral: Secondary | ICD-10-CM | POA: Diagnosis not present

## 2022-04-30 DIAGNOSIS — R410 Disorientation, unspecified: Secondary | ICD-10-CM | POA: Diagnosis not present

## 2022-04-30 DIAGNOSIS — R209 Unspecified disturbances of skin sensation: Secondary | ICD-10-CM | POA: Diagnosis not present

## 2022-04-30 DIAGNOSIS — Z515 Encounter for palliative care: Secondary | ICD-10-CM | POA: Diagnosis not present

## 2022-04-30 DIAGNOSIS — Z7401 Bed confinement status: Secondary | ICD-10-CM | POA: Diagnosis not present

## 2022-04-30 DIAGNOSIS — R4781 Slurred speech: Secondary | ICD-10-CM | POA: Diagnosis not present

## 2022-04-30 DIAGNOSIS — M6281 Muscle weakness (generalized): Secondary | ICD-10-CM | POA: Diagnosis not present

## 2022-04-30 DIAGNOSIS — R4182 Altered mental status, unspecified: Secondary | ICD-10-CM | POA: Diagnosis not present

## 2022-04-30 DIAGNOSIS — R6510 Systemic inflammatory response syndrome (SIRS) of non-infectious origin without acute organ dysfunction: Secondary | ICD-10-CM | POA: Diagnosis not present

## 2022-04-30 DIAGNOSIS — S72001A Fracture of unspecified part of neck of right femur, initial encounter for closed fracture: Secondary | ICD-10-CM | POA: Diagnosis not present

## 2022-04-30 DIAGNOSIS — S71001A Unspecified open wound, right hip, initial encounter: Secondary | ICD-10-CM | POA: Diagnosis not present

## 2022-04-30 DIAGNOSIS — D62 Acute posthemorrhagic anemia: Secondary | ICD-10-CM | POA: Diagnosis not present

## 2022-04-30 DIAGNOSIS — R8281 Pyuria: Secondary | ICD-10-CM | POA: Diagnosis not present

## 2022-04-30 LAB — BASIC METABOLIC PANEL
Anion gap: 7 (ref 5–15)
BUN: 13 mg/dL (ref 8–23)
CO2: 27 mmol/L (ref 22–32)
Calcium: 8.1 mg/dL — ABNORMAL LOW (ref 8.9–10.3)
Chloride: 93 mmol/L — ABNORMAL LOW (ref 98–111)
Creatinine, Ser: 0.76 mg/dL (ref 0.44–1.00)
GFR, Estimated: >60 mL/min (ref >60)
Glucose, Bld: 103 mg/dL — ABNORMAL HIGH (ref 70–99)
Potassium: 5.1 mmol/L (ref 3.5–5.1)
Sodium: 127 mmol/L — ABNORMAL LOW (ref 135–145)

## 2022-04-30 MED ORDER — ACETAMINOPHEN 500 MG PO TABS: 1000.0000 mg | ORAL_TABLET | Freq: Three times a day (TID) | ORAL | 0 refills | Status: AC

## 2022-04-30 MED ORDER — BISACODYL 10 MG RE SUPP: 10.0000 mg | Freq: Every day | RECTAL | 0 refills | Status: AC | PRN

## 2022-04-30 MED ORDER — FE FUM-VIT C-VIT B12-FA 460-60-0.01-1 MG PO CAPS: 1.0000 | ORAL_CAPSULE | Freq: Every day | ORAL | 0 refills | Status: AC

## 2022-04-30 MED ORDER — DOCUSATE SODIUM 100 MG PO CAPS: 100.0000 mg | ORAL_CAPSULE | Freq: Two times a day (BID) | ORAL | 0 refills | Status: AC

## 2022-04-30 MED ORDER — OXYCODONE HCL 5 MG PO TABS: 5.0000 mg | ORAL_TABLET | Freq: Four times a day (QID) | ORAL | 0 refills | Status: DC | PRN

## 2022-04-30 MED ORDER — ALPRAZOLAM 0.25 MG PO TABS: 0.2500 mg | ORAL_TABLET | Freq: Every evening | ORAL | 0 refills | Status: AC | PRN

## 2022-04-30 MED ORDER — ALPRAZOLAM 0.25 MG PO TABS: 0.2500 mg | ORAL_TABLET | Freq: Every evening | ORAL | 0 refills | Status: DC | PRN

## 2022-04-30 MED ORDER — OXYCODONE HCL 5 MG PO TABS: 5.0000 mg | ORAL_TABLET | Freq: Four times a day (QID) | ORAL | 0 refills | Status: AC | PRN

## 2022-04-30 MED ORDER — SODIUM CHLORIDE 1 G PO TABS: 1.0000 g | ORAL_TABLET | Freq: Two times a day (BID) | ORAL | 0 refills | Status: AC

## 2022-04-30 NOTE — Plan of Care (Signed)

## 2022-04-30 NOTE — TOC Transition Note (Signed)
Transition of Care [facility]) - CM/SW Discharge Note  Patient Details  Name: Kara Davis MRN: [id number] Date of Birth: 1934/12/05  Transition of Care [facility]) CM/SW Contact:  Sherie Don, LCSW Phone Number: 04/30/2022, 9:35 AM  Clinical Narrative: Patient will discharge to [facility] SNF today. Discharge summary, discharge orders, and SNF transfer report faxed to facility in hub. CSW notified daughter of discharge and transportation being set up. Medical necessity form done; PTAR scheduled. Discharge packet completed. RN updated. TOC signing off.  Final next level of care: Skilled Nursing Facility Barriers to Discharge: Barriers Resolved  Patient Goals and CMS Choice CMS Medicare.gov Compare Post Acute Care list provided to:: Patient Represenative (must comment) Choice offered to / list presented to : Adult Children  Discharge Placement PASRR number recieved: 04/30/22      Patient chooses bed at: [facility] Patient to be transferred to facility by: [facility] Name of family member notified: [name] (daughter) Patient and family notified of of transfer: 04/30/22  Discharge Plan and Services Additional resources added to the After Visit Summary for   In-house Referral: Clinical Social Work Post Acute Care Choice: [place]          DME Arranged: N/A DME Agency: NA  Social Determinants of Health ([place]) Interventions SDOH Screenings   Tobacco Use: Medium Risk (04/26/2022)   Readmission Risk Interventions     No data to display

## 2022-04-30 NOTE — Discharge Summary (Signed)
Physician Discharge Summary   Patient: Kara Davis MRN: [id number] DOB: October 04, 1934  Admit date:     04/23/2022  Discharge date: 04/30/22  Discharge Physician: Edwin Dada   PCP: Ginger Organ., MD     Recommendations at discharge:  [facility]: Please given salt tab 1gm BID for 4 days then check Na in 1 week  Follow up with Orthopedics Dr. Alvan Dame in 2 weeks Aspirin 81 mg BID for DVT PPx until discontinued by orthopedics Dr. Brigitte Pulse: Check iron studies in 6 weeks Sertraline stopped due to fall     Discharge Diagnoses: Principal Problem:   Closed right hip fracture ([place]) Active Problems:   S/P total right hip arthroplasty   Hyponatremia   Normocytic anemia   Hypothyroidism   Essential hypertension   Anxiety with depression   Memory loss       Hospital Course: Kara Davis is an 88 year old F with dementia, lives at home, HTN, hypothyroidism who presented after a fall unable to stand   In the ER, radiographs showed displaced subcapital right femoral neck fracture.   For total right hip replacement by Dr. Alvan Dame on 1/21   Post-op course complicated by hyponatremia       * Closed right hip fracture [facility]) S/p anterior total right hip replacement on 1/21  Weight bearing as tolerated.  Follow up with Dr. Alvan Dame in 2 weeks.  Aspirin for DVT ppx.      Hyponatremia Baseline around 135, here dropped to 123 post op.  Improved to 127-130 and asymptomatic.    Recommend salt tabs BID and recheck sodium in 1 week. Stop salt tabs as appropriate.      Normocytic anemia Normocytic. Postoperative anemia ruled out.   Discharged on new iron.    Hypothyroidism On levothyroxine  Essential hypertension BP normal, not on medication at home  Anxiety with depression Had started sertraline 1 day prior to fall.  Discontinued here.  Not rsetarted due to family request and also hyponatremia.     Memory loss Recommend delirium precautions at SNF              [facility] was reviewed for this patient prior to discharge.  Consultants: Orthopedics Procedures performed:  Total hip arthroplasty   Disposition: Skilled nursing facility   DISCHARGE MEDICATION: Allergies as of 04/30/2022   No Known Allergies      Medication List     STOP taking these medications    HYDROcodone-acetaminophen 5-325 MG tablet Commonly known as: NORCO/VICODIN   sertraline 50 MG tablet Commonly known as: ZOLOFT       TAKE these medications    acetaminophen 500 MG tablet Commonly known as: TYLENOL Take 2 tablets (1,000 mg total) by mouth 3 (three) times daily.   ALPRAZolam 0.25 MG tablet Commonly known as: XANAX Take 1 tablet (0.25 mg total) by mouth at bedtime as needed for anxiety.   aspirin 81 MG chewable tablet Chew 1 tablet (81 mg total) by mouth 2 (two) times daily for 28 days.   bisacodyl 10 MG suppository Commonly known as: DULCOLAX Place 1 suppository (10 mg total) rectally daily as needed for moderate constipation.   docusate sodium 100 MG capsule Commonly known as: COLACE Take 1 capsule (100 mg total) by mouth 2 (two) times daily.   Fe Fum-Vit C-Vit B12-FA Caps capsule Commonly known as: TRIGELS-F FORTE Take 1 capsule by mouth daily after breakfast.   levothyroxine 75 MCG tablet Commonly known as: SYNTHROID Take  75 mcg by mouth daily before breakfast.   melatonin 5 MG Tabs Take 5 mg by mouth at bedtime as needed (sleep).   methocarbamol 500 MG tablet Commonly known as: ROBAXIN Take 1 tablet (500 mg total) by mouth every 8 (eight) hours as needed for muscle spasms.   oxyCODONE 5 MG immediate release tablet Commonly known as: Oxy IR/ROXICODONE Take 1 tablet (5 mg total) by mouth every 6 (six) hours as needed for moderate pain.   Similasan Dry Eye Relief Soln Place 1 drop into both eyes as needed (dry eye).   sodium chloride 1 g tablet Take 1 tablet (1 g total) by mouth 2  (two) times daily with a meal for 4 days.   Vitamin D-3 25 MCG (1000 UT) Caps Take 1,000 Units by mouth once a week.   Xlear Sinus Care Spray Soln Place 2 sprays into both nostrils as needed (dryness).        Contact information for follow-up providers     Ginger Organ., MD .   Specialty: Internal Medicine Contact information: [place] [address] [phone]         Paralee Cancel, MD. Schedule an appointment as soon as possible for a visit in 2 week(s).   Specialty: Orthopedic Surgery Contact information: [address] [place] [address] [phone]              Contact information for after-discharge care     Destination     [facility]erred [facility] .   Service: Skilled Nursing Contact information: [address] [facility] [address] Update Address [place] [phone]                     Discharge Instructions     Increase activity slowly   Complete by: As directed        Discharge Exam: Filed Weights   04/25/22 0100  Weight: 53 kg    General: [name] alert, awake, not in acute distress, sitting up in bed, interactive Cardiovascular: RRR, nl S1-S2, no murmurs appreciated.   No LE edema.   Respiratory: Normal respiratory rate and rhythm.  CTAB without rales or wheezes. Abdominal: Abdomen soft, mild nonfocal tenderness, no guarding.  No distension or HSM.   Neuro/Psych: Strength symmetric in upper and lower extremities.  Judgment and insight appear at baseline, slight dementia, oriented to self, family, place.   Condition at discharge: Stable  The results of significant diagnostics from this hospitalization (including imaging, microbiology, ancillary and laboratory) are listed below for reference.   Imaging Studies: DG Pelvis Portable  Result Date: 04/25/2022 CLINICAL DATA:  Status post right total hip arthroplasty. EXAM: PORTABLE PELVIS 1-2 VIEWS COMPARISON:  Earlier  same day FINDINGS: Portable view shows right total hip replacement. No evidence for immediate hardware complication. Bones are diffusely demineralized. Gas in the overlying soft tissues compatible with immediate postoperative state. IMPRESSION: Status post right total hip replacement without immediate hardware complication. Electronically Signed   By: Misty Stanley M.D.   On: 04/25/2022 10:30   DG HIP UNILAT WITH PELVIS 1V RIGHT  Result Date: 04/25/2022 CLINICAL DATA:  Status post right hip arthroplasty. EXAM: DG HIP (WITH OR WITHOUT PELVIS) 1V RIGHT COMPARISON:  04/23/2022 FINDINGS: Two images obtained via C-arm radiography in the operating room show postsurgical change from right total hip arthroplasty. No signs of dislocation or periprosthetic fracture. IMPRESSION: Status post right total hip arthroplasty. Electronically Signed   By: Lovena Le  Clovis Riley M.D.   On: 04/25/2022 09:06   DG C-Arm 1-60 Min-No Report  Result Date: 04/25/2022 Fluoroscopy was utilized by the requesting physician.  No radiographic interpretation.   DG Chest Port 1 View  Result Date: 04/23/2022 CLINICAL DATA:  Un witnessed fall. EXAM: PORTABLE CHEST 1 VIEW COMPARISON:  None Available. FINDINGS: Lungs are hyperexpanded. The cardio pericardial silhouette is enlarged. There is pulmonary vascular congestion without overt pulmonary edema. Similar scarring noted at the right lung base. The visualized bony structures of the thorax are unremarkable. IMPRESSION: Emphysema with vascular congestion. No acute cardiopulmonary findings. Electronically Signed   By: Misty Stanley M.D.   On: 04/23/2022 18:44   DG Hip Unilat W or Wo Pelvis 2-3 Views Right  Result Date: 04/23/2022 CLINICAL DATA:  Pain after fall EXAM: DG HIP (WITH OR WITHOUT PELVIS) 2V RIGHT COMPARISON:  None Available. FINDINGS: Displaced subcapital right femoral neck fracture. Preserved joint spaces. Osteopenia. Mild sclerosis along the pubic symphysis. IMPRESSION: Displaced  impacted subcapital femoral neck fracture. Electronically Signed   By: Jill Side M.D.   On: 04/23/2022 18:38   DG Knee Complete 4 Views Left  Result Date: 04/23/2022 CLINICAL DATA:  Pain after fall EXAM: LEFT KNEE - COMPLETE 4 VIEW COMPARISON:  None Available. FINDINGS: Chondrocalcinosis identified greatest of the lateral compartment. No fracture or dislocation. Osteopenia. No joint effusion. Preserved joint spaces. IMPRESSION: Chondrocalcinosis.  Osteopenia Electronically Signed   By: Jill Side M.D.   On: 04/23/2022 18:37   DG Knee Complete 4 Views Right  Result Date: 04/23/2022 CLINICAL DATA:  Unwitnessed fall. EXAM: RIGHT KNEE - COMPLETE 4+ VIEW COMPARISON:  None Available. FINDINGS: Exam is limited by patient positioning. Within this limitation, no fracture or dislocation is evident. No evidence for joint effusion. Degenerative changes are seen in all 3 compartments. No worrisome lytic or sclerotic osseous abnormality. IMPRESSION: 1. No acute bony findings.  No joint effusion. 2. Degenerative changes in all 3 compartments. Electronically Signed   By: Misty Stanley M.D.   On: 04/23/2022 18:37   DG Pelvis 1-2 Views  Result Date: 04/23/2022 CLINICAL DATA:  Pain after fall EXAM: PELVIS - 1-2 VIEW COMPARISON:  X-ray 2007.  CT 07/06/2021 of the abdomen and pelvis FINDINGS: Subcapital displaced right femoral neck fracture identified. No additional fracture or dislocation. Osteopenia. IMPRESSION: Subcapital displaced right femoral neck fracture . Electronically Signed   By: Jill Side M.D.   On: 04/23/2022 18:36    Microbiology: Results for orders placed or performed during the hospital encounter of 04/23/22  Surgical pcr screen     Status: None   Collection Time: 04/25/22  1:37 AM   Specimen: Nasal Mucosa; Nasal Swab  Result Value Ref Range Status   MRSA, PCR NEGATIVE NEGATIVE Final   Staphylococcus aureus NEGATIVE NEGATIVE Final    Comment: (NOTE) The Xpert SA Assay (FDA approved for  NASAL specimens in patients 43 years of age and older), is one component of a comprehensive surveillance program. It is not intended to diagnose infection nor to guide or monitor treatment. Performed at [facility], [place] [address], [place],  [address]     Labs: CBC: Recent Labs  Lab 04/23/22 1858 04/24/22 0546 04/26/22 0343 04/27/22 0354 04/28/22 0339 04/29/22 0348  WBC 17.1* 10.2 11.9* 7.9 6.7 6.8  NEUTROABS 15.1*  --   --   --   --   --   HGB 13.7 13.8 10.2* 9.6* 9.6* 9.9*  HCT 41.2 42.0 30.4* 28.8* 29.5* 30.4*  MCV 83.1 83.5 84.2 83.7 84.3 84.7  PLT 340 354 240 230 255 681   Basic Metabolic Panel: Recent Labs  Lab 04/26/22 0343 04/27/22 0354 04/28/22 0339 04/29/22 0348 04/30/22 0338  NA 128* 123* 130* 127* 127*  K 4.6 5.0 5.0 4.6 5.1  CL 96* 92* 97* 94* 93*  CO2 '26 28 26 26 27  '$ GLUCOSE 113* 90 96 110* 103*  BUN '15 18 17 12 13  '$ CREATININE 0.82 0.73 0.68 0.60 0.76  CALCIUM 8.2* 7.8* 8.2* 8.1* 8.1*   Liver Function Tests: No results for input(s): "AST", "ALT", "ALKPHOS", "BILITOT", "PROT", "ALBUMIN" in the last 168 hours. CBG: No results for input(s): "GLUCAP" in the last 168 hours.  Discharge time spent: approximately 35 minutes spent on discharge counseling, evaluation of patient on day of discharge, and coordination of discharge planning with nursing, social work, pharmacy and case management  Signed: Edwin Dada, MD [facility] 04/30/2022

## 2022-05-04 DIAGNOSIS — E039 Hypothyroidism, unspecified: Secondary | ICD-10-CM | POA: Diagnosis not present

## 2022-05-04 DIAGNOSIS — R413 Other amnesia: Secondary | ICD-10-CM | POA: Diagnosis not present

## 2022-05-04 DIAGNOSIS — S71001A Unspecified open wound, right hip, initial encounter: Secondary | ICD-10-CM | POA: Diagnosis not present

## 2022-05-04 DIAGNOSIS — S72001D Fracture of unspecified part of neck of right femur, subsequent encounter for closed fracture with routine healing: Secondary | ICD-10-CM | POA: Diagnosis not present

## 2022-05-13 DIAGNOSIS — I10 Essential (primary) hypertension: Secondary | ICD-10-CM

## 2022-05-13 DIAGNOSIS — E871 Hypo-osmolality and hyponatremia: Secondary | ICD-10-CM | POA: Diagnosis not present

## 2022-05-13 DIAGNOSIS — S72001D Fracture of unspecified part of neck of right femur, subsequent encounter for closed fracture with routine healing: Secondary | ICD-10-CM | POA: Diagnosis not present

## 2022-05-21 DIAGNOSIS — M6281 Muscle weakness (generalized): Secondary | ICD-10-CM | POA: Diagnosis not present

## 2022-05-21 DIAGNOSIS — R2689 Other abnormalities of gait and mobility: Secondary | ICD-10-CM | POA: Diagnosis not present

## 2022-05-21 DIAGNOSIS — F419 Anxiety disorder, unspecified: Secondary | ICD-10-CM | POA: Diagnosis not present

## 2022-05-21 DIAGNOSIS — R69 Illness, unspecified: Secondary | ICD-10-CM | POA: Diagnosis not present

## 2022-05-21 DIAGNOSIS — Z471 Aftercare following joint replacement surgery: Secondary | ICD-10-CM | POA: Diagnosis not present

## 2022-05-25 DIAGNOSIS — R2689 Other abnormalities of gait and mobility: Secondary | ICD-10-CM | POA: Diagnosis not present

## 2022-05-25 DIAGNOSIS — Z9181 History of falling: Secondary | ICD-10-CM | POA: Diagnosis not present

## 2022-05-25 DIAGNOSIS — M6281 Muscle weakness (generalized): Secondary | ICD-10-CM | POA: Diagnosis not present

## 2022-05-27 DIAGNOSIS — K5903 Drug induced constipation: Secondary | ICD-10-CM | POA: Diagnosis not present

## 2022-05-27 DIAGNOSIS — G4701 Insomnia due to medical condition: Secondary | ICD-10-CM | POA: Diagnosis not present

## 2022-05-27 DIAGNOSIS — M6281 Muscle weakness (generalized): Secondary | ICD-10-CM | POA: Diagnosis not present

## 2022-05-27 DIAGNOSIS — E038 Other specified hypothyroidism: Secondary | ICD-10-CM | POA: Diagnosis not present

## 2022-05-27 DIAGNOSIS — E039 Hypothyroidism, unspecified: Secondary | ICD-10-CM | POA: Diagnosis not present

## 2022-05-27 DIAGNOSIS — R69 Illness, unspecified: Secondary | ICD-10-CM | POA: Diagnosis not present

## 2022-05-27 DIAGNOSIS — G894 Chronic pain syndrome: Secondary | ICD-10-CM | POA: Diagnosis not present

## 2022-05-27 DIAGNOSIS — R54 Age-related physical debility: Secondary | ICD-10-CM | POA: Diagnosis not present

## 2022-05-27 DIAGNOSIS — E559 Vitamin D deficiency, unspecified: Secondary | ICD-10-CM | POA: Diagnosis not present

## 2022-05-27 DIAGNOSIS — D500 Iron deficiency anemia secondary to blood loss (chronic): Secondary | ICD-10-CM | POA: Diagnosis not present

## 2022-05-31 DIAGNOSIS — N390 Urinary tract infection, site not specified: Secondary | ICD-10-CM | POA: Diagnosis not present

## 2022-06-04 DIAGNOSIS — R69 Illness, unspecified: Secondary | ICD-10-CM | POA: Diagnosis not present

## 2022-06-04 DIAGNOSIS — G4700 Insomnia, unspecified: Secondary | ICD-10-CM | POA: Diagnosis not present

## 2022-06-14 DIAGNOSIS — N390 Urinary tract infection, site not specified: Secondary | ICD-10-CM | POA: Diagnosis not present

## 2022-06-15 ENCOUNTER — Non-Acute Institutional Stay: Payer: Medicare HMO | Admitting: Family Medicine

## 2022-06-15 VITALS — BP 112/68 | HR 68 | Temp 97.8°F | Resp 14

## 2022-06-15 DIAGNOSIS — E871 Hypo-osmolality and hyponatremia: Secondary | ICD-10-CM

## 2022-06-15 DIAGNOSIS — R69 Illness, unspecified: Secondary | ICD-10-CM | POA: Diagnosis not present

## 2022-06-15 DIAGNOSIS — G309 Alzheimer's disease, unspecified: Secondary | ICD-10-CM | POA: Diagnosis not present

## 2022-06-15 DIAGNOSIS — F02B4 Dementia in other diseases classified elsewhere, moderate, with anxiety: Secondary | ICD-10-CM | POA: Diagnosis not present

## 2022-06-17 ENCOUNTER — Encounter: Payer: Self-pay | Admitting: Family Medicine

## 2022-06-17 NOTE — Progress Notes (Signed)
[place] Consult Note Telephone: [phone]  Fax: [phone]   Date of encounter: 06/15/22 4:10 PM PATIENT NAME: Kara Davis [place] [id number]   [phone] (home)  DOB: 26-Feb-1935 MRN: [id number] PRIMARY CARE PROVIDER:    Garwin Brothers, MD,  [place] [address] [phone]  REFERRING PROVIDER:   Garwin Brothers, MD [place] [address],  [place] [address] [place] of Attorney:    [place] Information     Name Relation Home Work [place] Daughter [phone]  [phone]        I met face to face with patient and her daughter in [facility] facility. Palliative Care was asked to follow this patient by consultation request of Garwin Brothers, MD to address advance care planning and complex medical decision making. This is a follow up visit.  ADVANCE CARE PLANNING/GOALS OF CARE: Review of an existing advance directive document-DNR  CODE STATUS: DNR   ASSESSMENT AND / RECOMMENDATIONS:  PPS: 60%  Dementia with anxiety FAST 7 Score 6c Currently supplemented with Vitamin B12, no specific cognitive medications. Has Xanax at bedtime for anxiety Recommend development of new routine in ALF and continue monitoring  2.  Recent hypnatremia Mid 120s in January. Uncertain etiology Recommend repeat BMP    Follow up Palliative Care Visit:  Palliative Care continuing to follow up by monitoring for changes in appetite, weight, functional and cognitive status for chronic disease progression and management in agreement with patient's stated goals of care. Next visit in 4 weeks or prn.  This visit was coded based on medical decision making (MDM).  Chief Complaint  Palliative Care is continuing to follow pt for chronic medical management of symptoms in setting of dementia.  HISTORY OF PRESENT ILLNESS: Kara Davis is a 88 y.o. year old female with  Dementia. Palliative Care had been following pt at home for medical management of symptoms in setting of dementia and pt was placed in ALF where new referral was received. On arrival pt's daughter Kara Davis had brought her dinner, was getting her OOB to eat and pt was observed to eat approximately 75% of her dinner. Almost immediately after eating pt wanted to return to lay down. Denies pain, SOB, nausea/vomiting, dysuria or falls.  Pt and daughter relayed recent incident where pt became combative as staff was attempting to help her in the bathroom causing her hands/wrists to become bruised in the ensuing struggle.  Pt believes she does not need help in the bathroom.   ACTIVITIES OF DAILY LIVING: CONTINENT OF BLADDER/Bowel? Yes  BATHING/DRESSING-needs minimal assistance/standby with supervision and cueing FEEDING-independent after set up  MOBILITY:  AMBULATORY WITH ASSISTIVE DEVICE: rolling walker  APPETITE? good  CURRENT PROBLEM LIST:  Patient Active Problem List   Diagnosis Date Noted   Normocytic anemia 04/26/2022   S/P total right hip arthroplasty 04/25/2022   Closed right hip fracture ([place]) 04/23/2022   Memory loss 04/23/2022   Caregiver stress 01/03/2022   Palliative care encounter 05/30/2021   Cognitive impairment 05/30/2021   Slurred speech 03/09/2021   Conductive hearing loss of both ears 09/17/2015   Dysfunction of both eustachian tubes 09/17/2015   Belching 04/20/2013   Nausea alone 11/02/2012   Constipation 11/02/2012   Dysuria 11/02/2012   Pyuria 03/20/2012   Knee pain 02/25/2012   Disturbance of skin sensation 02/25/2012   Aneurysm ([place]) 10/01/2011   Hyponatremia  03/19/2011   Anemia, unspecified 03/19/2011   Aneurysm, 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/15/2011   UTI (lower urinary tract infection) 03/15/2011   Hypothyroidism 03/13/2011   Abdominal pain, other specified site 10/12/2010   Routine general medical examination at a health care facility 08/19/2010   Encounter for  long-term (current) use of other medications 08/19/2010   Gross hematuria 08/19/2010   Numbness 08/19/2010   Unspecified disorder of liver 08/19/2010   UTI 03/31/2010   CHILLS WITHOUT FEVER 03/31/2010   Abdominal pain 08/18/2009   VITAMIN D DEFICIENCY 05/23/2008   Palpitations 05/23/2008   Essential hypertension 07/27/2007   Anxiety with depression 12/06/2006   DYSPEPSIA 12/06/2006   Diverticulosis of colon 12/06/2006   Irritable bowel syndrome 12/06/2006   ABDOMINAL PAIN, CHRONIC 12/06/2006   HYPERGLYCEMIA 12/06/2006   PAST MEDICAL HISTORY:  Active Ambulatory Problems    Diagnosis Date Noted   VITAMIN D DEFICIENCY 05/23/2008   Anxiety with depression 12/06/2006   Essential hypertension 07/27/2007   DYSPEPSIA 12/06/2006   Diverticulosis of colon 12/06/2006   Irritable bowel syndrome 12/06/2006   Palpitations 05/23/2008   ABDOMINAL PAIN, CHRONIC 12/06/2006   Abdominal pain 08/18/2009   HYPERGLYCEMIA 12/06/2006   UTI 03/31/2010   CHILLS WITHOUT FEVER 03/31/2010   Routine general medical examination at a health care facility 08/19/2010   Encounter for long-term (current) use of other medications 08/19/2010   Gross hematuria 08/19/2010   Numbness 08/19/2010   Unspecified disorder of liver 08/19/2010   Abdominal pain, other specified site 10/12/2010   Hypothyroidism 03/13/2011   Aneurysm, 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/15/2011   UTI (lower urinary tract infection) 03/15/2011   Hyponatremia 03/19/2011   Anemia, unspecified 03/19/2011   Knee pain 02/25/2012   Disturbance of skin sensation 02/25/2012   Pyuria 03/20/2012   Nausea alone 11/02/2012   Constipation 11/02/2012   Dysuria 11/02/2012   Belching 04/20/2013   Aneurysm ([place]) 10/01/2011   Conductive hearing loss of both ears 09/17/2015   Dysfunction of both eustachian tubes 09/17/2015   Slurred speech 03/09/2021   Palliative care encounter 05/30/2021   Cognitive impairment 05/30/2021   Caregiver stress 01/03/2022   Closed  right hip fracture ([place]) 04/23/2022   Memory loss 04/23/2022   S/P total right hip arthroplasty 04/25/2022   Normocytic anemia 04/26/2022   Resolved Ambulatory Problems    Diagnosis Date Noted   HYPERCHOLESTEROLEMIA 07/27/2007   GERD 12/06/2006   Blurred vision, bilateral 03/13/2011   Hyponatremia 03/14/2011   Leg swelling 03/14/2011   Past Medical History:  Diagnosis Date   Cataracts, bilateral    Dementia ([place])    DEPRESSION 12/06/2006   DIVERTICULOSIS, COLON 12/06/2006   HYPERTENSION 07/27/2007   Osteoporosis    Tubular adenoma 09/09/2009     Preferred Pharmacy: ALLERGIES: No Known Allergies   PERTINENT MEDICATIONS:  Outpatient Encounter Medications as of 06/15/2022  Medication Sig   acetaminophen (TYLENOL) 500 MG tablet Take 2 tablets (1,000 mg total) by mouth 3 (three) times daily.   ALPRAZolam (XANAX) 0.25 MG tablet Take 1 tablet (0.25 mg total) by mouth at bedtime as needed for anxiety.   bisacodyl (DULCOLAX) 10 MG suppository Place 1 suppository (10 mg total) rectally daily as needed for moderate constipation.   Cholecalciferol (VITAMIN D-3) 25 MCG (1000 UT) CAPS Take 1,000 Units by mouth once a week.   docusate sodium (COLACE) 100 MG capsule Take 1 capsule (100 mg total) by mouth 2 (two) times daily.   Fe Fum-Vit C-Vit B12-FA (TRIGELS-F FORTE) CAPS capsule Take 1 capsule by mouth daily  after breakfast.   Homeopathic Products (SIMILASAN DRY EYE RELIEF) SOLN Place 1 drop into both eyes as needed (dry eye).   levothyroxine (SYNTHROID) 75 MCG tablet Take 75 mcg by mouth daily before breakfast.   melatonin 5 MG TABS Take 5 mg by mouth at bedtime as needed (sleep).   methocarbamol (ROBAXIN) 500 MG tablet Take 1 tablet (500 mg total) by mouth every 8 (eight) hours as needed for muscle spasms.   oxyCODONE (OXY IR/ROXICODONE) 5 MG immediate release tablet Take 1 tablet (5 mg total) by mouth every 6 (six) hours as needed for moderate pain.   Sodium Chloride-Xylitol (XLEAR SINUS  CARE SPRAY) SOLN Place 2 sprays into both nostrils as needed (dryness).   No facility-administered encounter medications on file as of 06/15/2022.    History obtained from review of EMR, discussion with daughter and/or patient.   CBC    Component Value Date/Time   WBC 6.8 04/29/2022 0348   RBC 3.59 (L) 04/29/2022 0348   HGB 9.9 (L) 04/29/2022 0348   HCT 30.4 (L) 04/29/2022 0348   PLT 308 04/29/2022 0348   MCV 84.7 04/29/2022 0348   MCH 27.6 04/29/2022 0348   MCHC 32.6 04/29/2022 0348   RDW 13.4 04/29/2022 0348   LYMPHSABS 0.3 (L) 04/23/2022 1858   MONOABS 1.6 (H) 04/23/2022 1858   EOSABS 0.0 04/23/2022 1858   BASOSABS 0.0 04/23/2022 1858    CMP    Latest Ref Rng & Units 04/30/2022    3:38 AM 04/29/2022    3:48 AM 04/28/2022    3:39 AM  CMP  Glucose 70 - 99 mg/dL 103  110  96   BUN 8 - 23 mg/dL '13  12  17   '$ Creatinine 0.44 - 1.00 mg/dL 0.76  0.60  0.68   Sodium 135 - 145 mmol/L 127  127  130   Potassium 3.5 - 5.1 mmol/L 5.1  4.6  5.0   Chloride 98 - 111 mmol/L 93  94  97   CO2 22 - 32 mmol/L '27  26  26   '$ Calcium 8.9 - 10.3 mg/dL 8.1  8.1  8.2     Urinalysis    Component Value Date/Time   COLORURINE YELLOW 04/23/2022 2242   APPEARANCEUR HAZY (A) 04/23/2022 2242   LABSPEC 1.013 04/23/2022 2242   PHURINE 7.0 04/23/2022 2242   GLUCOSEU NEGATIVE 04/23/2022 2242   GLUCOSEU NEGATIVE 11/29/2012 1007   HGBUR NEGATIVE 04/23/2022 2242   BILIRUBINUR NEGATIVE 04/23/2022 2242   KETONESUR NEGATIVE 04/23/2022 2242   PROTEINUR 30 (A) 04/23/2022 2242   UROBILINOGEN 0.2 05/25/2014 1332   NITRITE NEGATIVE 04/23/2022 2242   LEUKOCYTESUR TRACE (A) 04/23/2022 2242    BNP (last 3 results) Recent Labs    04/23/22 2026  BNP 91.3     I reviewed available labs, medications, imaging, studies and related documents from the EMR. Records reviewed and summarized above.   Physical Exam: GENERAL: NAD, mildly disheveled LUNGS: CTAB, no increased work of breathing, room air CARDIAC:   S1S2, RRR with no MRG, No edema/cyanosis ABD:  Normo-active BS x 4 quads, soft, non-tender EXTREMITIES: Normal ROM, no deformity, strength equal, No muscle atrophy/subcutaneous fat loss SKIN: noted purple brown fading ecchymoses of bilat hands/wrists worse on right hand/wrist NEURO:  No weakness, cognitive impairment with reduced safety awareness and judgement PSYCH:  non-anxious affect, A & O x 2  Thank you for the opportunity to participate in the care of ------. Please call our main office at [phone] if  we can be of additional assistance.    Damaris Hippo FNP-C  Wayland [email or web address] .[facility]  Phone:  [phone]

## 2022-06-21 DIAGNOSIS — E559 Vitamin D deficiency, unspecified: Secondary | ICD-10-CM | POA: Diagnosis not present

## 2022-06-21 DIAGNOSIS — E039 Hypothyroidism, unspecified: Secondary | ICD-10-CM | POA: Diagnosis not present

## 2022-06-21 DIAGNOSIS — D649 Anemia, unspecified: Secondary | ICD-10-CM | POA: Diagnosis not present

## 2022-06-24 DIAGNOSIS — E039 Hypothyroidism, unspecified: Secondary | ICD-10-CM | POA: Diagnosis not present

## 2022-06-24 DIAGNOSIS — D519 Vitamin B12 deficiency anemia, unspecified: Secondary | ICD-10-CM | POA: Diagnosis not present

## 2022-06-24 DIAGNOSIS — I10 Essential (primary) hypertension: Secondary | ICD-10-CM | POA: Diagnosis not present

## 2022-06-24 DIAGNOSIS — E559 Vitamin D deficiency, unspecified: Secondary | ICD-10-CM | POA: Diagnosis not present

## 2022-06-24 DIAGNOSIS — E782 Mixed hyperlipidemia: Secondary | ICD-10-CM | POA: Diagnosis not present

## 2022-07-01 DIAGNOSIS — E039 Hypothyroidism, unspecified: Secondary | ICD-10-CM | POA: Diagnosis not present

## 2022-07-01 DIAGNOSIS — F419 Anxiety disorder, unspecified: Secondary | ICD-10-CM | POA: Diagnosis not present

## 2022-07-02 DIAGNOSIS — R69 Illness, unspecified: Secondary | ICD-10-CM | POA: Diagnosis not present

## 2022-07-02 DIAGNOSIS — F419 Anxiety disorder, unspecified: Secondary | ICD-10-CM | POA: Diagnosis not present

## 2022-07-02 DIAGNOSIS — G4700 Insomnia, unspecified: Secondary | ICD-10-CM | POA: Diagnosis not present

## 2022-07-21 ENCOUNTER — Ambulatory Visit: Payer: Medicare HMO | Admitting: Podiatry

## 2022-07-28 DIAGNOSIS — D649 Anemia, unspecified: Secondary | ICD-10-CM | POA: Diagnosis not present

## 2022-07-28 DIAGNOSIS — E559 Vitamin D deficiency, unspecified: Secondary | ICD-10-CM | POA: Diagnosis not present

## 2022-07-28 DIAGNOSIS — E039 Hypothyroidism, unspecified: Secondary | ICD-10-CM | POA: Diagnosis not present

## 2022-07-29 DIAGNOSIS — E039 Hypothyroidism, unspecified: Secondary | ICD-10-CM | POA: Diagnosis not present

## 2022-07-29 DIAGNOSIS — F419 Anxiety disorder, unspecified: Secondary | ICD-10-CM | POA: Diagnosis not present

## 2022-07-30 DIAGNOSIS — G4700 Insomnia, unspecified: Secondary | ICD-10-CM | POA: Diagnosis not present

## 2022-07-30 DIAGNOSIS — F419 Anxiety disorder, unspecified: Secondary | ICD-10-CM | POA: Diagnosis not present

## 2022-08-10 DIAGNOSIS — G4700 Insomnia, unspecified: Secondary | ICD-10-CM | POA: Diagnosis not present

## 2022-08-10 DIAGNOSIS — E039 Hypothyroidism, unspecified: Secondary | ICD-10-CM | POA: Diagnosis not present

## 2022-08-10 DIAGNOSIS — K5903 Drug induced constipation: Secondary | ICD-10-CM | POA: Diagnosis not present

## 2022-08-20 ENCOUNTER — Non-Acute Institutional Stay: Payer: Medicare HMO | Admitting: Family Medicine

## 2022-08-20 ENCOUNTER — Encounter: Payer: Self-pay | Admitting: Family Medicine

## 2022-08-20 VITALS — BP 128/52 | HR 67 | Temp 98.6°F | Resp 18

## 2022-08-20 DIAGNOSIS — F02B4 Dementia in other diseases classified elsewhere, moderate, with anxiety: Secondary | ICD-10-CM | POA: Diagnosis not present

## 2022-08-20 DIAGNOSIS — F419 Anxiety disorder, unspecified: Secondary | ICD-10-CM | POA: Diagnosis not present

## 2022-08-20 DIAGNOSIS — G309 Alzheimer's disease, unspecified: Secondary | ICD-10-CM | POA: Diagnosis not present

## 2022-08-20 NOTE — Progress Notes (Unsigned)
Therapist, nutritional Palliative Care Consult Note Telephone: [phone]  Fax: [phone]   Date of encounter: 06/15/22 4:10 PM PATIENT NAME: Kara Davis [address] [place] [address]   [phone] (home)  DOB: 16-May-1934 MRN: [id number] PRIMARY CARE PROVIDER:    Eloisa Northern, MD,  [address] [place] [address] [phone]  REFERRING PROVIDER:   Eloisa Northern, MD [address] [place],  [place] [address] [phone]  Health Care Power of Attorney:    Kara Davis     Name Relation Home Work Dent Kara Davis Daughter [phone]  [phone]        I met face to face with patient in [facility] facility. Palliative Care was asked to follow this patient by consultation request of Kara Northern, MD to address advance care planning and complex medical decision making. This is a follow up visit.  ADVANCE CARE PLANNING/GOALS OF CARE: Review of an existing advance directive document-DNR  CODE STATUS: DNR   ASSESSMENT AND / RECOMMENDATIONS:  PPS: 50%  Dementia with anxiety FAST 7 Score 6c On no specific medications for Dementia. Has Xanax at bedtime prn for anxiety Recommend monitoring of weight as pt sleeping more and eating less. Monitor for problems with coughing/choking after eating or drinking. Encourage participation in social activities to maintain stimulation.      Follow up Palliative Care Visit:  Palliative Care continuing to follow up by monitoring for changes in appetite, weight, functional and cognitive status for chronic disease progression and management in agreement with patient's stated goals of care. Next visit in 4-8 weeks or prn.  This visit was coded based on medical decision making (MDM).  Chief Complaint  Palliative Care is continuing to follow pt for chronic medical management of symptoms in setting of dementia.  HISTORY OF PRESENT ILLNESS: Kara Davis is a 88 y.o. year old  female with Dementia. Seen laying in her bed. Denies pain, SOB, nausea/vomiting, dysuria or falls. She asked if she was able to stay for the night.  Reports daughter Kara Davis has recently had URI.  ACTIVITIES OF DAILY LIVING: CONTINENT OF BLADDER/Bowel? Yes  BATHING/DRESSING-needs minimal assistance/standby with supervision and cueing FEEDING-independent after set up  MOBILITY:  AMBULATORY WITH ASSISTIVE DEVICE: rolling walker  APPETITE? good  CURRENT PROBLEM LIST:  Patient Active Problem List   Diagnosis Date Noted   Normocytic anemia 04/26/2022   S/P total right hip arthroplasty 04/25/2022   Closed right hip fracture (HCC) 04/23/2022   Memory loss 04/23/2022   Caregiver stress 01/03/2022   Palliative care encounter 05/30/2021   Cognitive impairment 05/30/2021   Slurred speech 03/09/2021   Conductive hearing loss of both ears 09/17/2015   Dysfunction of both eustachian tubes 09/17/2015   Belching 04/20/2013   Nausea alone 11/02/2012   Constipation 11/02/2012   Dysuria 11/02/2012   Pyuria 03/20/2012   Knee pain 02/25/2012   Disturbance of skin sensation 02/25/2012   Aneurysm (HCC) 10/01/2011   Hyponatremia 03/19/2011   Anemia, unspecified 03/19/2011   Aneurysm, 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/15/2011   UTI (lower urinary tract infection) 03/15/2011   Hypothyroidism 03/13/2011   Abdominal pain, other specified site 10/12/2010   Routine general medical examination at a health care facility 08/19/2010   Encounter for long-term (current) use of other medications 08/19/2010   Gross hematuria 08/19/2010   Numbness 08/19/2010   Unspecified disorder of liver 08/19/2010   UTI 03/31/2010   CHILLS WITHOUT FEVER 03/31/2010  Abdominal pain 08/18/2009   VITAMIN D DEFICIENCY 05/23/2008   Palpitations 05/23/2008   Essential hypertension 07/27/2007   Anxiety with depression 12/06/2006   DYSPEPSIA 12/06/2006   Diverticulosis of colon 12/06/2006   Irritable bowel syndrome 12/06/2006   ABDOMINAL  PAIN, CHRONIC 12/06/2006   HYPERGLYCEMIA 12/06/2006   PAST MEDICAL HISTORY:  Active Ambulatory Problems    Diagnosis Date Noted   VITAMIN D DEFICIENCY 05/23/2008   Anxiety with depression 12/06/2006   Essential hypertension 07/27/2007   DYSPEPSIA 12/06/2006   Diverticulosis of colon 12/06/2006   Irritable bowel syndrome 12/06/2006   Palpitations 05/23/2008   ABDOMINAL PAIN, CHRONIC 12/06/2006   Abdominal pain 08/18/2009   HYPERGLYCEMIA 12/06/2006   UTI 03/31/2010   CHILLS WITHOUT FEVER 03/31/2010   Routine general medical examination at a health care facility 08/19/2010   Encounter for long-term (current) use of other medications 08/19/2010   Gross hematuria 08/19/2010   Numbness 08/19/2010   Unspecified disorder of liver 08/19/2010   Abdominal pain, other specified site 10/12/2010   Hypothyroidism 03/13/2011   Aneurysm, 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/15/2011   UTI (lower urinary tract infection) 03/15/2011   Hyponatremia 03/19/2011   Anemia, unspecified 03/19/2011   Knee pain 02/25/2012   Disturbance of skin sensation 02/25/2012   Pyuria 03/20/2012   Nausea alone 11/02/2012   Constipation 11/02/2012   Dysuria 11/02/2012   Belching 04/20/2013   Aneurysm (HCC) 10/01/2011   Conductive hearing loss of both ears 09/17/2015   Dysfunction of both eustachian tubes 09/17/2015   Slurred speech 03/09/2021   Palliative care encounter 05/30/2021   Cognitive impairment 05/30/2021   Caregiver stress 01/03/2022   Closed right hip fracture (HCC) 04/23/2022   Memory loss 04/23/2022   S/P total right hip arthroplasty 04/25/2022   Normocytic anemia 04/26/2022   Resolved Ambulatory Problems    Diagnosis Date Noted   HYPERCHOLESTEROLEMIA 07/27/2007   GERD 12/06/2006   Blurred vision, bilateral 03/13/2011   Hyponatremia 03/14/2011   Leg swelling 03/14/2011   Past Medical History:  Diagnosis Date   Cataracts, bilateral    Dementia (HCC)    DEPRESSION 12/06/2006   DIVERTICULOSIS, COLON  12/06/2006   HYPERTENSION 07/27/2007   Osteoporosis    Tubular adenoma 09/09/2009   VITAMIN D DEFICIENCY 05/23/2008     Preferred Pharmacy: ALLERGIES: No Known Allergies   PERTINENT MEDICATIONS:  Outpatient Encounter Medications as of 08/20/2022  Medication Sig   acetaminophen (TYLENOL) 500 MG tablet Take 2 tablets (1,000 mg total) by mouth 3 (three) times daily.   ALPRAZolam (XANAX) 0.25 MG tablet Take 1 tablet (0.25 mg total) by mouth at bedtime as needed for anxiety.   bisacodyl (DULCOLAX) 10 MG suppository Place 1 suppository (10 mg total) rectally daily as needed for moderate constipation.   Cholecalciferol (VITAMIN D-3) 25 MCG (1000 UT) CAPS Take 1,000 Units by mouth once a week.   docusate sodium (COLACE) 100 MG capsule Take 1 capsule (100 mg total) by mouth 2 (two) times daily.   Fe Fum-Vit C-Vit B12-FA (TRIGELS-F FORTE) CAPS capsule Take 1 capsule by mouth daily after breakfast.   Homeopathic Products (SIMILASAN DRY EYE RELIEF) SOLN Place 1 drop into both eyes as needed (dry eye).   levothyroxine (SYNTHROID) 75 MCG tablet Take 75 mcg by mouth daily before breakfast.   melatonin 5 MG TABS Take 5 mg by mouth at bedtime as needed (sleep).   methocarbamol (ROBAXIN) 500 MG tablet Take 1 tablet (500 mg total) by mouth every 8 (eight) hours as needed for muscle spasms.  oxyCODONE (OXY IR/ROXICODONE) 5 MG immediate release tablet Take 1 tablet (5 mg total) by mouth every 6 (six) hours as needed for moderate pain.   Sodium Chloride-Xylitol (XLEAR SINUS CARE SPRAY) SOLN Place 2 sprays into both nostrils as needed (dryness).   No facility-administered encounter medications on file as of 08/20/2022.    History obtained from review of EMR, discussion with daughter and/or patient.     I reviewed available labs, medications, imaging, studies and related documents from the EMR. There are no new records since last visit.   Physical Exam: GENERAL: NAD LUNGS: CTAB, no increased work of  breathing, room air CARDIAC:  S1S2, RRR with RSB murmur, No edema/cyanosis ABD:  Normo-active BS x 4 quads, soft, non-tender EXTREMITIES: Normal ROM, no deformity, strength equal, No muscle atrophy/subcutaneous fat loss NEURO:  No weakness, significant cognitive impairment, no aphasia PSYCH:  non-anxious affect, A & O x 1  Thank you for the opportunity to participate in the care of [facility]. Please call our main office at [phone] if we can be of additional assistance.    Joycelyn Man FNP-C  [email or web address] .[facility]  Phone:  [phone]

## 2022-08-24 DIAGNOSIS — G309 Alzheimer's disease, unspecified: Secondary | ICD-10-CM | POA: Insufficient documentation

## 2022-09-02 DIAGNOSIS — F419 Anxiety disorder, unspecified: Secondary | ICD-10-CM | POA: Diagnosis not present

## 2022-09-02 DIAGNOSIS — E039 Hypothyroidism, unspecified: Secondary | ICD-10-CM | POA: Diagnosis not present

## 2022-09-03 DIAGNOSIS — G4700 Insomnia, unspecified: Secondary | ICD-10-CM | POA: Diagnosis not present

## 2022-09-03 DIAGNOSIS — F419 Anxiety disorder, unspecified: Secondary | ICD-10-CM | POA: Diagnosis not present

## 2022-09-09 DIAGNOSIS — E559 Vitamin D deficiency, unspecified: Secondary | ICD-10-CM | POA: Diagnosis not present

## 2022-09-09 DIAGNOSIS — D649 Anemia, unspecified: Secondary | ICD-10-CM | POA: Diagnosis not present

## 2022-09-09 DIAGNOSIS — F419 Anxiety disorder, unspecified: Secondary | ICD-10-CM | POA: Diagnosis not present

## 2022-09-27 DIAGNOSIS — G309 Alzheimer's disease, unspecified: Secondary | ICD-10-CM | POA: Diagnosis not present

## 2022-09-27 DIAGNOSIS — I671 Cerebral aneurysm, nonruptured: Secondary | ICD-10-CM | POA: Diagnosis not present

## 2022-09-27 DIAGNOSIS — E039 Hypothyroidism, unspecified: Secondary | ICD-10-CM | POA: Diagnosis not present

## 2022-09-27 DIAGNOSIS — K589 Irritable bowel syndrome without diarrhea: Secondary | ICD-10-CM | POA: Diagnosis not present

## 2022-09-27 DIAGNOSIS — K5900 Constipation, unspecified: Secondary | ICD-10-CM | POA: Diagnosis not present

## 2022-09-27 DIAGNOSIS — F02B4 Dementia in other diseases classified elsewhere, moderate, with anxiety: Secondary | ICD-10-CM | POA: Diagnosis not present

## 2022-09-27 DIAGNOSIS — I729 Aneurysm of unspecified site: Secondary | ICD-10-CM | POA: Diagnosis not present

## 2022-09-27 DIAGNOSIS — K5730 Diverticulosis of large intestine without perforation or abscess without bleeding: Secondary | ICD-10-CM | POA: Diagnosis not present

## 2022-09-27 DIAGNOSIS — Z515 Encounter for palliative care: Secondary | ICD-10-CM | POA: Diagnosis not present

## 2022-09-27 DIAGNOSIS — I10 Essential (primary) hypertension: Secondary | ICD-10-CM | POA: Diagnosis not present

## 2022-09-30 DIAGNOSIS — E039 Hypothyroidism, unspecified: Secondary | ICD-10-CM | POA: Diagnosis not present

## 2022-09-30 DIAGNOSIS — F411 Generalized anxiety disorder: Secondary | ICD-10-CM | POA: Diagnosis not present

## 2022-10-01 DIAGNOSIS — G4700 Insomnia, unspecified: Secondary | ICD-10-CM | POA: Diagnosis not present

## 2022-10-01 DIAGNOSIS — F411 Generalized anxiety disorder: Secondary | ICD-10-CM | POA: Diagnosis not present

## 2022-10-05 DIAGNOSIS — E039 Hypothyroidism, unspecified: Secondary | ICD-10-CM | POA: Diagnosis not present

## 2022-10-05 DIAGNOSIS — K5903 Drug induced constipation: Secondary | ICD-10-CM | POA: Diagnosis not present

## 2022-10-05 DIAGNOSIS — G4700 Insomnia, unspecified: Secondary | ICD-10-CM | POA: Diagnosis not present

## 2022-10-29 DIAGNOSIS — G4700 Insomnia, unspecified: Secondary | ICD-10-CM | POA: Diagnosis not present

## 2022-10-29 DIAGNOSIS — F419 Anxiety disorder, unspecified: Secondary | ICD-10-CM | POA: Diagnosis not present

## 2022-11-03 DIAGNOSIS — D500 Iron deficiency anemia secondary to blood loss (chronic): Secondary | ICD-10-CM | POA: Diagnosis not present

## 2022-11-03 DIAGNOSIS — F419 Anxiety disorder, unspecified: Secondary | ICD-10-CM | POA: Diagnosis not present

## 2022-11-04 DIAGNOSIS — E559 Vitamin D deficiency, unspecified: Secondary | ICD-10-CM | POA: Diagnosis not present

## 2022-11-04 DIAGNOSIS — R159 Full incontinence of feces: Secondary | ICD-10-CM | POA: Diagnosis not present

## 2022-11-04 DIAGNOSIS — F419 Anxiety disorder, unspecified: Secondary | ICD-10-CM | POA: Diagnosis not present

## 2022-11-04 DIAGNOSIS — D649 Anemia, unspecified: Secondary | ICD-10-CM | POA: Diagnosis not present

## 2022-11-04 DIAGNOSIS — R32 Unspecified urinary incontinence: Secondary | ICD-10-CM | POA: Diagnosis not present

## 2022-11-19 DIAGNOSIS — G309 Alzheimer's disease, unspecified: Secondary | ICD-10-CM | POA: Diagnosis not present

## 2022-11-19 DIAGNOSIS — G4700 Insomnia, unspecified: Secondary | ICD-10-CM | POA: Diagnosis not present

## 2022-11-19 DIAGNOSIS — R32 Unspecified urinary incontinence: Secondary | ICD-10-CM | POA: Diagnosis not present

## 2022-11-19 DIAGNOSIS — N390 Urinary tract infection, site not specified: Secondary | ICD-10-CM | POA: Diagnosis not present

## 2022-11-19 DIAGNOSIS — F411 Generalized anxiety disorder: Secondary | ICD-10-CM | POA: Diagnosis not present

## 2022-11-19 DIAGNOSIS — F0283 Dementia in other diseases classified elsewhere, unspecified severity, with mood disturbance: Secondary | ICD-10-CM | POA: Diagnosis not present

## 2022-11-29 DIAGNOSIS — I671 Cerebral aneurysm, nonruptured: Secondary | ICD-10-CM | POA: Diagnosis not present

## 2022-11-29 DIAGNOSIS — K5730 Diverticulosis of large intestine without perforation or abscess without bleeding: Secondary | ICD-10-CM | POA: Diagnosis not present

## 2022-11-29 DIAGNOSIS — F02B4 Dementia in other diseases classified elsewhere, moderate, with anxiety: Secondary | ICD-10-CM | POA: Diagnosis not present

## 2022-11-29 DIAGNOSIS — I729 Aneurysm of unspecified site: Secondary | ICD-10-CM | POA: Diagnosis not present

## 2022-11-29 DIAGNOSIS — E039 Hypothyroidism, unspecified: Secondary | ICD-10-CM | POA: Diagnosis not present

## 2022-11-29 DIAGNOSIS — Z515 Encounter for palliative care: Secondary | ICD-10-CM | POA: Diagnosis not present

## 2022-11-29 DIAGNOSIS — G309 Alzheimer's disease, unspecified: Secondary | ICD-10-CM | POA: Diagnosis not present

## 2022-11-29 DIAGNOSIS — M2550 Pain in unspecified joint: Secondary | ICD-10-CM | POA: Diagnosis not present

## 2022-11-29 DIAGNOSIS — I10 Essential (primary) hypertension: Secondary | ICD-10-CM | POA: Diagnosis not present

## 2022-11-29 DIAGNOSIS — K589 Irritable bowel syndrome without diarrhea: Secondary | ICD-10-CM | POA: Diagnosis not present

## 2022-11-29 DIAGNOSIS — D649 Anemia, unspecified: Secondary | ICD-10-CM | POA: Diagnosis not present

## 2022-11-29 DIAGNOSIS — K5900 Constipation, unspecified: Secondary | ICD-10-CM | POA: Diagnosis not present

## 2022-12-01 DIAGNOSIS — R32 Unspecified urinary incontinence: Secondary | ICD-10-CM | POA: Diagnosis not present

## 2022-12-01 DIAGNOSIS — F419 Anxiety disorder, unspecified: Secondary | ICD-10-CM | POA: Diagnosis not present

## 2022-12-01 DIAGNOSIS — D500 Iron deficiency anemia secondary to blood loss (chronic): Secondary | ICD-10-CM | POA: Diagnosis not present

## 2022-12-01 DIAGNOSIS — R159 Full incontinence of feces: Secondary | ICD-10-CM | POA: Diagnosis not present

## 2022-12-01 DIAGNOSIS — E559 Vitamin D deficiency, unspecified: Secondary | ICD-10-CM | POA: Diagnosis not present

## 2022-12-02 DIAGNOSIS — E039 Hypothyroidism, unspecified: Secondary | ICD-10-CM | POA: Diagnosis not present

## 2022-12-02 DIAGNOSIS — R300 Dysuria: Secondary | ICD-10-CM | POA: Diagnosis not present

## 2022-12-02 DIAGNOSIS — F411 Generalized anxiety disorder: Secondary | ICD-10-CM | POA: Diagnosis not present

## 2022-12-02 DIAGNOSIS — K5900 Constipation, unspecified: Secondary | ICD-10-CM | POA: Diagnosis not present

## 2022-12-02 DIAGNOSIS — N390 Urinary tract infection, site not specified: Secondary | ICD-10-CM | POA: Diagnosis not present

## 2022-12-17 DIAGNOSIS — G309 Alzheimer's disease, unspecified: Secondary | ICD-10-CM | POA: Diagnosis not present

## 2022-12-17 DIAGNOSIS — G4700 Insomnia, unspecified: Secondary | ICD-10-CM | POA: Diagnosis not present

## 2022-12-17 DIAGNOSIS — F411 Generalized anxiety disorder: Secondary | ICD-10-CM | POA: Diagnosis not present

## 2022-12-17 DIAGNOSIS — F0283 Dementia in other diseases classified elsewhere, unspecified severity, with mood disturbance: Secondary | ICD-10-CM | POA: Diagnosis not present

## 2022-12-28 DIAGNOSIS — G4700 Insomnia, unspecified: Secondary | ICD-10-CM | POA: Diagnosis not present

## 2022-12-28 DIAGNOSIS — D509 Iron deficiency anemia, unspecified: Secondary | ICD-10-CM | POA: Diagnosis not present

## 2022-12-28 DIAGNOSIS — M6281 Muscle weakness (generalized): Secondary | ICD-10-CM | POA: Diagnosis not present

## 2022-12-31 DIAGNOSIS — F419 Anxiety disorder, unspecified: Secondary | ICD-10-CM | POA: Diagnosis not present

## 2022-12-31 DIAGNOSIS — D500 Iron deficiency anemia secondary to blood loss (chronic): Secondary | ICD-10-CM | POA: Diagnosis not present

## 2023-01-14 DIAGNOSIS — F0283 Dementia in other diseases classified elsewhere, unspecified severity, with mood disturbance: Secondary | ICD-10-CM | POA: Diagnosis not present

## 2023-01-14 DIAGNOSIS — G309 Alzheimer's disease, unspecified: Secondary | ICD-10-CM | POA: Diagnosis not present

## 2023-01-14 DIAGNOSIS — F411 Generalized anxiety disorder: Secondary | ICD-10-CM | POA: Diagnosis not present

## 2023-01-21 DIAGNOSIS — Z515 Encounter for palliative care: Secondary | ICD-10-CM | POA: Diagnosis not present

## 2023-01-21 DIAGNOSIS — F02B4 Dementia in other diseases classified elsewhere, moderate, with anxiety: Secondary | ICD-10-CM | POA: Diagnosis not present

## 2023-01-21 DIAGNOSIS — R531 Weakness: Secondary | ICD-10-CM | POA: Diagnosis not present

## 2023-01-21 DIAGNOSIS — E039 Hypothyroidism, unspecified: Secondary | ICD-10-CM | POA: Diagnosis not present

## 2023-01-21 DIAGNOSIS — I10 Essential (primary) hypertension: Secondary | ICD-10-CM | POA: Diagnosis not present

## 2023-01-21 DIAGNOSIS — F411 Generalized anxiety disorder: Secondary | ICD-10-CM | POA: Diagnosis not present

## 2023-01-21 DIAGNOSIS — K5730 Diverticulosis of large intestine without perforation or abscess without bleeding: Secondary | ICD-10-CM | POA: Diagnosis not present

## 2023-01-21 DIAGNOSIS — I671 Cerebral aneurysm, nonruptured: Secondary | ICD-10-CM | POA: Diagnosis not present

## 2023-01-21 DIAGNOSIS — K5900 Constipation, unspecified: Secondary | ICD-10-CM | POA: Diagnosis not present

## 2023-01-21 DIAGNOSIS — M2550 Pain in unspecified joint: Secondary | ICD-10-CM | POA: Diagnosis not present

## 2023-01-21 DIAGNOSIS — G309 Alzheimer's disease, unspecified: Secondary | ICD-10-CM | POA: Diagnosis not present

## 2023-01-21 DIAGNOSIS — I729 Aneurysm of unspecified site: Secondary | ICD-10-CM | POA: Diagnosis not present

## 2023-01-25 DIAGNOSIS — G4700 Insomnia, unspecified: Secondary | ICD-10-CM | POA: Diagnosis not present

## 2023-01-25 DIAGNOSIS — E039 Hypothyroidism, unspecified: Secondary | ICD-10-CM | POA: Diagnosis not present

## 2023-01-25 DIAGNOSIS — K5900 Constipation, unspecified: Secondary | ICD-10-CM | POA: Diagnosis not present

## 2023-02-02 DIAGNOSIS — G309 Alzheimer's disease, unspecified: Secondary | ICD-10-CM | POA: Diagnosis not present

## 2023-02-02 DIAGNOSIS — K5900 Constipation, unspecified: Secondary | ICD-10-CM | POA: Diagnosis not present

## 2023-02-02 DIAGNOSIS — K5730 Diverticulosis of large intestine without perforation or abscess without bleeding: Secondary | ICD-10-CM | POA: Diagnosis not present

## 2023-02-02 DIAGNOSIS — R634 Abnormal weight loss: Secondary | ICD-10-CM | POA: Diagnosis not present

## 2023-02-02 DIAGNOSIS — I729 Aneurysm of unspecified site: Secondary | ICD-10-CM | POA: Diagnosis not present

## 2023-02-02 DIAGNOSIS — R531 Weakness: Secondary | ICD-10-CM | POA: Diagnosis not present

## 2023-02-02 DIAGNOSIS — Y844 Aspiration of fluid as the cause of abnormal reaction of the patient, or of later complication, without mention of misadventure at the time of the procedure: Secondary | ICD-10-CM | POA: Diagnosis not present

## 2023-02-02 DIAGNOSIS — F02B4 Dementia in other diseases classified elsewhere, moderate, with anxiety: Secondary | ICD-10-CM | POA: Diagnosis not present

## 2023-02-02 DIAGNOSIS — F411 Generalized anxiety disorder: Secondary | ICD-10-CM | POA: Diagnosis not present

## 2023-02-02 DIAGNOSIS — D500 Iron deficiency anemia secondary to blood loss (chronic): Secondary | ICD-10-CM | POA: Diagnosis not present

## 2023-02-02 DIAGNOSIS — Z515 Encounter for palliative care: Secondary | ICD-10-CM | POA: Diagnosis not present

## 2023-02-02 DIAGNOSIS — I671 Cerebral aneurysm, nonruptured: Secondary | ICD-10-CM | POA: Diagnosis not present

## 2023-02-02 DIAGNOSIS — F419 Anxiety disorder, unspecified: Secondary | ICD-10-CM | POA: Diagnosis not present

## 2023-02-02 DIAGNOSIS — M2550 Pain in unspecified joint: Secondary | ICD-10-CM | POA: Diagnosis not present

## 2023-02-08 DIAGNOSIS — B351 Tinea unguium: Secondary | ICD-10-CM | POA: Diagnosis not present

## 2023-02-08 DIAGNOSIS — M2041 Other hammer toe(s) (acquired), right foot: Secondary | ICD-10-CM | POA: Diagnosis not present

## 2023-02-11 DIAGNOSIS — F0283 Dementia in other diseases classified elsewhere, unspecified severity, with mood disturbance: Secondary | ICD-10-CM | POA: Diagnosis not present

## 2023-02-11 DIAGNOSIS — F411 Generalized anxiety disorder: Secondary | ICD-10-CM | POA: Diagnosis not present

## 2023-02-11 DIAGNOSIS — G309 Alzheimer's disease, unspecified: Secondary | ICD-10-CM | POA: Diagnosis not present

## 2023-12-14 IMAGING — CR DG ABDOMEN ACUTE W/ 1V CHEST
3 series · 3 of 3 positions shown · non-contrast
Comparison: None.

CLINICAL DATA: Abdominal pain, fall this morning

EXAM:
DG ABDOMEN ACUTE WITH 1 VIEW CHEST

[x chest ap]
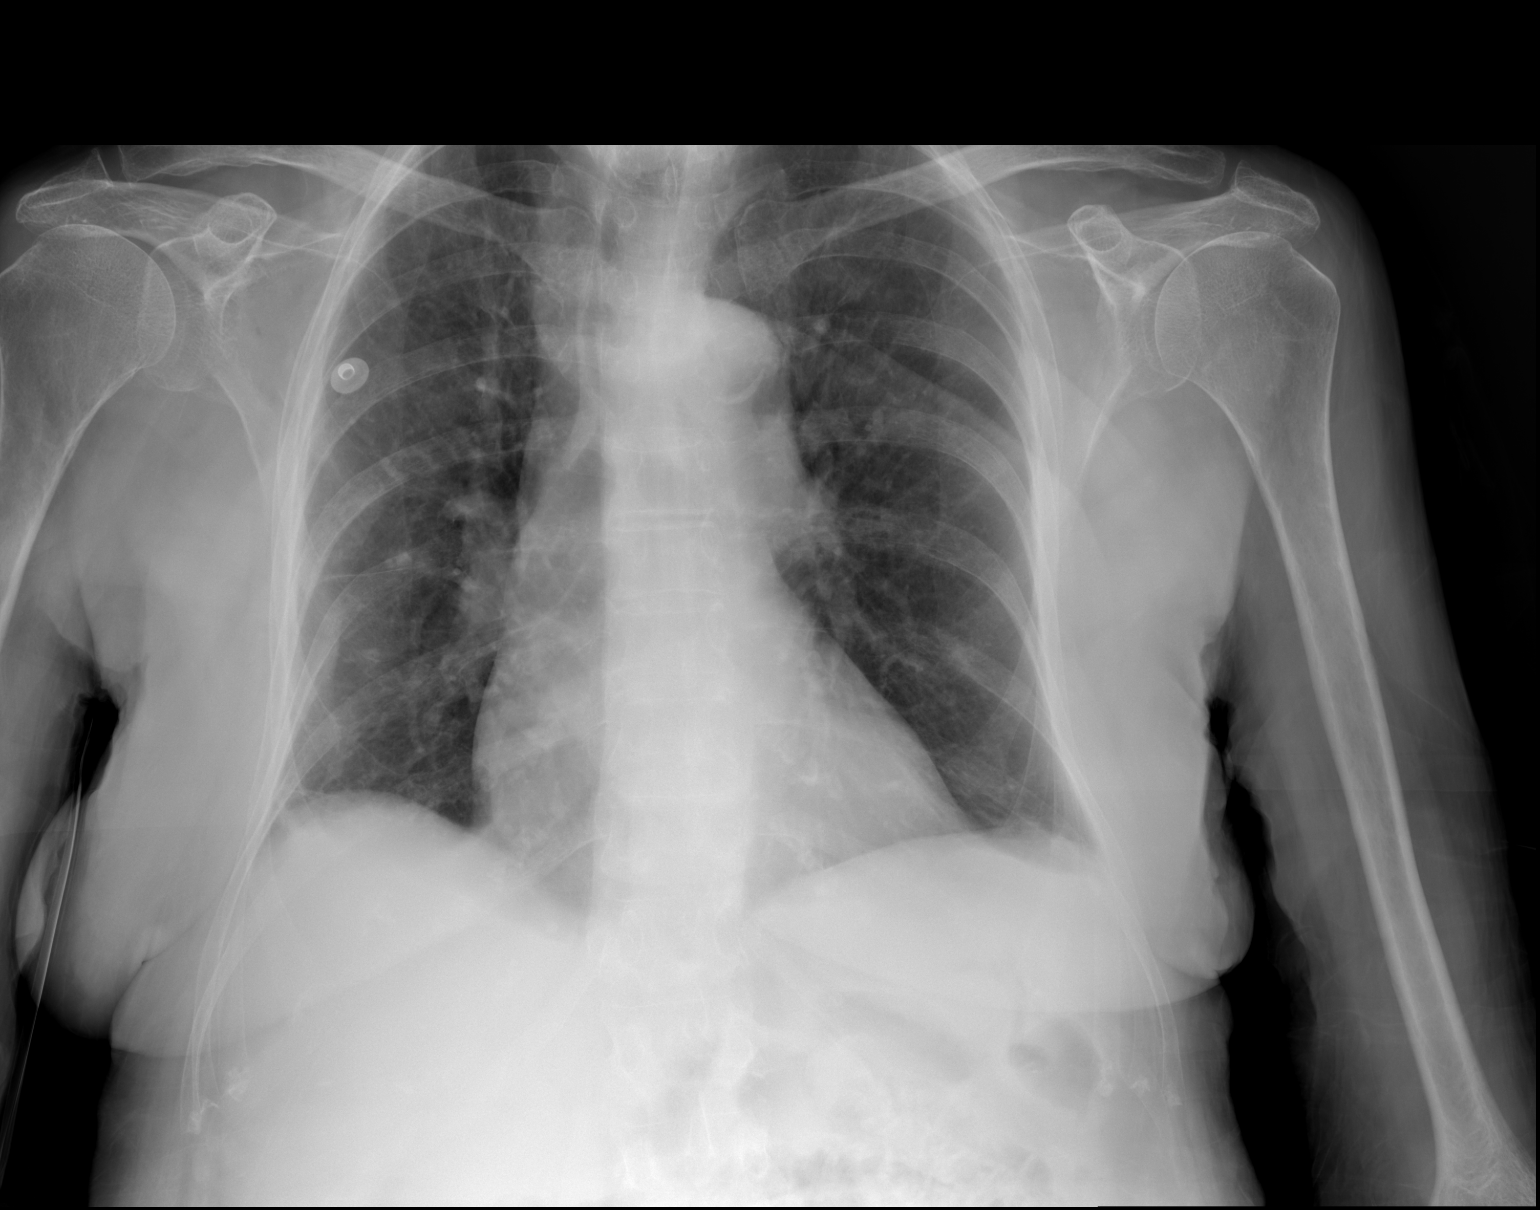

[x abdomen supine (1 of 2)]
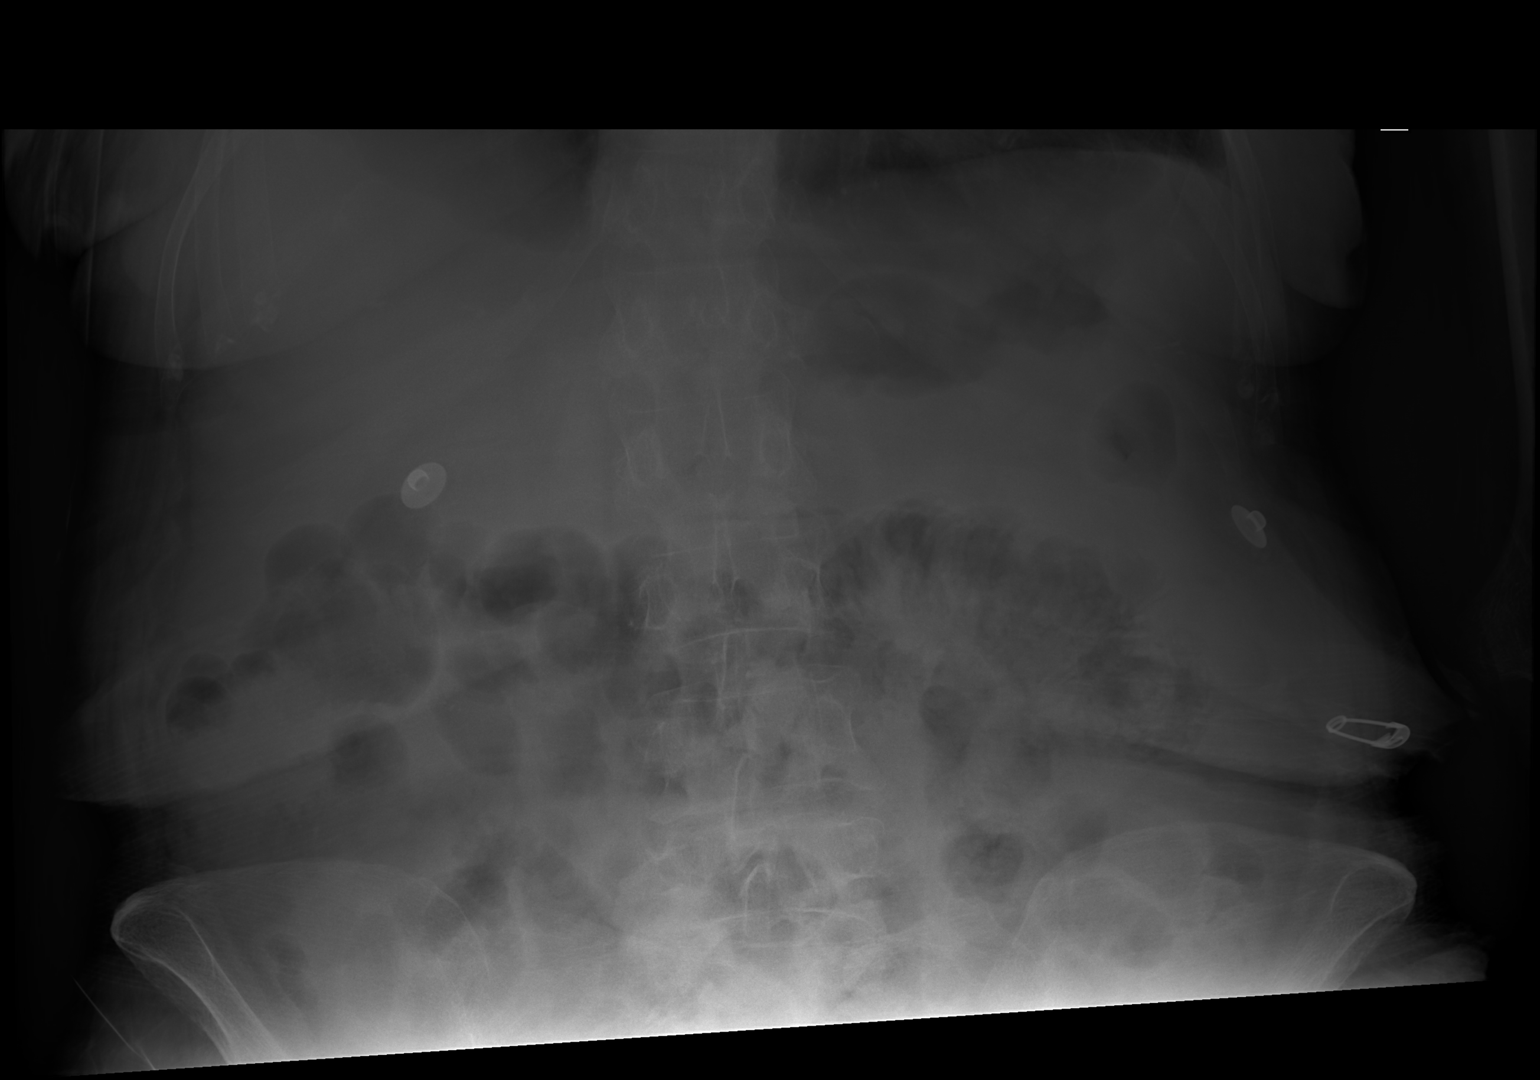

[x abdomen supine (2 of 2)]
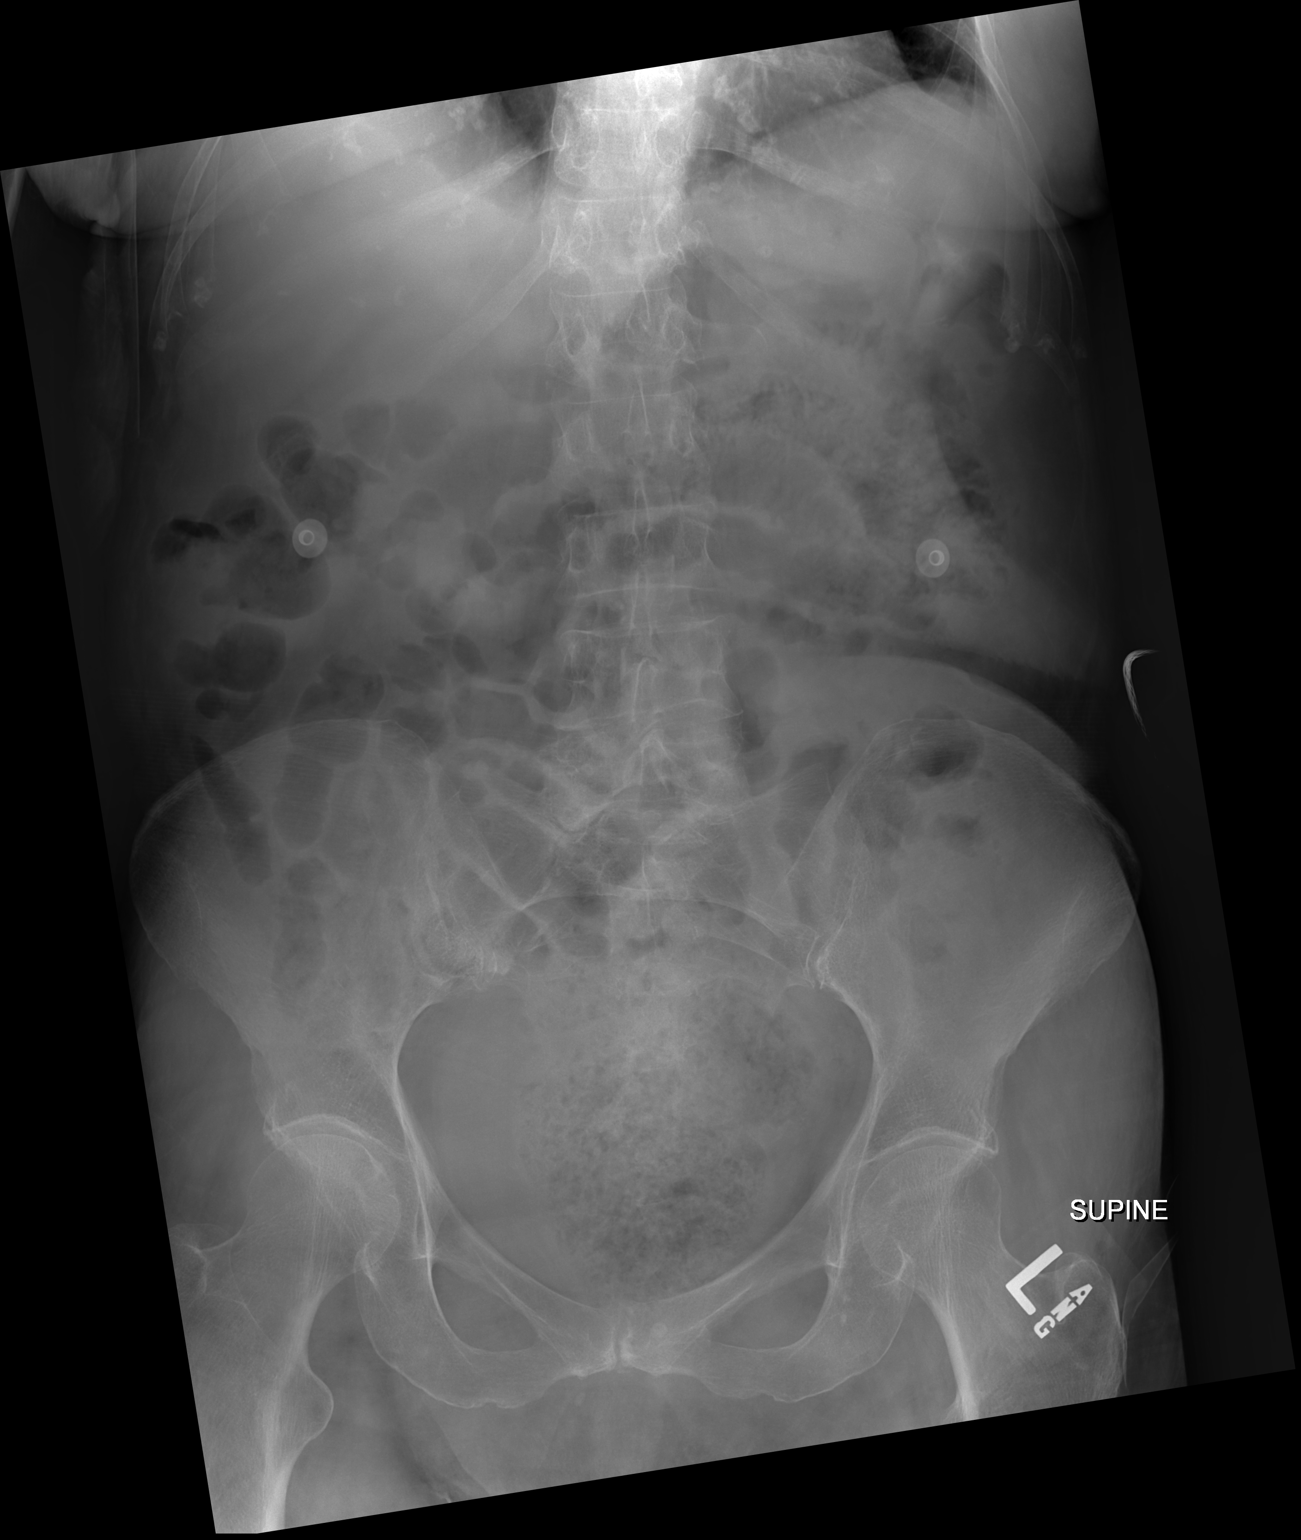

[3 of 3 positions shown; findings below may reference images not displayed]

FINDINGS: Gas-filled, nondistended loops of bowel throughout the abdomen and
pelvis, with gas and stool present to the rectum. Moderate burden of
stool in the distal colon. No radiopaque calculi or other
significant radiographic abnormality is seen. Mild cardiomegaly.
Both lungs are clear.
IMPRESSION: 1.  No acute abnormality of the lungs in AP portable projection.

2. Nonobstructive pattern of bowel gas with gas and stool present to
the rectum. Moderate burden of stool in distal colon. No free air.

## 2023-12-14 IMAGING — CR DG HIP (WITH OR WITHOUT PELVIS) 2V BILAT
5 series · 5 of 5 positions shown · non-contrast
Comparison: No priors.

CLINICAL DATA: 87-year-old female with history of trauma from a
fall presenting with left hip pain.

EXAM:
DG HIP (WITH OR WITHOUT PELVIS) 2V BILAT

[x pelvis]
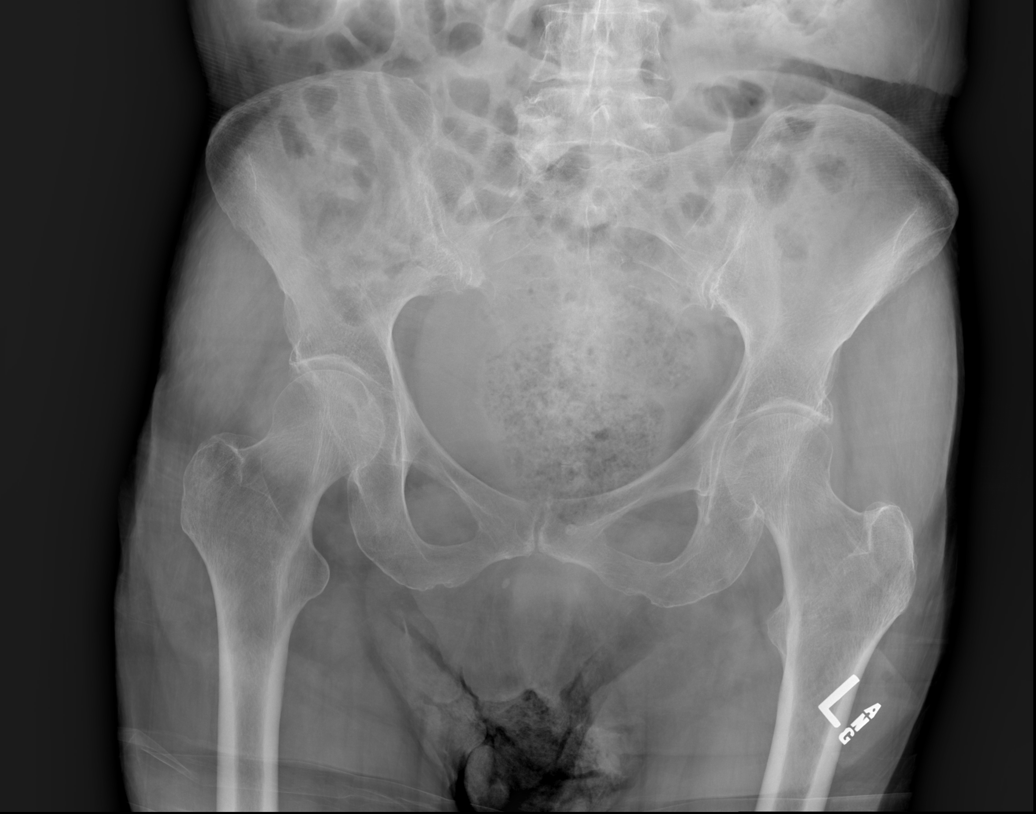

[x hip ap left]
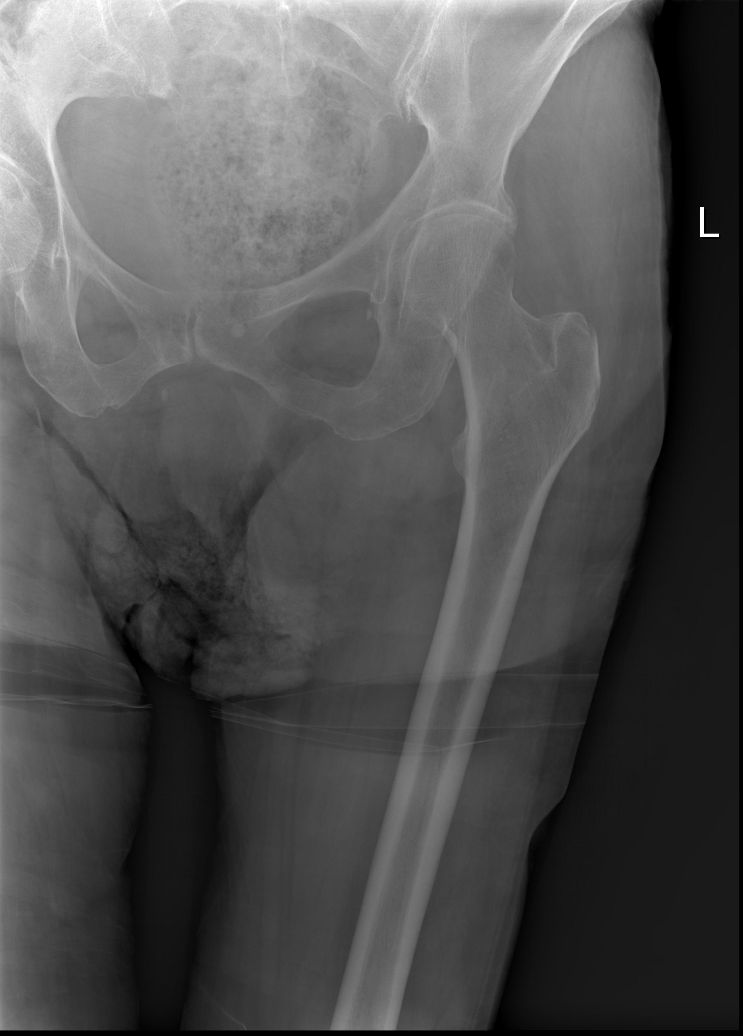

[x hip ap right (1 of 2)]
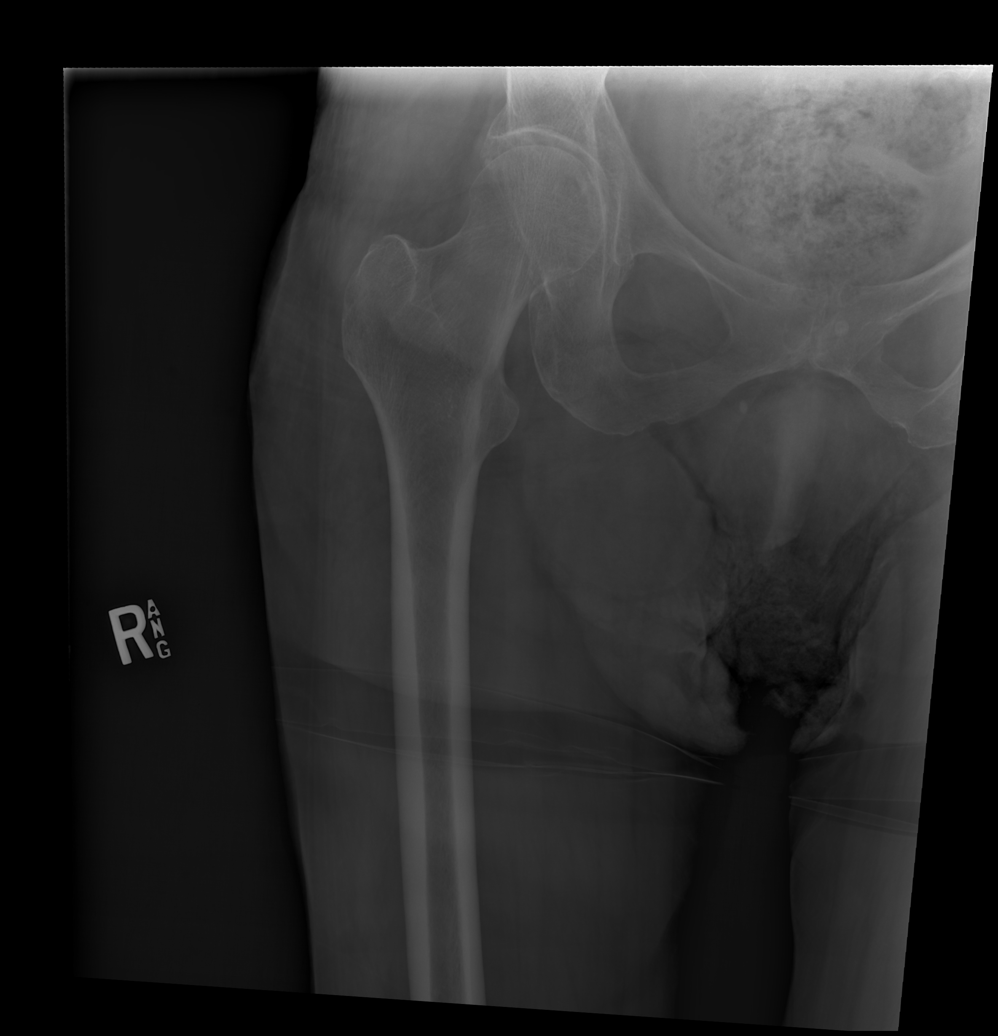

[x hip ap right (2 of 2)]
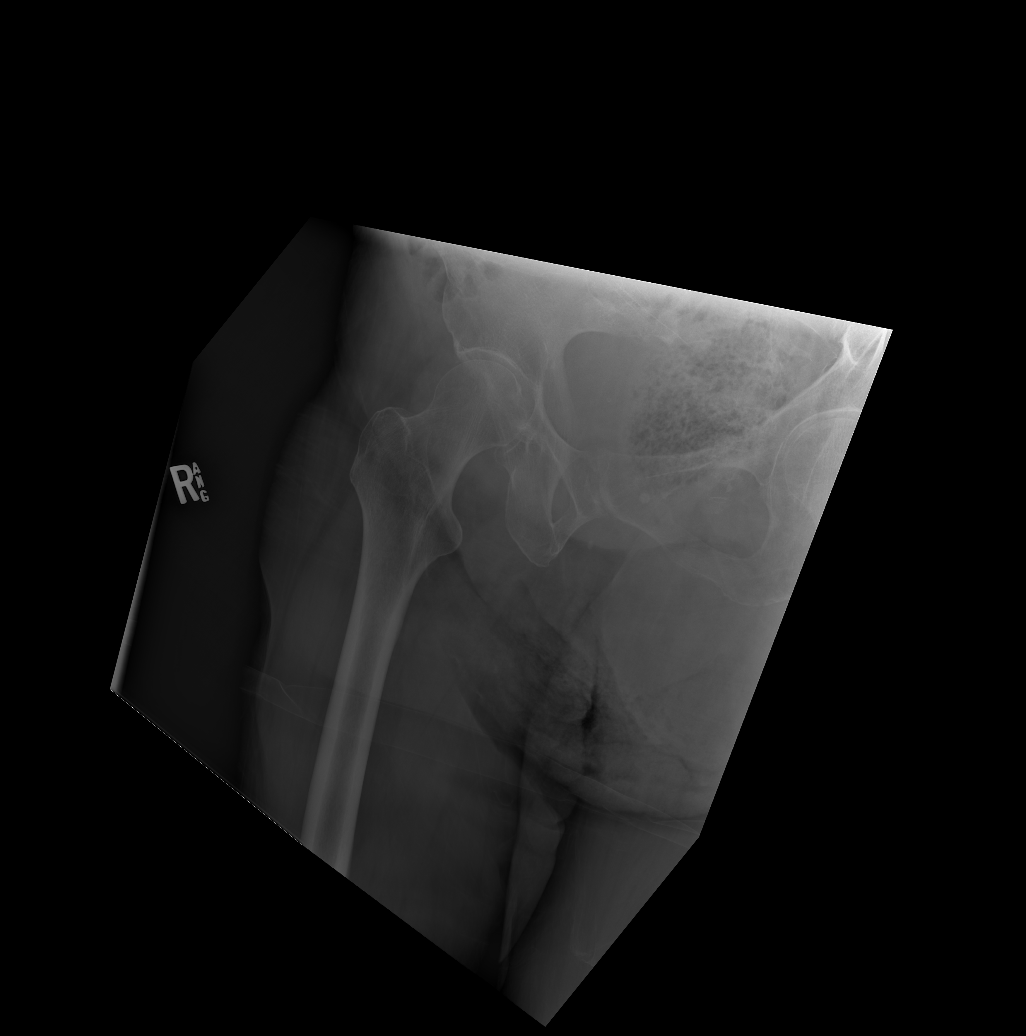

[x hip lat left]
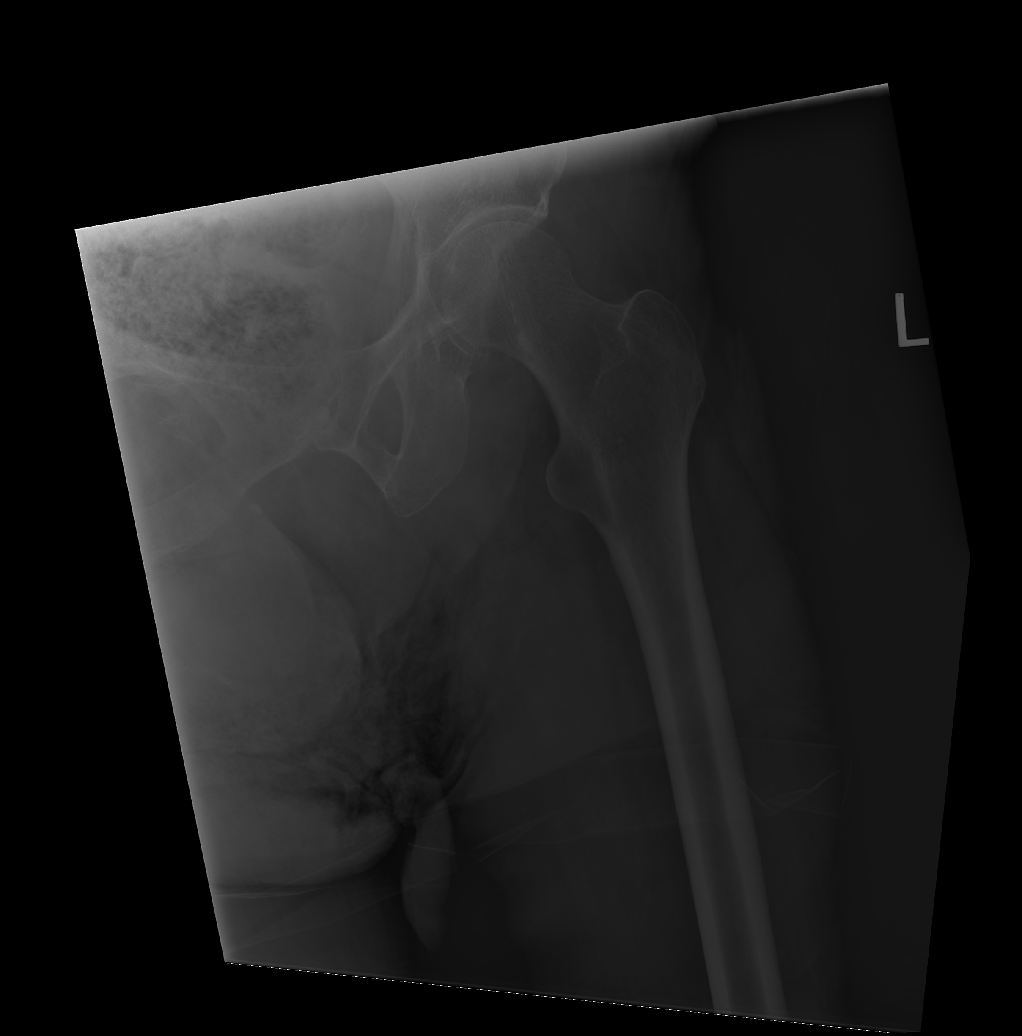

[5 of 5 positions shown; findings below may reference images not displayed]

FINDINGS: There is no evidence of hip fracture or dislocation. Joint space
narrowing, subchondral sclerosis, subchondral cyst formation and
osteophyte formation is noted in the hip joints bilaterally,
indicative of osteoarthritis.
IMPRESSION: 1. No acute radiographic abnormality of the bony pelvis or either
hip.
2. Moderate bilateral hip joint osteoarthritis.
